# Patient Record
Sex: Female | Born: 1935 | Race: Black or African American | Hispanic: No | Marital: Single | Smoking: Never smoker
Health system: Southern US, Community
[De-identification: ages and names within clinical notes are randomized; demographics above are authoritative.]

## PROBLEM LIST (undated history)

## (undated) DIAGNOSIS — I5032 Chronic diastolic (congestive) heart failure: Secondary | ICD-10-CM

## (undated) DIAGNOSIS — I272 Pulmonary hypertension, unspecified: Secondary | ICD-10-CM

## (undated) DIAGNOSIS — E876 Hypokalemia: Secondary | ICD-10-CM

## (undated) DIAGNOSIS — D638 Anemia in other chronic diseases classified elsewhere: Secondary | ICD-10-CM

## (undated) DIAGNOSIS — C50919 Malignant neoplasm of unspecified site of unspecified female breast: Secondary | ICD-10-CM

## (undated) DIAGNOSIS — E785 Hyperlipidemia, unspecified: Secondary | ICD-10-CM

## (undated) DIAGNOSIS — R600 Localized edema: Secondary | ICD-10-CM

## (undated) DIAGNOSIS — I10 Essential (primary) hypertension: Secondary | ICD-10-CM

## (undated) DIAGNOSIS — I482 Chronic atrial fibrillation, unspecified: Secondary | ICD-10-CM

## (undated) DIAGNOSIS — C50219 Malignant neoplasm of upper-inner quadrant of unspecified female breast: Secondary | ICD-10-CM

## (undated) DIAGNOSIS — E040 Nontoxic diffuse goiter: Secondary | ICD-10-CM

## (undated) DIAGNOSIS — E039 Hypothyroidism, unspecified: Secondary | ICD-10-CM

## (undated) DIAGNOSIS — E559 Vitamin D deficiency, unspecified: Secondary | ICD-10-CM

## (undated) DIAGNOSIS — N184 Chronic kidney disease, stage 4 (severe): Secondary | ICD-10-CM

## (undated) DIAGNOSIS — IMO0002 Reserved for concepts with insufficient information to code with codable children: Secondary | ICD-10-CM

## (undated) DIAGNOSIS — K649 Unspecified hemorrhoids: Secondary | ICD-10-CM

## (undated) DIAGNOSIS — M1990 Unspecified osteoarthritis, unspecified site: Secondary | ICD-10-CM

## (undated) HISTORY — PX: BREAST LUMPECTOMY: SHX2

## (undated) HISTORY — PX: CARDIOVERSION: SHX1299

## (undated) HISTORY — DX: Malignant neoplasm of upper-inner quadrant of unspecified female breast: C50.219

## (undated) HISTORY — DX: Hypothyroidism, unspecified: E03.9

## (undated) HISTORY — DX: Chronic atrial fibrillation, unspecified: I48.20

## (undated) HISTORY — DX: Hyperlipidemia, unspecified: E78.5

## (undated) HISTORY — DX: Unspecified hemorrhoids: K64.9

## (undated) HISTORY — DX: Pulmonary hypertension, unspecified: I27.20

## (undated) HISTORY — DX: Chronic diastolic (congestive) heart failure: I50.32

## (undated) HISTORY — DX: Chronic kidney disease, stage 4 (severe): N18.4

## (undated) HISTORY — DX: Nontoxic diffuse goiter: E04.0

## (undated) HISTORY — DX: Essential (primary) hypertension: I10

## (undated) HISTORY — DX: Unspecified osteoarthritis, unspecified site: M19.90

## (undated) HISTORY — DX: Malignant neoplasm of unspecified site of unspecified female breast: C50.919

## (undated) HISTORY — DX: Anemia in other chronic diseases classified elsewhere: D63.8

## (undated) HISTORY — DX: Vitamin D deficiency, unspecified: E55.9

## (undated) HISTORY — DX: Reserved for concepts with insufficient information to code with codable children: IMO0002

## (undated) HISTORY — DX: Hypokalemia: E87.6

## (undated) HISTORY — DX: Localized edema: R60.0

---

## 1958-06-25 DIAGNOSIS — I10 Essential (primary) hypertension: Secondary | ICD-10-CM

## 1958-06-25 HISTORY — DX: Essential (primary) hypertension: I10

## 1978-06-25 DIAGNOSIS — IMO0002 Reserved for concepts with insufficient information to code with codable children: Secondary | ICD-10-CM

## 1978-06-25 HISTORY — DX: Reserved for concepts with insufficient information to code with codable children: IMO0002

## 1988-06-25 DIAGNOSIS — M1990 Unspecified osteoarthritis, unspecified site: Secondary | ICD-10-CM

## 1988-06-25 HISTORY — DX: Unspecified osteoarthritis, unspecified site: M19.90

## 2004-04-29 ENCOUNTER — Ambulatory Visit: Payer: Self-pay | Admitting: Internal Medicine

## 2005-01-24 ENCOUNTER — Ambulatory Visit: Payer: Self-pay | Admitting: Obstetrics and Gynecology

## 2006-02-04 ENCOUNTER — Ambulatory Visit: Payer: Self-pay | Admitting: Family

## 2006-07-15 ENCOUNTER — Ambulatory Visit: Payer: Self-pay | Admitting: Internal Medicine

## 2007-04-10 ENCOUNTER — Ambulatory Visit: Payer: Self-pay | Admitting: Internal Medicine

## 2008-01-23 ENCOUNTER — Ambulatory Visit (HOSPITAL_COMMUNITY): Admission: RE | Admit: 2008-01-23 | Discharge: 2008-01-23 | Payer: Self-pay | Admitting: *Deleted

## 2008-04-12 ENCOUNTER — Ambulatory Visit: Payer: Self-pay | Admitting: Internal Medicine

## 2008-04-15 ENCOUNTER — Ambulatory Visit: Payer: Self-pay | Admitting: Internal Medicine

## 2008-04-16 ENCOUNTER — Ambulatory Visit: Payer: Self-pay | Admitting: Internal Medicine

## 2008-04-28 ENCOUNTER — Ambulatory Visit: Payer: Self-pay | Admitting: Internal Medicine

## 2008-07-12 ENCOUNTER — Ambulatory Visit: Payer: Self-pay | Admitting: Gastroenterology

## 2008-10-28 ENCOUNTER — Ambulatory Visit: Payer: Self-pay | Admitting: Internal Medicine

## 2009-06-25 HISTORY — PX: COLONOSCOPY: SHX174

## 2010-06-25 DIAGNOSIS — C50919 Malignant neoplasm of unspecified site of unspecified female breast: Secondary | ICD-10-CM

## 2010-06-25 HISTORY — PX: CARDIAC CATHETERIZATION: SHX172

## 2010-06-25 HISTORY — DX: Malignant neoplasm of unspecified site of unspecified female breast: C50.919

## 2010-06-25 HISTORY — PX: BREAST LUMPECTOMY: SHX2

## 2010-07-12 ENCOUNTER — Ambulatory Visit: Payer: Self-pay | Admitting: Cardiovascular Disease

## 2010-09-27 ENCOUNTER — Ambulatory Visit: Payer: Self-pay | Admitting: Internal Medicine

## 2010-10-03 ENCOUNTER — Ambulatory Visit: Payer: Self-pay | Admitting: Internal Medicine

## 2010-11-06 ENCOUNTER — Ambulatory Visit: Payer: Self-pay | Admitting: General Surgery

## 2010-11-07 ENCOUNTER — Ambulatory Visit: Payer: Self-pay | Admitting: General Surgery

## 2010-11-07 NOTE — Op Note (Signed)
Teresa Lutz, Teresa Lutz              ACCOUNT NO.:  [id number]   MEDICAL RECORD NO.:  [id number]          PATIENT TYPE:  AMB   LOCATION:  SDS                          FACILITY:  [facility]   PHYSICIAN:  Donnel Saxon, MD    DATE OF BIRTH:  1935/04/13   DATE OF PROCEDURE:  01/23/2008  DATE OF DISCHARGE:  01/23/2008                               OPERATIVE REPORT   PREOPERATIVE DIAGNOSIS:  Retained lens material right eye.   POSTOPERATIVE DIAGNOSIS:  Retained lens material right eye.   PROCEDURES:  1. Pars plana vitrectomy with pars plana lensectomy right eye.  2. Peripheral laser demarcation, peripheral axis of retinal detachment      right eye.   SURGEON:  Barbara Cower B. Allyne Gee, MD   ANESTHESIA:  Local MAC and retrobulbar injection in the right orbit.   FINDINGS:  There was retained lens material on the vitreous cavity of  the right eye.   SPECIMENS:  None.   COMPLICATIONS:  None.   DESCRIPTION OF THE PROCEDURE:  The patient was identified in the  preoperative holding area, and she was then taken to the operating room  where she was sedated by anesthesia.  At that point of time, the right  orbit was anesthetized using retrobulbar injection of 1:1 mixture of  0.75% bupivacaine and 1% lidocaine with 150 units of Amphadase.  After  excellent akinesia and anesthesia were obtained of the right eye, the  right eye was prepped and draped in usual sterile fashion for ocular  surgery including trimming of lashes. A Lieberman speculum was placed  between the right upper and lower eyelids for exposure.  A conjunctival  peritomy was performed with 0.3 forceps and Westcott scissors to prepare  the eye for stated 3-port 20-gauge vitrectomy.  Hemostasis was obtained  with bipolar cautery.  A Cryoptor was set at 3.5 mm and used to mark the  locations of sclerotomies in the inferior temple, superior temple, and  superior nasal quadrants.  A 7-0 Vicryl suture was placed in the  inferior temporal  sclerotomy location to prepare the eyelid to  support  an infusion cannula.  An microvitreoretinal blade was used to create a  sclerotomy in the inferior temporal oblique quadrant and the MVR blade  was then removed.  The infusion cannula was then placed in this location  and verified to be in the vitreous cavity prior to its use. It was  secured with a prior placed 7-0 Vicryl suture.  Superior sclerotomies  were also created in the MVR blade.  A core vitrectomy was then  performed with a vitreous cutter and a light pipe.  The BIOM was used to  visualize the vitreous during this dissection.  The hyaloid was elevated  and removed.  Peripheral lens material was then removed using the  Fragmatome.  After all lens material was removed, the eye was inspected  with an indirect ophthalmoscopy.  No holes, tears, or subretinal fluid  were seen; however to reduce the incidence of rhegmatogenous disease,  the peripheral retina was treated with laser demarcation.  At that time,  with no other  problems being seen, all instruments were removed from the  eye and the sclerotomies were closed with 7-0 Vicryl suture in a figure-  of-eight knot.  The infusion cannula was removed and this sclerotomy was  closed in similar fashion.  Tenon capsule and conjunctiva were then  reapproximated to the limbus using 7-0 Vicryl suture in an interrupted  fashion.  The eye was then treated with subconjunctival injections of 4  mg of Decadron and 25 mg of Ancef.  The eye was then treated  with 1 drop of 1% atropine and TobraDex ointment.  The speculum were  removed.  The eyelids were cleaned and closed.  The eye was then patched  and shielded.  The patient was taken to the recovery in excellent  condition having tolerated the procedure very well.      Donnel Saxon, MD  Electronically Signed     JBS/MEDQ  D:  02/27/2008  T:  02/28/2008  Job:  [id number]

## 2010-11-20 ENCOUNTER — Ambulatory Visit: Payer: Self-pay | Admitting: Radiation Oncology

## 2010-11-24 ENCOUNTER — Ambulatory Visit: Payer: Self-pay | Admitting: Radiation Oncology

## 2010-11-24 HISTORY — PX: OTHER SURGICAL HISTORY: SHX169

## 2010-11-29 ENCOUNTER — Ambulatory Visit: Payer: Self-pay | Admitting: Radiation Oncology

## 2010-12-24 ENCOUNTER — Ambulatory Visit: Payer: Self-pay | Admitting: Radiation Oncology

## 2011-03-23 LAB — BASIC METABOLIC PANEL
BUN: 33 — ABNORMAL HIGH
Creatinine, Ser: 1.8 — ABNORMAL HIGH
GFR calc non Af Amer: 28 — ABNORMAL LOW
Glucose, Bld: 107 — ABNORMAL HIGH
Potassium: 3.3 — ABNORMAL LOW

## 2011-03-23 LAB — CBC
HCT: 32.9 — ABNORMAL LOW
MCV: 90
Platelets: 231
RDW: 13.9
WBC: 6.1

## 2011-05-09 ENCOUNTER — Ambulatory Visit: Payer: Self-pay | Admitting: General Surgery

## 2011-05-26 ENCOUNTER — Ambulatory Visit: Payer: Self-pay | Admitting: Radiation Oncology

## 2011-06-26 ENCOUNTER — Ambulatory Visit: Payer: Self-pay | Admitting: Radiation Oncology

## 2011-12-03 ENCOUNTER — Ambulatory Visit: Payer: Self-pay | Admitting: General Surgery

## 2011-12-12 ENCOUNTER — Ambulatory Visit: Payer: Self-pay | Admitting: Radiation Oncology

## 2011-12-24 ENCOUNTER — Ambulatory Visit: Payer: Self-pay | Admitting: Radiation Oncology

## 2012-01-21 ENCOUNTER — Other Ambulatory Visit: Payer: Self-pay | Admitting: Cardiology

## 2012-01-21 DIAGNOSIS — I2510 Atherosclerotic heart disease of native coronary artery without angina pectoris: Secondary | ICD-10-CM

## 2012-01-23 ENCOUNTER — Other Ambulatory Visit: Payer: Self-pay

## 2012-01-23 ENCOUNTER — Other Ambulatory Visit (INDEPENDENT_AMBULATORY_CARE_PROVIDER_SITE_OTHER): Payer: Medicare Other

## 2012-01-23 DIAGNOSIS — R079 Chest pain, unspecified: Secondary | ICD-10-CM

## 2012-01-23 DIAGNOSIS — I2510 Atherosclerotic heart disease of native coronary artery without angina pectoris: Secondary | ICD-10-CM

## 2012-01-25 ENCOUNTER — Encounter (HOSPITAL_COMMUNITY): Payer: Self-pay | Admitting: Internal Medicine

## 2012-05-28 ENCOUNTER — Ambulatory Visit: Payer: Self-pay | Admitting: General Surgery

## 2012-06-12 ENCOUNTER — Ambulatory Visit: Payer: Self-pay | Admitting: Radiation Oncology

## 2012-06-25 ENCOUNTER — Ambulatory Visit: Payer: Self-pay | Admitting: Radiation Oncology

## 2012-06-25 DIAGNOSIS — I482 Chronic atrial fibrillation, unspecified: Secondary | ICD-10-CM

## 2012-06-25 HISTORY — DX: Chronic atrial fibrillation, unspecified: I48.20

## 2012-10-24 ENCOUNTER — Encounter: Payer: Self-pay | Admitting: General Surgery

## 2012-12-10 ENCOUNTER — Ambulatory Visit: Payer: Self-pay | Admitting: General Surgery

## 2012-12-11 ENCOUNTER — Ambulatory Visit: Payer: Self-pay | Admitting: General Surgery

## 2012-12-16 ENCOUNTER — Encounter: Payer: Self-pay | Admitting: General Surgery

## 2012-12-29 ENCOUNTER — Ambulatory Visit: Payer: Self-pay | Admitting: General Surgery

## 2013-01-13 ENCOUNTER — Encounter: Payer: Self-pay | Admitting: *Deleted

## 2013-01-29 ENCOUNTER — Ambulatory Visit (INDEPENDENT_AMBULATORY_CARE_PROVIDER_SITE_OTHER): Payer: Medicare Other | Admitting: General Surgery

## 2013-01-29 ENCOUNTER — Encounter: Payer: Self-pay | Admitting: General Surgery

## 2013-01-29 VITALS — BP 170/84 | HR 78 | Resp 14 | Ht 67.0 in | Wt 217.0 lb

## 2013-01-29 DIAGNOSIS — Z853 Personal history of malignant neoplasm of breast: Secondary | ICD-10-CM

## 2013-01-29 NOTE — Patient Instructions (Addendum)
Patient to continued self breast exam and letrozole.

## 2013-01-29 NOTE — Progress Notes (Signed)
Patient ID: Teresa Lutz, female   DOB: Jun 19, 1935, 78 y.o.   MRN: [id number]  Chief Complaint  Patient presents with  . Breast Cancer Long Term Follow Up    HPI Teresa Lutz is a 78 y.o. female who presents for a breast evaluation. The most recent mammogram was done on 12/10/12 with a birad category 2 Patient does perform regular self breast checks and gets regular mammograms done.     HPI  Past Medical History  Diagnosis Date  . Arthritis 1990  . Breast cancer 2012    Right stage 1  . Hypertension 1960  . Ulcer 1980  . Edema   . Lump or mass in breast     right  . Malignant neoplasm of upper-inner quadrant of female breast     right  . Hemorrhoid     Past Surgical History  Procedure Laterality Date  . Mammosite balloon placement  Right 11/2010  . Colonoscopy  2011  . Breast lumpectomy Right 50 years ago  . Breast lumpectomy Right 2012    invasive     Family History  Problem Relation Age of Onset  . Cancer Mother     ovarian  . Cancer Brother     colon    Social History History  Substance Use Topics  . Smoking status: Never Smoker   . Smokeless tobacco: Never Used  . Alcohol Use: No    Allergies  Allergen Reactions  . Celebrex (Celecoxib) Hives  . Other Hives    Arthritis meds    Current Outpatient Prescriptions  Medication Sig Dispense Refill  . aspirin 81 MG tablet Take 81 mg by mouth daily.      [name] BENICAR HCT 40-12.5 MG per tablet Take 1 tablet by mouth daily.      . Calcium Carbonate-Vitamin D (CALCIUM + D PO) Take 1 tablet by mouth 2 (two) times daily.      . ferrous sulfate 325 (65 FE) MG tablet Take 325 mg by mouth daily with breakfast.      . furosemide (LASIX) 40 MG tablet Take 1 tablet by mouth daily.      [name] K-TAB 10 MEQ tablet Take 1 tablet by mouth 2 (two) times daily.      [name] letrozole (FEMARA) 2.5 MG tablet Take 1 tablet by mouth daily.      [name] levothyroxine (SYNTHROID, LEVOTHROID) 25 MCG tablet Take 1 tablet by mouth daily.      .  metoprolol succinate (TOPROL-XL) 50 MG 24 hr tablet Take 0.5 tablets by mouth daily.      . rosuvastatin (CRESTOR) 10 MG tablet Take 10 mg by mouth daily.       No current facility-administered medications for this visit.    Review of Systems Review of Systems  Constitutional: Negative.   Respiratory: Negative.   Cardiovascular: Negative.     Blood pressure 170/84, pulse 78, resp. rate 14, height 5\' 7"  (1.702 m), weight 217 lb (98.431 kg).  Physical Exam Physical Exam  Constitutional: She is oriented to person, place, and time. She appears well-developed and well-nourished.  Eyes: Conjunctivae are normal. No scleral icterus.  Neck: Neck supple. No mass and no thyromegaly present.  Cardiovascular: Normal rate, regular rhythm and normal heart sounds.   Pulses:      Dorsalis pedis pulses are 2+ on the right side, and 2+ on the left side.       Posterior tibial pulses are 2+ on the right side, and 2+ on the  left side.  moderatel edema is both legs. This is chronic.   Pulmonary/Chest: Breath sounds normal. Right breast exhibits no inverted nipple, no mass, no nipple discharge, no skin change and no tenderness. Left breast exhibits no inverted nipple, no mass, no nipple discharge, no skin change and no tenderness.    Abdominal: Soft. Bowel sounds are normal. There is no hepatosplenomegaly. There is no tenderness. No hernia.  Lymphadenopathy:    She has no cervical adenopathy.    She has no axillary adenopathy.  Neurological: She is alert and oriented to person, place, and time.  Skin: Skin is warm and dry.    Data Reviewed Mammogram reviewed   Assessment   Stable exam 2 years post right breast cancer treatment.     Plan   Patient to return in one year bilateral diagnotic mammogram.       SANKAR,SEEPLAPUTHUR G 01/30/2013, 5:47 AM

## 2013-01-30 ENCOUNTER — Encounter: Payer: Self-pay | Admitting: General Surgery

## 2013-05-12 ENCOUNTER — Ambulatory Visit (INDEPENDENT_AMBULATORY_CARE_PROVIDER_SITE_OTHER): Payer: Medicare Other | Admitting: Cardiovascular Disease

## 2013-05-12 ENCOUNTER — Encounter: Payer: Self-pay | Admitting: Cardiovascular Disease

## 2013-05-12 ENCOUNTER — Encounter: Payer: Self-pay | Admitting: *Deleted

## 2013-05-12 VITALS — BP 152/98 | HR 116 | Ht 67.0 in | Wt 211.5 lb

## 2013-05-12 DIAGNOSIS — I10 Essential (primary) hypertension: Secondary | ICD-10-CM

## 2013-05-12 DIAGNOSIS — R0602 Shortness of breath: Secondary | ICD-10-CM

## 2013-05-12 DIAGNOSIS — I4891 Unspecified atrial fibrillation: Secondary | ICD-10-CM

## 2013-05-12 MED ORDER — METOPROLOL TARTRATE 25 MG PO TABS: 25.0000 mg | ORAL_TABLET | Freq: Two times a day (BID) | ORAL | Status: DC

## 2013-05-12 MED ORDER — APIXABAN 2.5 MG PO TABS: 2.5000 mg | ORAL_TABLET | Freq: Two times a day (BID) | ORAL | Status: DC

## 2013-05-12 NOTE — Progress Notes (Signed)
Primary care physician: Dr. Dario Guardian  HPI  This is a 78 year old African American female who was referred by Dr. Dario Guardian for evaluation and management of recently diagnosed atrial fibrillation. She had cardiac catheterization done in 2012 which showed mild nonobstructive coronary artery disease. She has chronic medical conditions that include type 2 diabetes, hypertension, hyperlipidemia, chronic kidney disease, anemia of chronic disease, hypothyroidism and history of breast cancer. She was evaluated recently for exertional dyspnea with a nuclear stress test which showed no evidence of ischemia with normal ejection fraction. She was noted to be in atrial fibrillation with a baseline heart rate of 106 beats per minute which quickly increased to 168 beats per minute after only one and a half minutes. She is not aware of previous history of atrial fibrillation. She complains of dizziness but no palpitations. She has chronic exertional dyspnea but currently with no chest pain. She denies any previous bleeding complications. No previous stroke . She used to be on metoprolol for hypertension but she was taken off this medication.   Allergies  Allergen Reactions  . Celebrex [Celecoxib] Hives  . Other Hives    Arthritis meds     Current Outpatient Prescriptions on File Prior to Visit  Medication Sig Dispense Refill  . aspirin 81 MG tablet Take 81 mg by mouth daily.      [name] BENICAR HCT 40-12.5 MG per tablet Take 1 tablet by mouth daily.      . Calcium Carbonate-Vitamin D (CALCIUM + D PO) Take 1 tablet by mouth 2 (two) times daily.      . ferrous sulfate 325 (65 FE) MG tablet Take 325 mg by mouth daily with breakfast.      . furosemide (LASIX) 40 MG tablet Take 1 tablet by mouth daily.      [name] K-TAB 10 MEQ tablet Take 1 tablet by mouth 2 (two) times daily.      [name] levothyroxine (SYNTHROID, LEVOTHROID) 25 MCG tablet Take 1 tablet by mouth daily.       No current facility-administered medications on file  prior to visit.     Past Medical History  Diagnosis Date  . Arthritis 1990  . Breast cancer 2012    Right stage 1  . Ulcer 1980  . Edema   . Lump or mass in breast     right  . Malignant neoplasm of upper-inner quadrant of female breast     right  . Hemorrhoid   . Unspecified vitamin D deficiency   . Iron deficiency anemia, unspecified   . Hypopotassemia   . Unspecified hypothyroidism   . Goiter, specified as simple   . Unspecified disorder of kidney and ureter   . Edema   . Hyperlipidemia   . Chronic kidney disease     Most recent creatinine was 1.82  . Hypertension 1960     Past Surgical History  Procedure Laterality Date  . Mammosite balloon placement  Right 11/2010  . Colonoscopy  2011  . Breast lumpectomy Right 50 years ago  . Breast lumpectomy Right 2012    invasive   . Cardiac catheterization  06/2010    [facility]: Mild nonobstructive coronary artery disease with normal ejection fraction.     Family History  Problem Relation Age of Onset  . Cancer Mother     ovarian  . Hypertension Mother   . Cancer Brother     colon  . Stroke Father      History   Social History  . Marital  Status: Single    Spouse Name: N/A    Number of Children: N/A  . Years of Education: N/A   Occupational History  . Not on file.   Social History Main Topics  . Smoking status: Never Smoker   . Smokeless tobacco: Never Used  . Alcohol Use: No  . Drug Use: No  . Sexual Activity: Not on file   Other Topics Concern  . Not on file   Social History Narrative  . No narrative on file     ROS A 10 point review of system was performed. It is negative other than that mentioned in the history of present illness.   PHYSICAL EXAM   BP 152/98  Pulse 116  Ht 5\' 7"  (1.702 m)  Wt 211 lb 8 oz (95.936 kg)  BMI 33.12 kg/m2 Constitutional: She is oriented to person, place, and time. She appears well-developed and well-nourished. No distress.  HENT: No nasal discharge.  Head:  Normocephalic and atraumatic.  Eyes: Pupils are equal and round. No discharge.  Neck: Normal range of motion. Neck supple. No JVD present. No thyromegaly present.  Cardiovascular: Tachycardic, irregular rhythm, normal heart sounds. Exam reveals no gallop and no friction rub. No murmur heard.  Pulmonary/Chest: Effort normal and breath sounds normal. No stridor. No respiratory distress. She has no wheezes. She has no rales. She exhibits no tenderness.  Abdominal: Soft. Bowel sounds are normal. She exhibits no distension. There is no tenderness. There is no rebound and no guarding.  Musculoskeletal: Normal range of motion. She exhibits trace edema and no tenderness.  Neurological: She is alert and oriented to person, place, and time. Coordination normal.  Skin: Skin is warm and dry. No rash noted. She is not diaphoretic. No erythema. No pallor.  Psychiatric: She has a normal mood and affect. Her behavior is normal. Judgment and thought content normal.     ZOX:WRUEAV fibrillation with rapid ventricular response -Nonspecific ST depression  -Nondiagnostic.   ABNORMAL    ASSESSMENT AND PLAN

## 2013-05-12 NOTE — Assessment & Plan Note (Signed)
Blood pressure is elevated. Hopefully this will improve with the addition of metoprolol.

## 2013-05-12 NOTE — Assessment & Plan Note (Signed)
New diagnosed atrial fibrillation with rapid ventricular response. Symptoms include dizziness and dyspnea but no palpitations or chest pain. I recommend attempting rate control and thus I started metoprolol tartrate 25 mg twice daily. Her CHADS VASc score is 5 and thus she is at high risk for thromboembolic complications related to A. fib. I had a prolonged discussion with her about the risks and benefits of anticoagulation. There is no contraindication for anticoagulation. Thus, I started her on Eliquis 2.5 mg twice daily. Most recent creatinine was 1.82. Technically, the correct doses 5 mg twice daily. However, I elected to start on a smaller dose for now due to baseline anemia. Once I insure stability on this dose, the plan is to increase to 5 mg twice daily. She had a recent stress test that showed no evidence of ischemia with normal ejection fraction. Echocardiogram last year was overall unremarkable with only atrial septal aneurysm.

## 2013-05-12 NOTE — Patient Instructions (Signed)
Your physician has recommended you make the following change in your medication:   1. Start Metoprolol tartrate 25mg  twice daily  2. Eliquis 2.5mg  twice daily  Your physician recommends that you schedule a follow-up appointment in: 2 weeks with Dr Kirke Corin

## 2013-05-13 ENCOUNTER — Telehealth: Payer: Self-pay

## 2013-05-13 NOTE — Telephone Encounter (Signed)
Attempted to call pt back.  No answer, voice mailbox not set up yet.

## 2013-05-13 NOTE — Telephone Encounter (Signed)
Pt called and wants to know if she should keep taking the aspirin with the blood thinner. Please call.

## 2013-05-19 NOTE — Telephone Encounter (Signed)
routing

## 2013-05-19 NOTE — Telephone Encounter (Signed)
Stop Aspirin

## 2013-05-19 NOTE — Telephone Encounter (Signed)
Attempted to call the patient. Voice mail not set up.

## 2013-05-19 NOTE — Addendum Note (Signed)
Addended by: Sherri Rad C on: 05/19/2013 03:29 PM   Modules accepted: Orders, Medications

## 2013-05-19 NOTE — Telephone Encounter (Signed)
The patient is aware to stop aspirin.

## 2013-05-19 NOTE — Telephone Encounter (Signed)
Dr. Kirke Corin, I didn't see where you had instructed her to come off aspirin while initiating Eliquis, just wanted to make sure you still want her to continue aspirin. Thanks

## 2013-05-29 ENCOUNTER — Ambulatory Visit (INDEPENDENT_AMBULATORY_CARE_PROVIDER_SITE_OTHER): Payer: Medicare Other | Admitting: Cardiovascular Disease

## 2013-05-29 ENCOUNTER — Encounter: Payer: Self-pay | Admitting: Cardiovascular Disease

## 2013-05-29 VITALS — BP 183/101 | HR 76 | Ht 67.0 in | Wt 218.5 lb

## 2013-05-29 DIAGNOSIS — R000 Tachycardia, unspecified: Secondary | ICD-10-CM

## 2013-05-29 DIAGNOSIS — I4891 Unspecified atrial fibrillation: Secondary | ICD-10-CM

## 2013-05-29 DIAGNOSIS — I10 Essential (primary) hypertension: Secondary | ICD-10-CM

## 2013-05-29 MED ORDER — CARVEDILOL 12.5 MG PO TABS: 12.5000 mg | ORAL_TABLET | Freq: Two times a day (BID) | ORAL | Status: DC

## 2013-05-29 NOTE — Assessment & Plan Note (Signed)
Blood pressure is very elevated. I switched metoprolol to carvedilol 12.5 mg twice daily as outlined above. Continue other medications. The plan is to ultimately up titrate this to 25 mg daily if tolerated.

## 2013-05-29 NOTE — Assessment & Plan Note (Signed)
She is now in normal sinus rhythm. Continue long-term anticoagulation. I will check labs in one month and if stable increase the dose of Eliquis to 5 mg twice daily. On switching metoprolol to carvedilol in order to better control blood pressure.

## 2013-05-29 NOTE — Patient Instructions (Signed)
Stop Metoprolol.  Start Carvedilol 12.5 mg twice daily.   Follow up in 1 month.

## 2013-05-29 NOTE — Progress Notes (Signed)
Primary care physician: Dr. Dario Guardian  HPI  This is a 78 year old African American female who is here today for followup visit regarding paroxysmal atrial fibrillation. She had cardiac catheterization done in 2012 which showed mild nonobstructive coronary artery disease. She has chronic medical conditions that include type 2 diabetes, hypertension, hyperlipidemia, chronic kidney disease, anemia of chronic disease, hypothyroidism and history of breast cancer. She was evaluated recently for exertional dyspnea with a nuclear stress test which showed no evidence of ischemia with normal ejection fraction. She was noted to be in atrial fibrillation with a baseline heart rate of 106 beats per minute which quickly increased to 168 beats per minute after only one and a half minutes. I started her on metoprolol 25 mg twice daily as well as anticoagulation with Eliquis 2.5 mg twice daily. She does have chronic kidney disease and mild anemia. She has been feeling better with less palpitations. She had one episode on Monday which lasted for about one hour and was associated with mild chest discomfort.  Allergies  Allergen Reactions  . Celebrex [Celecoxib] Hives  . Other Hives    Arthritis meds     Current Outpatient Prescriptions on File Prior to Visit  Medication Sig Dispense Refill  . amLODipine (NORVASC) 5 MG tablet Take 5 mg by mouth daily.      [name] apixaban (ELIQUIS) 2.5 MG TABS tablet Take 1 tablet (2.5 mg total) by mouth 2 (two) times daily.  60 tablet  6  . BENICAR HCT 40-12.5 MG per tablet Take 1 tablet by mouth daily.      . Calcium Carbonate-Vitamin D (CALCIUM + D PO) Take 1 tablet by mouth 2 (two) times daily.      . ferrous sulfate 325 (65 FE) MG tablet Take 325 mg by mouth daily with breakfast.      . furosemide (LASIX) 40 MG tablet Take 1 tablet by mouth daily.      [name] K-TAB 10 MEQ tablet Take 1 tablet by mouth 2 (two) times daily.      . metoprolol tartrate (LOPRESSOR) 25 MG tablet Take 1  tablet (25 mg total) by mouth 2 (two) times daily.  60 tablet  6  . levothyroxine (SYNTHROID, LEVOTHROID) 25 MCG tablet Take 1 tablet by mouth daily.       No current facility-administered medications on file prior to visit.     Past Medical History  Diagnosis Date  . Arthritis 1990  . Breast cancer 2012    Right stage 1  . Ulcer 1980  . Edema   . Lump or mass in breast     right  . Malignant neoplasm of upper-inner quadrant of female breast     right  . Hemorrhoid   . Unspecified vitamin D deficiency   . Iron deficiency anemia, unspecified   . Hypopotassemia   . Unspecified hypothyroidism   . Goiter, specified as simple   . Unspecified disorder of kidney and ureter   . Edema   . Hyperlipidemia   . Chronic kidney disease     Most recent creatinine was 1.82  . Hypertension 1960     Past Surgical History  Procedure Laterality Date  . Mammosite balloon placement  Right 11/2010  . Colonoscopy  2011  . Breast lumpectomy Right 50 years ago  . Breast lumpectomy Right 2012    invasive   . Cardiac catheterization  06/2010    [facility]: Mild nonobstructive coronary artery disease with normal ejection fraction.  Family History  Problem Relation Age of Onset  . Cancer Mother     ovarian  . Hypertension Mother   . Cancer Brother     colon  . Stroke Father      History   Social History  . Marital Status: Single    Spouse Name: N/A    Number of Children: N/A  . Years of Education: N/A   Occupational History  . Not on file.   Social History Main Topics  . Smoking status: Never Smoker   . Smokeless tobacco: Never Used  . Alcohol Use: No  . Drug Use: No  . Sexual Activity: Not on file   Other Topics Concern  . Not on file   Social History Narrative  . No narrative on file     ROS A 10 point review of system was performed. It is negative other than that mentioned in the history of present illness.   PHYSICAL EXAM   BP 183/101  Pulse 76  Ht 5\' 7"   (1.702 m)  Wt 218 lb 8 oz (99.111 kg)  BMI 34.21 kg/m2 Constitutional: She is oriented to person, place, and time. She appears well-developed and well-nourished. No distress.  HENT: No nasal discharge.  Head: Normocephalic and atraumatic.  Eyes: Pupils are equal and round. No discharge.  Neck: Normal range of motion. Neck supple. No JVD present. No thyromegaly present.  Cardiovascular: Regular rhythm, normal heart sounds. Exam reveals no gallop and no friction rub. No murmur heard.  Pulmonary/Chest: Effort normal and breath sounds normal. No stridor. No respiratory distress. She has no wheezes. She has no rales. She exhibits no tenderness.  Abdominal: Soft. Bowel sounds are normal. She exhibits no distension. There is no tenderness. There is no rebound and no guarding.  Musculoskeletal: Normal range of motion. She exhibits trace edema and no tenderness.  Neurological: She is alert and oriented to person, place, and time. Coordination normal.  Skin: Skin is warm and dry. No rash noted. She is not diaphoretic. No erythema. No pallor.  Psychiatric: She has a normal mood and affect. Her behavior is normal. Judgment and thought content normal.     EKG: Normal sinus rhythm with T wave changes in the inferior leads  ASSESSMENT AND PLAN

## 2013-06-11 ENCOUNTER — Ambulatory Visit: Payer: Self-pay | Admitting: Radiation Oncology

## 2013-06-25 ENCOUNTER — Ambulatory Visit: Payer: Self-pay | Admitting: Radiation Oncology

## 2013-07-02 ENCOUNTER — Encounter: Payer: Self-pay | Admitting: Cardiovascular Disease

## 2013-07-02 ENCOUNTER — Ambulatory Visit (INDEPENDENT_AMBULATORY_CARE_PROVIDER_SITE_OTHER): Payer: Medicare PPO | Admitting: Cardiovascular Disease

## 2013-07-02 VITALS — BP 168/102 | HR 109 | Ht 67.0 in | Wt 222.0 lb

## 2013-07-02 DIAGNOSIS — I10 Essential (primary) hypertension: Secondary | ICD-10-CM

## 2013-07-02 DIAGNOSIS — I4891 Unspecified atrial fibrillation: Secondary | ICD-10-CM

## 2013-07-02 MED ORDER — CARVEDILOL 25 MG PO TABS: 25.0000 mg | ORAL_TABLET | Freq: Two times a day (BID) | ORAL | Status: DC

## 2013-07-02 NOTE — Assessment & Plan Note (Signed)
She is currently in atrial fibrillation and mildly tachycardic. I recommend increasing the dose of carvedilol to 25 mg twice daily. Continue anticoagulation with Eliquis. I will check basic metabolic profile and CBC. If her labs are stable, I will plan on increasing the dose to 5 mg twice daily.

## 2013-07-02 NOTE — Patient Instructions (Signed)
Labs today.   Increase Carvedilol to 25 mg twice daily.   Follow up in 1 month.

## 2013-07-02 NOTE — Progress Notes (Signed)
Primary care physician: Dr. Rosario Jacks  HPI  [name] who is here today for followup visit regarding paroxysmal atrial fibrillation. She had cardiac catheterization done in 2012 which showed mild nonobstructive coronary artery disease. She has chronic medical conditions that include type 2 diabetes, hypertension, hyperlipidemia, chronic kidney disease, anemia of chronic disease, hypothyroidism and history of breast cancer. She was evaluated recently for exertional dyspnea with a nuclear stress test which showed no evidence of ischemia with normal ejection fraction. She was noted to be in atrial fibrillation with a baseline heart rate of 106 beats per minute which quickly increased to 168 beats per minute after only one and a half minutes. I started her on metoprolol 25 mg twice daily as well as anticoagulation with Eliquis 2.5 mg twice daily. She was noted to be in sinus rhythm during her followup visit. However, blood pressure was very elevated. Thus, I switched her from metoprolol to carvedilol. Although she is in atrial fibrillation today, she denies any palpitations. No chest discomfort. She has chronic lower extremity edema.  Allergies  Allergen Reactions  . Celebrex [Celecoxib] Hives  . Other Hives    Arthritis meds     Current Outpatient Prescriptions on File Prior to Visit  Medication Sig Dispense Refill  . amLODipine (NORVASC) 5 MG tablet Take 5 mg by mouth daily.      [name] apixaban (ELIQUIS) 2.5 MG TABS tablet Take 1 tablet (2.5 mg total) by mouth 2 (two) times daily.  60 tablet  6  . BENICAR HCT 40-12.5 MG per tablet Take 1 tablet by mouth daily.      . Calcium Carbonate-Vitamin D (CALCIUM + D PO) Take 1 tablet by mouth 2 (two) times daily.      . ferrous sulfate 325 (65 FE) MG tablet Take 325 mg by mouth daily with breakfast.      . furosemide (LASIX) 40 MG tablet Take 1 tablet by mouth daily.      [name] K-TAB 10 MEQ tablet Take 1 tablet by mouth 2  (two) times daily.      [name] letrozole (FEMARA) 2.5 MG tablet Take 2.5 mg by mouth daily.       [name] levothyroxine (SYNTHROID, LEVOTHROID) 25 MCG tablet Take 1 tablet by mouth daily.       No current facility-administered medications on file prior to visit.     Past Medical History  Diagnosis Date  . Arthritis 1990  . Breast cancer 2012    Right stage 1  . Ulcer 1980  . Edema   . Lump or mass in breast     right  . Malignant neoplasm of upper-inner quadrant of female breast     right  . Hemorrhoid   . Unspecified vitamin D deficiency   . Iron deficiency anemia, unspecified   . Hypopotassemia   . Unspecified hypothyroidism   . Goiter, specified as simple   . Unspecified disorder of kidney and ureter   . Edema   . Hyperlipidemia   . Chronic kidney disease     Most recent creatinine was 1.82  . Hypertension 1960     Past Surgical History  Procedure Laterality Date  . Mammosite balloon placement  Right 11/2010  . Colonoscopy  2011  . Breast lumpectomy Right 50 years ago  . Breast lumpectomy Right 2012    invasive   . Cardiac catheterization  06/2010    [facility]: Mild nonobstructive coronary artery disease with normal ejection fraction.  Family History  Problem Relation Age of Onset  . Cancer Mother     ovarian  . Hypertension Mother   . Cancer Brother     colon  . Stroke Father      History   Social History  . Marital Status: Single    Spouse Name: N/A    Number of Children: N/A  . Years of Education: N/A   Occupational History  . Not on file.   Social History Main Topics  . Smoking status: Never Smoker   . Smokeless tobacco: Never Used  . Alcohol Use: No  . Drug Use: No  . Sexual Activity: Not on file   Other Topics Concern  . Not on file   Social History Narrative  . No narrative on file     ROS A 10 point review of system was performed. It is negative other than that mentioned in the history of present illness.   PHYSICAL EXAM   BP  168/102  Pulse 109  Ht 5\' 7"  (1.702 m)  Wt 222 lb (100.699 kg)  BMI 34.76 kg/m2 Constitutional: She is oriented to person, place, and time. She appears well-developed and well-nourished. No distress.  HENT: No nasal discharge.  Head: Normocephalic and atraumatic.  Eyes: Pupils are equal and round. No discharge.  Neck: Normal range of motion. Neck supple. No JVD present. No thyromegaly present.  Cardiovascular: Iregular rhythm, mildly tachycardic normal heart sounds. Exam reveals no gallop and no friction rub. No murmur heard.  Pulmonary/Chest: Effort normal and breath sounds normal. No stridor. No respiratory distress. She has no wheezes. She has no rales. She exhibits no tenderness.  Abdominal: Soft. Bowel sounds are normal. She exhibits no distension. There is no tenderness. There is no rebound and no guarding.  Musculoskeletal: Normal range of motion. She exhibits +2 edema and no tenderness.  Neurological: She is alert and oriented to person, place, and time. Coordination normal.  Skin: Skin is warm and dry. No rash noted. She is not diaphoretic. No erythema. No pallor.  Psychiatric: She has a normal mood and affect. Her behavior is normal. Judgment and thought content normal.     EKG: Atrial fibrillation with rapid ventricular response.  heart rate is 107 beats per minute.  ASSESSMENT AND PLAN

## 2013-07-02 NOTE — Assessment & Plan Note (Signed)
Blood pressure continues to be elevated. Increase carvedilol to 25 mg twice daily.

## 2013-07-03 LAB — CBC WITH DIFFERENTIAL/PLATELET
BASOS: 0 %
Basophils Absolute: 0 10*3/uL (ref 0.0–0.2)
EOS ABS: 0.2 10*3/uL (ref 0.0–0.4)
Eos: 4 %
HEMATOCRIT: 30.6 % — AB (ref 34.0–46.6)
HEMOGLOBIN: 9.9 g/dL — AB (ref 11.1–15.9)
Immature Grans (Abs): 0 10*3/uL (ref 0.0–0.1)
Immature Granulocytes: 0 %
LYMPHS ABS: 1.2 10*3/uL (ref 0.7–3.1)
Lymphs: 22 %
MCH: 28.4 pg (ref 26.6–33.0)
MCHC: 32.4 g/dL (ref 31.5–35.7)
MCV: 88 fL (ref 79–97)
MONOS ABS: 0.6 10*3/uL (ref 0.1–0.9)
Monocytes: 11 %
NEUTROS ABS: 3.3 10*3/uL (ref 1.4–7.0)
Neutrophils Relative %: 63 %
RBC: 3.49 x10E6/uL — AB (ref 3.77–5.28)
RDW: 14.9 % (ref 12.3–15.4)
WBC: 5.3 10*3/uL (ref 3.4–10.8)

## 2013-07-03 LAB — BASIC METABOLIC PANEL
BUN / CREAT RATIO: 22 (ref 11–26)
BUN: 40 mg/dL — AB (ref 8–27)
CHLORIDE: 104 mmol/L (ref 97–108)
CO2: 24 mmol/L (ref 18–29)
Calcium: 10 mg/dL (ref 8.6–10.2)
Creatinine, Ser: 1.78 mg/dL — ABNORMAL HIGH (ref 0.57–1.00)
GFR calc non Af Amer: 27 mL/min/{1.73_m2} — ABNORMAL LOW (ref >59)
GFR, EST AFRICAN AMERICAN: 31 mL/min/{1.73_m2} — AB (ref >59)
Glucose: 90 mg/dL (ref 65–99)
Potassium: 3.9 mmol/L (ref 3.5–5.2)
Sodium: 144 mmol/L (ref 134–144)

## 2013-08-03 ENCOUNTER — Encounter: Payer: Self-pay | Admitting: Cardiovascular Disease

## 2013-08-03 ENCOUNTER — Ambulatory Visit (INDEPENDENT_AMBULATORY_CARE_PROVIDER_SITE_OTHER): Payer: Medicare PPO | Admitting: Cardiovascular Disease

## 2013-08-03 VITALS — BP 154/133 | HR 114 | Ht 67.0 in | Wt 224.5 lb

## 2013-08-03 DIAGNOSIS — I4891 Unspecified atrial fibrillation: Secondary | ICD-10-CM

## 2013-08-03 DIAGNOSIS — I10 Essential (primary) hypertension: Secondary | ICD-10-CM

## 2013-08-03 MED ORDER — METOPROLOL TARTRATE 50 MG PO TABS
50.0000 mg | ORAL_TABLET | Freq: Two times a day (BID) | ORAL | Status: DC
Start: 2013-08-03 — End: 2013-09-07

## 2013-08-03 NOTE — Assessment & Plan Note (Signed)
Blood pressure continues to be elevated but hopefully this would improve with controlling ventricular rate. She reports compliance with her CPAP machine.

## 2013-08-03 NOTE — Assessment & Plan Note (Signed)
Ventricular rate is still not controlled in spite of maximum dose carvedilol. I recommend switching this to metoprolol 50 mg twice daily. I elected to keep her on a smaller dose Eliquis 2.5 mg twice daily. She is close to 79 years old and has chronic kidney disease. She has baseline anemia. Hemoglobin dropped from 10.9 to 9.9 and thus I did not increase the dose. If she remains tachycardic in spite of maximal dose metoprolol, I might consider adding digoxin as the last resort or switching amlodipine to diltiazem.

## 2013-08-03 NOTE — Patient Instructions (Signed)
Stop taking Carvedilol (Coreg).   Start Metoprolol 50 mg twice daily.  Follow up in 1 month.

## 2013-08-03 NOTE — Progress Notes (Signed)
Primary care physician: Dr. Rosario Jacks  HPI  [name] who is here today for followup visit regarding paroxysmal atrial fibrillation. She had cardiac catheterization done in 2012 which showed mild nonobstructive coronary artery disease. She has chronic medical conditions that include type 2 diabetes, hypertension, hyperlipidemia, chronic kidney disease, anemia of chronic disease, sleep apnea on CPAP, hypothyroidism and history of breast cancer. She was evaluated recently for exertional dyspnea with a nuclear stress test which showed no evidence of ischemia with normal ejection fraction. She was noted to be in atrial fibrillation with a baseline heart rate of 106 beats per minute which quickly increased to 168 beats per minute after only one and a half minutes. I started her on metoprolol 25 mg twice daily as well as anticoagulation with Eliquis 2.5 mg twice daily. She was noted to be in sinus rhythm during her followup visit. However, blood pressure was very elevated. Thus, I switched her from metoprolol to carvedilol. The dose of carvedilol was increased during last visit to 25 mg twice daily. In spite of that, she is still tachycardic. She complains of fatigue and shortness of breath with activities.  Allergies  Allergen Reactions  . Celebrex [Celecoxib] Hives  . Other Hives    Arthritis meds     Current Outpatient Prescriptions on File Prior to Visit  Medication Sig Dispense Refill  . amLODipine (NORVASC) 5 MG tablet Take 5 mg by mouth daily.      [name] apixaban (ELIQUIS) 2.5 MG TABS tablet Take 1 tablet (2.5 mg total) by mouth 2 (two) times daily.  60 tablet  6  . BENICAR HCT 40-12.5 MG per tablet Take 1 tablet by mouth daily.      . Calcium Carbonate-Vitamin D (CALCIUM + D PO) Take 1 tablet by mouth 2 (two) times daily.      . carvedilol (COREG) 25 MG tablet Take 1 tablet (25 mg total) by mouth 2 (two) times daily.  60 tablet  6  . ferrous sulfate 325 (65  FE) MG tablet Take 325 mg by mouth daily with breakfast.      . furosemide (LASIX) 40 MG tablet Take 1 tablet by mouth daily.      [name] K-TAB 10 MEQ tablet Take 1 tablet by mouth 2 (two) times daily.      [name] letrozole (FEMARA) 2.5 MG tablet Take 2.5 mg by mouth daily.       [name] levothyroxine (SYNTHROID, LEVOTHROID) 25 MCG tablet Take 1 tablet by mouth daily.       No current facility-administered medications on file prior to visit.     Past Medical History  Diagnosis Date  . Arthritis 1990  . Breast cancer 2012    Right stage 1  . Ulcer 1980  . Edema   . Lump or mass in breast     right  . Malignant neoplasm of upper-inner quadrant of female breast     right  . Hemorrhoid   . Unspecified vitamin D deficiency   . Iron deficiency anemia, unspecified   . Hypopotassemia   . Unspecified hypothyroidism   . Goiter, specified as simple   . Unspecified disorder of kidney and ureter   . Edema   . Hyperlipidemia   . Chronic kidney disease     Most recent creatinine was 1.82  . Hypertension 1960     Past Surgical History  Procedure Laterality Date  . Mammosite balloon placement  Right 11/2010  . Colonoscopy  2011  . Breast lumpectomy Right 50 years ago  . Breast lumpectomy Right 2012    invasive   . Cardiac catheterization  06/2010    [facility]: Mild nonobstructive coronary artery disease with normal ejection fraction.     Family History  Problem Relation Age of Onset  . Cancer Mother     ovarian  . Hypertension Mother   . Cancer Brother     colon  . Stroke Father      History   Social History  . Marital Status: Single    Spouse Name: N/A    Number of Children: N/A  . Years of Education: N/A   Occupational History  . Not on file.   Social History Main Topics  . Smoking status: Never Smoker   . Smokeless tobacco: Never Used  . Alcohol Use: No  . Drug Use: No  . Sexual Activity: Not on file   Other Topics Concern  . Not on file   Social History Narrative  . No  narrative on file     ROS A 10 point review of system was performed. It is negative other than that mentioned in the history of present illness.   PHYSICAL EXAM   BP 154/133  Pulse 114  Ht 5\' 7"  (1.702 m)  Wt 224 lb 8 oz (101.833 kg)  BMI 35.15 kg/m2 Constitutional: She is oriented to person, place, and time. She appears well-developed and well-nourished. No distress.  HENT: No nasal discharge.  Head: Normocephalic and atraumatic.  Eyes: Pupils are equal and round. No discharge.  Neck: Normal range of motion. Neck supple. No JVD present. No thyromegaly present.  Cardiovascular: Iregular rhythm, mildly tachycardic normal heart sounds. Exam reveals no gallop and no friction rub. No murmur heard.  Pulmonary/Chest: Effort normal and breath sounds normal. No stridor. No respiratory distress. She has no wheezes. She has no rales. She exhibits no tenderness.  Abdominal: Soft. Bowel sounds are normal. She exhibits no distension. There is no tenderness. There is no rebound and no guarding.  Musculoskeletal: Normal range of motion. She exhibits +2 edema and no tenderness.  Neurological: She is alert and oriented to person, place, and time. Coordination normal.  Skin: Skin is warm and dry. No rash noted. She is not diaphoretic. No erythema. No pallor.  Psychiatric: She has a normal mood and affect. Her behavior is normal. Judgment and thought content normal.     EKG: Atrial fibrillation with rapid ventricular response.  heart rate is 114 beats per minute.  ASSESSMENT AND PLAN

## 2013-09-03 ENCOUNTER — Ambulatory Visit: Payer: Medicare PPO | Admitting: Cardiovascular Disease

## 2013-09-07 ENCOUNTER — Ambulatory Visit (INDEPENDENT_AMBULATORY_CARE_PROVIDER_SITE_OTHER): Payer: Medicare PPO | Admitting: Cardiovascular Disease

## 2013-09-07 ENCOUNTER — Encounter: Payer: Self-pay | Admitting: Cardiovascular Disease

## 2013-09-07 VITALS — BP 140/90 | HR 113 | Ht 67.0 in | Wt 221.0 lb

## 2013-09-07 DIAGNOSIS — I4891 Unspecified atrial fibrillation: Secondary | ICD-10-CM

## 2013-09-07 DIAGNOSIS — R0602 Shortness of breath: Secondary | ICD-10-CM

## 2013-09-07 DIAGNOSIS — I10 Essential (primary) hypertension: Secondary | ICD-10-CM

## 2013-09-07 MED ORDER — METOPROLOL TARTRATE 100 MG PO TABS: 100.0000 mg | ORAL_TABLET | Freq: Two times a day (BID) | ORAL | Status: DC

## 2013-09-07 NOTE — Assessment & Plan Note (Signed)
Blood pressure is reasonably controlled on current medications. 

## 2013-09-07 NOTE — Assessment & Plan Note (Signed)
I elected to keep her on a smaller dose Eliquis 2.5 mg twice daily. She is close to 79 years old and has chronic kidney disease with most recent creatinine of 1.78. She has baseline anemia. Hemoglobin dropped from 10.9 to 9.9 and thus I did not increase the dose.  She is symptomatically better after switching her from carvedilol to metoprolol. I'm increasing the dose to 100 mg twice daily. if ventricular rate remains elevated, I will consider switching her from amlodipine to diltiazem.

## 2013-09-07 NOTE — Patient Instructions (Signed)
Your physician has recommended you make the following change in your medication:  Increase Metoprolol to 100 mg twice daily   Your physician recommends that you schedule a follow-up appointment in:  3 months

## 2013-09-07 NOTE — Progress Notes (Signed)
Primary care physician: Dr. Rosario Jacks  HPI  [name] who is here today for followup visit regarding paroxysmal atrial fibrillation. She had cardiac catheterization done in 2012 which showed mild nonobstructive coronary artery disease. She has chronic medical conditions that include type 2 diabetes, hypertension, hyperlipidemia, chronic kidney disease, anemia of chronic disease, sleep apnea on CPAP, hypothyroidism and history of breast cancer. She was evaluated recently for exertional dyspnea with a nuclear stress test which showed no evidence of ischemia with normal ejection fraction. She was noted to be in atrial fibrillation with a baseline heart rate of 106 beats per minute which quickly increased to 168 beats per minute after only one and a half minutes. Ventricular rate was not controlled on carvedilol. Thus, I switched her to metoprolol during last visit. She reports improved dyspnea and less palpitations.  Allergies  Allergen Reactions  . Celebrex [Celecoxib] Hives  . Other Hives    Arthritis meds     Current Outpatient Prescriptions on File Prior to Visit  Medication Sig Dispense Refill  . amLODipine (NORVASC) 5 MG tablet Take 5 mg by mouth daily.      [name] apixaban (ELIQUIS) 2.5 MG TABS tablet Take 1 tablet (2.5 mg total) by mouth 2 (two) times daily.  60 tablet  6  . BENICAR HCT 40-12.5 MG per tablet Take 1 tablet by mouth daily.      . Calcium Carbonate-Vitamin D (CALCIUM + D PO) Take 1 tablet by mouth 2 (two) times daily.      . ferrous sulfate 325 (65 FE) MG tablet Take 325 mg by mouth daily with breakfast.      . furosemide (LASIX) 40 MG tablet Take 1 tablet by mouth daily.      [name] K-TAB 10 MEQ tablet Take 1 tablet by mouth 2 (two) times daily.      [name] letrozole (FEMARA) 2.5 MG tablet Take 2.5 mg by mouth daily.       [name] levothyroxine (SYNTHROID, LEVOTHROID) 25 MCG tablet Take 1 tablet by mouth daily.      . metoprolol (LOPRESSOR) 50 MG tablet  Take 1 tablet (50 mg total) by mouth 2 (two) times daily.  60 tablet  3   No current facility-administered medications on file prior to visit.     Past Medical History  Diagnosis Date  . Arthritis 1990  . Breast cancer 2012    Right stage 1  . Ulcer 1980  . Edema   . Lump or mass in breast     right  . Malignant neoplasm of upper-inner quadrant of female breast     right  . Hemorrhoid   . Unspecified vitamin D deficiency   . Iron deficiency anemia, unspecified   . Hypopotassemia   . Unspecified hypothyroidism   . Goiter, specified as simple   . Unspecified disorder of kidney and ureter   . Edema   . Hyperlipidemia   . Chronic kidney disease     Most recent creatinine was 1.82  . Hypertension 1960     Past Surgical History  Procedure Laterality Date  . Mammosite balloon placement  Right 11/2010  . Colonoscopy  2011  . Breast lumpectomy Right 50 years ago  . Breast lumpectomy Right 2012    invasive   . Cardiac catheterization  06/2010    [facility]: Mild nonobstructive coronary artery disease with normal ejection fraction.     Family History  Problem Relation Age of Onset  .  Cancer Mother     ovarian  . Hypertension Mother   . Cancer Brother     colon  . Stroke Father      History   Social History  . Marital Status: Single    Spouse Name: N/A    Number of Children: N/A  . Years of Education: N/A   Occupational History  . Not on file.   Social History Main Topics  . Smoking status: Never Smoker   . Smokeless tobacco: Never Used  . Alcohol Use: No  . Drug Use: No  . Sexual Activity: Not on file   Other Topics Concern  . Not on file   Social History Narrative  . No narrative on file     ROS A 10 point review of system was performed. It is negative other than that mentioned in the history of present illness.   PHYSICAL EXAM   BP 140/90  Pulse 113  Ht 5\' 7"  (1.702 m)  Wt 221 lb (100.245 kg)  BMI 34.61 kg/m2 Constitutional: She is oriented  to person, place, and time. She appears well-developed and well-nourished. No distress.  HENT: No nasal discharge.  Head: Normocephalic and atraumatic.  Eyes: Pupils are equal and round. No discharge.  Neck: Normal range of motion. Neck supple. No JVD present. No thyromegaly present.  Cardiovascular: Iregular rhythm, mildly tachycardic normal heart sounds. Exam reveals no gallop and no friction rub. No murmur heard.  Pulmonary/Chest: Effort normal and breath sounds normal. No stridor. No respiratory distress. She has no wheezes. She has no rales. She exhibits no tenderness.  Abdominal: Soft. Bowel sounds are normal. She exhibits no distension. There is no tenderness. There is no rebound and no guarding.  Musculoskeletal: Normal range of motion. She exhibits +2 edema and no tenderness.  Neurological: She is alert and oriented to person, place, and time. Coordination normal.  Skin: Skin is warm and dry. No rash noted. She is not diaphoretic. No erythema. No pallor.  Psychiatric: She has a normal mood and affect. Her behavior is normal. Judgment and thought content normal.     EKG: Atrial fibrillation with ventricular rate of 113 beats per minute -Diffuse ST depression  -Nondiagnostic -possible digitalis effect, -consider subendocardial injury/ischemia.   ABNORMAL   ASSESSMENT AND PLAN

## 2013-10-09 ENCOUNTER — Ambulatory Visit (INDEPENDENT_AMBULATORY_CARE_PROVIDER_SITE_OTHER): Payer: Medicare PPO | Admitting: Cardiovascular Disease

## 2013-10-09 ENCOUNTER — Encounter: Payer: Self-pay | Admitting: Cardiovascular Disease

## 2013-10-09 VITALS — BP 155/100 | HR 132 | Ht 67.0 in | Wt 221.5 lb

## 2013-10-09 DIAGNOSIS — I10 Essential (primary) hypertension: Secondary | ICD-10-CM

## 2013-10-09 DIAGNOSIS — I4891 Unspecified atrial fibrillation: Secondary | ICD-10-CM

## 2013-10-09 MED ORDER — APIXABAN 5 MG PO TABS: 5.0000 mg | ORAL_TABLET | Freq: Two times a day (BID) | ORAL | Status: DC

## 2013-10-09 MED ORDER — DILTIAZEM HCL ER COATED BEADS 120 MG PO CP24: 120.0000 mg | ORAL_CAPSULE | Freq: Every day | ORAL | Status: DC

## 2013-10-09 NOTE — Patient Instructions (Addendum)
Your physician has recommended you make the following change in your medication:  Start Diltiazem 120 mg daily  Increase Eliquis 5 mg twice daily   Your physician recommends that you return for lab work on:  10/27/13  Your physician recommends that you return for EKG on: 10/27/13  Your physician has recommended that you have a Cardioversion (DCCV). Electrical Cardioversion uses a jolt of electricity to your heart either through paddles or wired patches attached to your chest. This is a controlled, usually prescheduled, procedure. Defibrillation is done under light anesthesia in the hospital, and you usually go home the day of the procedure. This is done to get your heart back into a normal rhythm. You are not awake for the procedure. Please see the instruction sheet given to you today.     PLEASE REPORT TO [facility]ICAL [facility] ENTRANCE  THE VOLUNTEERS AT THE FIRST DESK WILL DIRECT YOU WHERE TO GO  Date of Procedure:_______________05/08/15______________________  Arrival Time for Procedure:____________06:30 AM__________________   PLEASE NOTIFY THE OFFICE AT LEAST 68 HOURS IN ADVANCE IF YOU ARE UNABLE TO KEEP YOUR APPOINTMENT.  [phone] AND  PLEASE NOTIFY NUCLEAR MEDICINE AT [facility] AT LEAST 24 HOURS IN ADVANCE IF YOU ARE UNABLE TO KEEP YOUR APPOINTMENT. [phone]  Do not eat or drink after midnight  Take meds with small sip of water

## 2013-10-09 NOTE — Progress Notes (Signed)
Primary care physician: Dr. Rosario Jacks Nephrologist: Dr. Delton See  HPI  [name] who is here today for followup visit regarding persistent atrial fibrillation. She had cardiac catheterization done in 2012 which showed mild nonobstructive coronary artery disease. She has chronic medical conditions that include type 2 diabetes, hypertension, hyperlipidemia, chronic kidney disease, anemia of chronic disease, sleep apnea on CPAP, hypothyroidism and history of breast cancer. She was evaluated a few months ago for exertional dyspnea with a nuclear stress test which showed no evidence of ischemia with normal ejection fraction. She was noted to be in atrial fibrillation with a baseline heart rate of 106 beats per minute which quickly increased to 168 beats per minute after only one and a half minutes. Rate control has been difficult with a beta blocker. She is currently on maximum dose metoprolol 100 mg twice daily. She continues to be tachycardic. TSH was suppressed but since then she has not been taking  Levothyroxine. She uses a CPAP machine regularly. She is having increased dyspnea.  Allergies  Allergen Reactions  . Celebrex [Celecoxib] Hives  . Other Hives    Arthritis meds     Current Outpatient Prescriptions on File Prior to Visit  Medication Sig Dispense Refill  . BENICAR HCT 40-12.5 MG per tablet Take 1 tablet by mouth daily.      . Calcium Carbonate-Vitamin D (CALCIUM + D PO) Take 1 tablet by mouth 2 (two) times daily.      . ferrous sulfate 325 (65 FE) MG tablet Take 325 mg by mouth daily with breakfast.      . furosemide (LASIX) 40 MG tablet Take 1 tablet by mouth daily.      [name] K-TAB 10 MEQ tablet Take 1 tablet by mouth 2 (two) times daily.      [name] letrozole (FEMARA) 2.5 MG tablet Take 2.5 mg by mouth daily.       [name] levothyroxine (SYNTHROID, LEVOTHROID) 25 MCG tablet Take 1 tablet by mouth daily.      . metoprolol (LOPRESSOR) 100 MG tablet Take 1  tablet (100 mg total) by mouth 2 (two) times daily.  90 tablet  3   No current facility-administered medications on file prior to visit.     Past Medical History  Diagnosis Date  . Arthritis 1990  . Breast cancer 2012    Right stage 1  . Ulcer 1980  . Edema   . Lump or mass in breast     right  . Malignant neoplasm of upper-inner quadrant of female breast     right  . Hemorrhoid   . Unspecified vitamin D deficiency   . Iron deficiency anemia, unspecified   . Hypopotassemia   . Unspecified hypothyroidism   . Goiter, specified as simple   . Unspecified disorder of kidney and ureter   . Edema   . Hyperlipidemia   . Chronic kidney disease     Most recent creatinine was 1.82  . Hypertension 1960     Past Surgical History  Procedure Laterality Date  . Mammosite balloon placement  Right 11/2010  . Colonoscopy  2011  . Breast lumpectomy Right 50 years ago  . Breast lumpectomy Right 2012    invasive   . Cardiac catheterization  06/2010    [facility]: Mild nonobstructive coronary artery disease with normal ejection fraction.     Family History  Problem Relation Age of Onset  . Cancer Mother     ovarian  .  Hypertension Mother   . Cancer Brother     colon  . Stroke Father      History   Social History  . Marital Status: Single    Spouse Name: N/A    Number of Children: N/A  . Years of Education: N/A   Occupational History  . Not on file.   Social History Main Topics  . Smoking status: Never Smoker   . Smokeless tobacco: Never Used  . Alcohol Use: No  . Drug Use: No  . Sexual Activity: Not on file   Other Topics Concern  . Not on file   Social History Narrative  . No narrative on file     ROS A 10 point review of system was performed. It is negative other than that mentioned in the history of present illness.   PHYSICAL EXAM   BP 155/100  Pulse 132  Ht 5\' 7"  (1.702 m)  Wt 221 lb 8 oz (100.472 kg)  BMI 34.68 kg/m2 Constitutional: She is oriented  to person, place, and time. She appears well-developed and well-nourished. No distress.  HENT: No nasal discharge.  Head: Normocephalic and atraumatic.  Eyes: Pupils are equal and round. No discharge.  Neck: Normal range of motion. Neck supple. No JVD present. No thyromegaly present.  Cardiovascular: Iregular rhythm, tachycardic normal heart sounds. Exam reveals no gallop and no friction rub. No murmur heard.  Pulmonary/Chest: Effort normal and breath sounds normal. No stridor. No respiratory distress. She has no wheezes. She has no rales. She exhibits no tenderness.  Abdominal: Soft. Bowel sounds are normal. She exhibits no distension. There is no tenderness. There is no rebound and no guarding.  Musculoskeletal: Normal range of motion. She exhibits +2 edema and no tenderness.  Neurological: She is alert and oriented to person, place, and time. Coordination normal.  Skin: Skin is warm and dry. No rash noted. She is not diaphoretic. No erythema. No pallor.  Psychiatric: She has a normal mood and affect. Her behavior is normal. Judgment and thought content normal.     EKG: Atrial fibrillation with ventricular rate of 132 beats per minute -Nonspecific ST changes.  ABNORMAL   ASSESSMENT AND PLAN

## 2013-10-09 NOTE — Assessment & Plan Note (Signed)
Blood pressure is elevated. She is no longer taking amlodipine. Diltiazem was added as outlined above.

## 2013-10-09 NOTE — Assessment & Plan Note (Signed)
She continues to have atrial fibrillation with rapid ventricular response in spite of maximum dose metoprolol 100 mg twice daily. I added diltiazem extended release 120 mg once daily. Due to difficulty with rate control and the fact that she is becoming symptomatic, I recommend proceeding with cardioversion. I increased the dose of Apixaban to 5 mg twice daily and we'll plan cardioversion in 3 weeks.

## 2013-10-27 ENCOUNTER — Ambulatory Visit (INDEPENDENT_AMBULATORY_CARE_PROVIDER_SITE_OTHER): Payer: Medicare PPO | Admitting: *Deleted

## 2013-10-27 ENCOUNTER — Other Ambulatory Visit: Payer: Medicare PPO

## 2013-10-27 VITALS — BP 140/90 | HR 86 | Ht 67.0 in | Wt 226.5 lb

## 2013-10-27 DIAGNOSIS — I4891 Unspecified atrial fibrillation: Secondary | ICD-10-CM

## 2013-10-27 NOTE — Progress Notes (Signed)
Pt here for pre cardioversion EKG and blood work

## 2013-10-27 NOTE — Patient Instructions (Signed)
Please follow instructions for your cardioversion

## 2013-10-28 ENCOUNTER — Telehealth: Payer: Self-pay | Admitting: *Deleted

## 2013-10-28 LAB — CBC WITH DIFFERENTIAL
BASOS ABS: 0 10*3/uL (ref 0.0–0.2)
Basos: 0 %
EOS ABS: 0.1 10*3/uL (ref 0.0–0.4)
Eos: 1 %
HEMATOCRIT: 33.2 % — AB (ref 34.0–46.6)
Hemoglobin: 10.7 g/dL — ABNORMAL LOW (ref 11.1–15.9)
Immature Grans (Abs): 0 10*3/uL (ref 0.0–0.1)
Immature Granulocytes: 0 %
LYMPHS ABS: 1 10*3/uL (ref 0.7–3.1)
Lymphs: 16 %
MCH: 27.9 pg (ref 26.6–33.0)
MCHC: 32.2 g/dL (ref 31.5–35.7)
MCV: 87 fL (ref 79–97)
Monocytes Absolute: 0.5 10*3/uL (ref 0.1–0.9)
Monocytes: 8 %
NEUTROS ABS: 4.5 10*3/uL (ref 1.4–7.0)
Neutrophils Relative %: 75 %
Platelets: 272 10*3/uL (ref 150–379)
RBC: 3.83 x10E6/uL (ref 3.77–5.28)
RDW: 15.4 % (ref 12.3–15.4)
WBC: 6 10*3/uL (ref 3.4–10.8)

## 2013-10-28 LAB — BASIC METABOLIC PANEL
BUN / CREAT RATIO: 18 (ref 11–26)
BUN: 34 mg/dL — AB (ref 8–27)
CHLORIDE: 103 mmol/L (ref 97–108)
CO2: 24 mmol/L (ref 18–29)
Calcium: 9.6 mg/dL (ref 8.7–10.3)
Creatinine, Ser: 1.84 mg/dL — ABNORMAL HIGH (ref 0.57–1.00)
GFR, EST AFRICAN AMERICAN: 30 mL/min/{1.73_m2} — AB (ref >59)
GFR, EST NON AFRICAN AMERICAN: 26 mL/min/{1.73_m2} — AB (ref >59)
Glucose: 111 mg/dL — ABNORMAL HIGH (ref 65–99)
POTASSIUM: 4 mmol/L (ref 3.5–5.2)
Sodium: 143 mmol/L (ref 134–144)

## 2013-10-28 LAB — PROTIME-INR
INR: 1.1 (ref 0.8–1.2)
PROTHROMBIN TIME: 11.4 s (ref 9.1–12.0)

## 2013-10-28 NOTE — Telephone Encounter (Signed)
Cardioversion orders and labs sent [facility].

## 2013-10-30 ENCOUNTER — Ambulatory Visit: Payer: Self-pay | Admitting: Cardiovascular Disease

## 2013-10-30 DIAGNOSIS — I4891 Unspecified atrial fibrillation: Secondary | ICD-10-CM

## 2013-11-10 ENCOUNTER — Encounter: Payer: Self-pay | Admitting: Cardiovascular Disease

## 2013-11-10 ENCOUNTER — Ambulatory Visit (INDEPENDENT_AMBULATORY_CARE_PROVIDER_SITE_OTHER): Payer: Medicare PPO | Admitting: Cardiovascular Disease

## 2013-11-10 VITALS — BP 160/80 | HR 82 | Ht 67.0 in | Wt 225.5 lb

## 2013-11-10 DIAGNOSIS — I4891 Unspecified atrial fibrillation: Secondary | ICD-10-CM

## 2013-11-10 DIAGNOSIS — I10 Essential (primary) hypertension: Secondary | ICD-10-CM

## 2013-11-10 NOTE — Patient Instructions (Signed)
Continue same medications.   Your physician wants you to follow-up in: 6 months.  You will receive a reminder letter in the mail two months in advance. If you don't receive a letter, please call our office to schedule the follow-up appointment.  

## 2013-11-10 NOTE — Assessment & Plan Note (Signed)
Blood pressure is elevated today. She is on multiple blood pressure medications and no changes were made.

## 2013-11-10 NOTE — Assessment & Plan Note (Signed)
She had recent successful cardioversion. However, she is back in atrial fibrillation today. Nonetheless, ventricular rate is actually controlled on current medications and she does not seem to be symptomatic from atrial fibrillation. Thus, I recommend continuing rate control and long-term anticoagulation. It is possible that recent difficulty in controlling her A. fib was related to taking Synthroid. Continue to monitor thyroid function.

## 2013-11-10 NOTE — Progress Notes (Signed)
Primary care physician: Dr. Rosario Jacks Nephrologist: Dr. Delton See  HPI  [name] who is here today for followup visit regarding persistent atrial fibrillation. She had cardiac catheterization done in 2012 which showed mild nonobstructive coronary artery disease. She has chronic medical conditions that include type 2 diabetes, hypertension, hyperlipidemia, chronic kidney disease, anemia of chronic disease, sleep apnea on CPAP, hypothyroidism and history of breast cancer. She was evaluated a few months ago for exertional dyspnea with a nuclear stress test which showed no evidence of ischemia with normal ejection fraction. She was noted to be in atrial fibrillation with a baseline heart rate of 106 beats per minute which quickly increased to 168 beats per minute after only one and a half minutes. Rate control was very difficult to achieve with a beta blocker and calcium channel blocker. Thus, I proceeded with cardioversion on 5/8. She converted to normal sinus rhythm. However, she is noted today to be in atrial fibrillation again.  Allergies  Allergen Reactions  . Celebrex [Celecoxib] Hives  . Other Hives    Arthritis meds     Current Outpatient Prescriptions on File Prior to Visit  Medication Sig Dispense Refill  . apixaban (ELIQUIS) 5 MG TABS tablet Take 1 tablet (5 mg total) by mouth 2 (two) times daily.  60 tablet  6  . BENICAR HCT 40-12.5 MG per tablet Take 1 tablet by mouth daily.      . Calcium Carbonate-Vitamin D (CALCIUM + D PO) Take 1 tablet by mouth 2 (two) times daily.      [name] diltiazem (CARDIZEM CD) 120 MG 24 hr capsule Take 1 capsule (120 mg total) by mouth daily.  90 capsule  3  . ferrous sulfate 325 (65 FE) MG tablet Take 325 mg by mouth daily with breakfast.      . furosemide (LASIX) 40 MG tablet Take 1 tablet by mouth daily.      [name] K-TAB 10 MEQ tablet Take 1 tablet by mouth 2 (two) times daily.      [name] letrozole (FEMARA) 2.5 MG tablet Take 2.5  mg by mouth daily.       . metoprolol (LOPRESSOR) 100 MG tablet Take 1 tablet (100 mg total) by mouth 2 (two) times daily.  90 tablet  3   No current facility-administered medications on file prior to visit.     Past Medical History  Diagnosis Date  . Arthritis 1990  . Breast cancer 2012    Right stage 1  . Ulcer 1980  . Edema   . Lump or mass in breast     right  . Malignant neoplasm of upper-inner quadrant of female breast     right  . Hemorrhoid   . Unspecified vitamin D deficiency   . Iron deficiency anemia, unspecified   . Hypopotassemia   . Unspecified hypothyroidism   . Goiter, specified as simple   . Unspecified disorder of kidney and ureter   . Edema   . Hyperlipidemia   . Chronic kidney disease     Most recent creatinine was 1.82  . Hypertension 1960     Past Surgical History  Procedure Laterality Date  . Mammosite balloon placement  Right 11/2010  . Colonoscopy  2011  . Breast lumpectomy Right 50 years ago  . Breast lumpectomy Right 2012    invasive   . Cardiac catheterization  06/2010    [facility]: Mild nonobstructive coronary artery disease with normal ejection fraction.  . Cardioversion  Family History  Problem Relation Age of Onset  . Cancer Mother     ovarian  . Hypertension Mother   . Cancer Brother     colon  . Stroke Father      History   Social History  . Marital Status: Single    Spouse Name: N/A    Number of Children: N/A  . Years of Education: N/A   Occupational History  . Not on file.   Social History Main Topics  . Smoking status: Never Smoker   . Smokeless tobacco: Never Used  . Alcohol Use: No  . Drug Use: No  . Sexual Activity: Not on file   Other Topics Concern  . Not on file   Social History Narrative  . No narrative on file     ROS A 10 point review of system was performed. It is negative other than that mentioned in the history of present illness.   PHYSICAL EXAM   BP 160/80  Pulse 82  Ht 5\' 7"   (1.702 m)  Wt 225 lb 8 oz (102.286 kg)  BMI 35.31 kg/m2 Constitutional: She is oriented to person, place, and time. She appears well-developed and well-nourished. No distress.  HENT: No nasal discharge.  Head: Normocephalic and atraumatic.  Eyes: Pupils are equal and round. No discharge.  Neck: Normal range of motion. Neck supple. No JVD present. No thyromegaly present.  Cardiovascular: Iregular rhythm, tachycardic normal heart sounds. Exam reveals no gallop and no friction rub. No murmur heard.  Pulmonary/Chest: Effort normal and breath sounds normal. No stridor. No respiratory distress. She has no wheezes. She has no rales. She exhibits no tenderness.  Abdominal: Soft. Bowel sounds are normal. She exhibits no distension. There is no tenderness. There is no rebound and no guarding.  Musculoskeletal: Normal range of motion. She exhibits +2 edema and no tenderness.  Neurological: She is alert and oriented to person, place, and time. Coordination normal.  Skin: Skin is warm and dry. No rash noted. She is not diaphoretic. No erythema. No pallor.  Psychiatric: She has a normal mood and affect. Her behavior is normal. Judgment and thought content normal.     EKG: Atrial fibrillation  -Nonspecific ST depression  -Nondiagnostic.   ABNORMAL   ASSESSMENT AND PLAN

## 2013-12-08 ENCOUNTER — Ambulatory Visit: Payer: Medicare PPO | Admitting: Cardiovascular Disease

## 2013-12-09 ENCOUNTER — Encounter: Payer: Self-pay | Admitting: *Deleted

## 2014-01-14 ENCOUNTER — Encounter: Payer: Self-pay | Admitting: General Surgery

## 2014-01-27 ENCOUNTER — Encounter: Payer: Self-pay | Admitting: General Surgery

## 2014-02-04 ENCOUNTER — Encounter: Payer: Self-pay | Admitting: General Surgery

## 2014-02-04 ENCOUNTER — Ambulatory Visit (INDEPENDENT_AMBULATORY_CARE_PROVIDER_SITE_OTHER): Payer: Medicare PPO | Admitting: Cardiovascular Disease

## 2014-02-04 ENCOUNTER — Encounter: Payer: Self-pay | Admitting: Cardiovascular Disease

## 2014-02-04 ENCOUNTER — Ambulatory Visit (INDEPENDENT_AMBULATORY_CARE_PROVIDER_SITE_OTHER): Payer: Medicare HMO | Admitting: General Surgery

## 2014-02-04 VITALS — BP 140/88 | HR 84 | Resp 16 | Ht 67.0 in | Wt 233.0 lb

## 2014-02-04 VITALS — BP 172/94 | HR 100 | Ht 67.0 in | Wt 234.0 lb

## 2014-02-04 DIAGNOSIS — I890 Lymphedema, not elsewhere classified: Secondary | ICD-10-CM

## 2014-02-04 DIAGNOSIS — I151 Hypertension secondary to other renal disorders: Secondary | ICD-10-CM

## 2014-02-04 DIAGNOSIS — C50919 Malignant neoplasm of unspecified site of unspecified female breast: Secondary | ICD-10-CM

## 2014-02-04 DIAGNOSIS — I4891 Unspecified atrial fibrillation: Secondary | ICD-10-CM

## 2014-02-04 DIAGNOSIS — N2889 Other specified disorders of kidney and ureter: Secondary | ICD-10-CM

## 2014-02-04 DIAGNOSIS — I150 Renovascular hypertension: Secondary | ICD-10-CM

## 2014-02-04 DIAGNOSIS — I5032 Chronic diastolic (congestive) heart failure: Secondary | ICD-10-CM

## 2014-02-04 DIAGNOSIS — C50911 Malignant neoplasm of unspecified site of right female breast: Secondary | ICD-10-CM

## 2014-02-04 MED ORDER — FUROSEMIDE 40 MG PO TABS: 40.0000 mg | ORAL_TABLET | Freq: Two times a day (BID) | ORAL | Status: DC

## 2014-02-04 MED ORDER — METOPROLOL TARTRATE 100 MG PO TABS: 150.0000 mg | ORAL_TABLET | Freq: Two times a day (BID) | ORAL | Status: DC

## 2014-02-04 NOTE — Patient Instructions (Signed)
Your physician has recommended you make the following change in your medication:  Stop Diltiazem  Increase Metoprolol to 150 mg twice a day Increase Lasix to 40 mg twice a day   Your physician recommends that you schedule a follow-up appointment in:  1 month

## 2014-02-04 NOTE — Assessment & Plan Note (Signed)
This seems to be chronic at the present time. I discontinued diltiazem due to severe lower extremity edema. I increased the dose of metoprolol to 150 mg twice daily.

## 2014-02-04 NOTE — Progress Notes (Signed)
Patient ID: Teresa Lutz, female   DOB: 1934-06-28, 79 y.o.   MRN: [id number]  Chief Complaint  Patient presents with  . Follow-up    mammogram     HPI Teresa Lutz is a 79 y.o. female.  who presents for her follow up breast cancer evaluation. The most recent mammogram was done on 01-26-14.  Patient does perform regular self breast checks and gets regular mammograms done.   No new breast complaints.  She has been to her Cardiologist today and he changed your medications to try and get some of the edema off her feet.  She has noticed her right arm swelling some over the past 2 months.  HPI  Past Medical History  Diagnosis Date  . Arthritis 1990  . Breast cancer 2012    Right stage 1  . Ulcer 1980  . Edema   . Lump or mass in breast     right  . Malignant neoplasm of upper-inner quadrant of female breast     right  . Hemorrhoid   . Unspecified vitamin D deficiency   . Iron deficiency anemia, unspecified   . Hypopotassemia   . Unspecified hypothyroidism   . Goiter, specified as simple   . Unspecified disorder of kidney and ureter   . Edema   . Hyperlipidemia   . Chronic kidney disease     Most recent creatinine was 1.82  . Hypertension 1960  . Atrial fibrillation 2014    Dr Fletcher Anon    Past Surgical History  Procedure Laterality Date  . Mammosite balloon placement  Right 11/2010  . Colonoscopy  2011  . Breast lumpectomy Right 50 years ago  . Breast lumpectomy Right 2012    invasive   . Cardiac catheterization  06/2010    [facility]: Mild nonobstructive coronary artery disease with normal ejection fraction.  . Cardioversion      Family History  Problem Relation Age of Onset  . Cancer Mother     ovarian  . Hypertension Mother   . Cancer Brother     colon  . Stroke Father     Social History History  Substance Use Topics  . Smoking status: Never Smoker   . Smokeless tobacco: Never Used  . Alcohol Use: No    Allergies  Allergen Reactions  . Celebrex  [Celecoxib] Hives  . Other Hives    Arthritis meds    Current Outpatient Prescriptions  Medication Sig Dispense Refill  . apixaban (ELIQUIS) 5 MG TABS tablet Take 1 tablet (5 mg total) by mouth 2 (two) times daily.  60 tablet  6  . BENICAR HCT 40-12.5 MG per tablet Take 1 tablet by mouth daily.      . Calcium Carbonate-Vitamin D (CALCIUM + D PO) Take 1 tablet by mouth 2 (two) times daily.      . ferrous sulfate 325 (65 FE) MG tablet Take 325 mg by mouth daily with breakfast.      . furosemide (LASIX) 40 MG tablet Take 1 tablet (40 mg total) by mouth 2 (two) times daily.  60 tablet  6  . K-TAB 10 MEQ tablet Take 1 tablet by mouth 2 (two) times daily.      [name] letrozole (FEMARA) 2.5 MG tablet Take 2.5 mg by mouth daily.       . metoprolol (LOPRESSOR) 100 MG tablet Take 1.5 tablets (150 mg total) by mouth 2 (two) times daily.  45 tablet  6   No current facility-administered medications for this visit.  Review of Systems Review of Systems  Constitutional: Negative.   Respiratory: Negative.   Cardiovascular: Negative.     Blood pressure 140/88, pulse 84, resp. rate 16, height 5\' 7"  (1.702 m), weight 233 lb (105.688 kg).  Physical Exam Physical Exam  Constitutional: She is oriented to person, place, and time. She appears well-developed and well-nourished.  Eyes: Conjunctivae are normal. No scleral icterus.  Neck: Neck supple.  Cardiovascular: Normal rate, regular rhythm and normal heart sounds.   Moderate to severe lower leg edema.  Pulmonary/Chest: Effort normal and breath sounds normal. Right breast exhibits no inverted nipple, no mass, no nipple discharge, no skin change and no tenderness. Left breast exhibits no inverted nipple, no mass, no nipple discharge, no skin change and no tenderness.  Lymphadenopathy:    She has no cervical adenopathy.    She has no axillary adenopathy.  Mild swelling distal right forearm and hand suggest early lymphedema.  Neurological: She is alert and  oriented to person, place, and time.  Skin: Skin is warm and dry.    Data Reviewed Mammogram reviewed and stable.  Assessment    Stable physical exam with mild swelling distal right forearm and hand suggest early lymphedema.  Right breast CA, postlumpectomy and radiation. Currently on Letrazole. Pt 3 yrs post treatment for right breast invasive mammary CA T1,N0,M0. Er/PR pos, Her 2 neg.  Plan    Suggest lymphedema sleeve. Follow up in one year with bilateral diagnostic mammogram and office visit.       Nestor Wieneke G 02/05/2014, 8:11 AM

## 2014-02-04 NOTE — Patient Instructions (Addendum)
Continue self breast exams. Call office for any new breast issues or concerns. Follow up in one year with bilateral diagnostic mammogram and office visit  

## 2014-02-04 NOTE — Assessment & Plan Note (Signed)
The patient appears to be fluid overloaded with severe lower extremity edema which I suspect is likely due to a combination of diastolic heart failure, chronic kidney disease and the effect of treatment with a calcium channel blocker. I increased the dose of Lasix to 40 mg twice daily. She might require higher doses than this given chronic kidney disease. I also discontinued treatment with diltiazem.

## 2014-02-04 NOTE — Assessment & Plan Note (Signed)
Blood pressure is elevated. She might benefit from higher doses of loop diuretic.

## 2014-02-04 NOTE — Progress Notes (Signed)
Primary care physician: Dr. Rosario Jacks Nephrologist: Dr. Delton See  HPI  [name] who is here today for followup visit regarding persistent atrial fibrillation. She had cardiac catheterization done in 2012 which showed mild nonobstructive coronary artery disease. She has chronic medical conditions that include type 2 diabetes, hypertension, hyperlipidemia, chronic kidney disease, anemia of chronic disease, sleep apnea on CPAP, hypothyroidism and history of breast cancer. She was evaluated a few months ago for exertional dyspnea with a nuclear stress test which showed no evidence of ischemia with normal ejection fraction. She was noted to be in atrial fibrillation with a baseline heart rate of 106 beats per minute which quickly increased to 168 beats per minute after only one and a half minutes. Rate control was very difficult to achieve with a beta blocker and calcium channel blocker. Thus, I proceeded with cardioversion on 5/8. She converted to normal sinus rhythm. However, she is noted today to be in atrial fibrillation again. I continued rate control. She was noted to have worsening of lower extremity edema and weight gain. She is complaining of more shortness of breath.  Allergies  Allergen Reactions  . Celebrex [Celecoxib] Hives  . Other Hives    Arthritis meds     Current Outpatient Prescriptions on File Prior to Visit  Medication Sig Dispense Refill  . apixaban (ELIQUIS) 5 MG TABS tablet Take 1 tablet (5 mg total) by mouth 2 (two) times daily.  60 tablet  6  . BENICAR HCT 40-12.5 MG per tablet Take 1 tablet by mouth daily.      . Calcium Carbonate-Vitamin D (CALCIUM + D PO) Take 1 tablet by mouth 2 (two) times daily.      [name] diltiazem (CARDIZEM CD) 120 MG 24 hr capsule Take 1 capsule (120 mg total) by mouth daily.  90 capsule  3  . ferrous sulfate 325 (65 FE) MG tablet Take 325 mg by mouth daily with breakfast.      . furosemide (LASIX) 40 MG tablet  Take 1 tablet by mouth daily.      [name] K-TAB 10 MEQ tablet Take 1 tablet by mouth 2 (two) times daily.      [name] letrozole (FEMARA) 2.5 MG tablet Take 2.5 mg by mouth daily.       . metoprolol (LOPRESSOR) 100 MG tablet Take 1 tablet (100 mg total) by mouth 2 (two) times daily.  90 tablet  3   No current facility-administered medications on file prior to visit.     Past Medical History  Diagnosis Date  . Arthritis 1990  . Breast cancer 2012    Right stage 1  . Ulcer 1980  . Edema   . Lump or mass in breast     right  . Malignant neoplasm of upper-inner quadrant of female breast     right  . Hemorrhoid   . Unspecified vitamin D deficiency   . Iron deficiency anemia, unspecified   . Hypopotassemia   . Unspecified hypothyroidism   . Goiter, specified as simple   . Unspecified disorder of kidney and ureter   . Edema   . Hyperlipidemia   . Chronic kidney disease     Most recent creatinine was 1.82  . Hypertension 1960     Past Surgical History  Procedure Laterality Date  . Mammosite balloon placement  Right 11/2010  . Colonoscopy  2011  . Breast lumpectomy Right 50 years ago  . Breast lumpectomy Right 2012  invasive   . Cardiac catheterization  06/2010    [facility]: Mild nonobstructive coronary artery disease with normal ejection fraction.  . Cardioversion       Family History  Problem Relation Age of Onset  . Cancer Mother     ovarian  . Hypertension Mother   . Cancer Brother     colon  . Stroke Father      History   Social History  . Marital Status: Single    Spouse Name: N/A    Number of Children: N/A  . Years of Education: N/A   Occupational History  . Not on file.   Social History Main Topics  . Smoking status: Never Smoker   . Smokeless tobacco: Never Used  . Alcohol Use: No  . Drug Use: No  . Sexual Activity: Not on file   Other Topics Concern  . Not on file   Social History Narrative  . No narrative on file     ROS A 10 point review of  system was performed. It is negative other than that mentioned in the history of present illness.   PHYSICAL EXAM   BP 172/94  Pulse 100  Ht 5\' 7"  (1.702 m)  Wt 234 lb (106.142 kg)  BMI 36.64 kg/m2 Constitutional: She is oriented to person, place, and time. She appears well-developed and well-nourished. No distress.  HENT: No nasal discharge.  Head: Normocephalic and atraumatic.  Eyes: Pupils are equal and round. No discharge.  Neck: Normal range of motion. Neck supple. Mild JVD present. No thyromegaly present.  Cardiovascular: Iregular rhythm, tachycardic normal heart sounds. Exam reveals no gallop and no friction rub. No murmur heard.  Pulmonary/Chest: Effort normal and breath sounds normal. No stridor. No respiratory distress. She has no wheezes. She has no rales. She exhibits no tenderness.  Abdominal: Soft. Bowel sounds are normal. She exhibits no distension. There is no tenderness. There is no rebound and no guarding.  Musculoskeletal: Normal range of motion. She exhibits severe edema and no tenderness.  Neurological: She is alert and oriented to person, place, and time. Coordination normal.  Skin: Skin is warm and dry. No rash noted. She is not diaphoretic. No erythema. No pallor.  Psychiatric: She has a normal mood and affect. Her behavior is normal. Judgment and thought content normal.     EKG: Atrial fibrillation  -Nonspecific ST depression  -Nondiagnostic.   ABNORMAL     ASSESSMENT AND PLAN

## 2014-02-05 ENCOUNTER — Encounter: Payer: Self-pay | Admitting: General Surgery

## 2014-02-08 ENCOUNTER — Encounter: Payer: Self-pay | Admitting: General Surgery

## 2014-02-08 DIAGNOSIS — C50911 Malignant neoplasm of unspecified site of right female breast: Secondary | ICD-10-CM | POA: Insufficient documentation

## 2014-03-09 ENCOUNTER — Ambulatory Visit: Payer: Medicare PPO | Admitting: Cardiovascular Disease

## 2014-03-18 ENCOUNTER — Ambulatory Visit (INDEPENDENT_AMBULATORY_CARE_PROVIDER_SITE_OTHER): Payer: Medicare PPO | Admitting: Cardiovascular Disease

## 2014-03-18 ENCOUNTER — Encounter: Payer: Self-pay | Admitting: Cardiovascular Disease

## 2014-03-18 ENCOUNTER — Emergency Department: Payer: Self-pay | Admitting: Emergency Medicine

## 2014-03-18 VITALS — BP 154/76 | HR 157 | Ht 67.0 in | Wt 212.2 lb

## 2014-03-18 DIAGNOSIS — I10 Essential (primary) hypertension: Secondary | ICD-10-CM

## 2014-03-18 DIAGNOSIS — I5032 Chronic diastolic (congestive) heart failure: Secondary | ICD-10-CM

## 2014-03-18 DIAGNOSIS — I4891 Unspecified atrial fibrillation: Secondary | ICD-10-CM

## 2014-03-18 LAB — COMPREHENSIVE METABOLIC PANEL
ALK PHOS: 69 U/L
Albumin: 2.6 g/dL — ABNORMAL LOW (ref 3.4–5.0)
Anion Gap: 9 (ref 7–16)
BUN: 74 mg/dL — ABNORMAL HIGH (ref 7–18)
Bilirubin,Total: 0.3 mg/dL (ref 0.2–1.0)
CALCIUM: 9.8 mg/dL (ref 8.5–10.1)
CHLORIDE: 100 mmol/L (ref 98–107)
CO2: 30 mmol/L (ref 21–32)
Creatinine: 3.67 mg/dL — ABNORMAL HIGH (ref 0.60–1.30)
EGFR (Non-African Amer.): 13 — ABNORMAL LOW
GFR CALC AF AMER: 15 — AB
Glucose: 138 mg/dL — ABNORMAL HIGH (ref 65–99)
OSMOLALITY: 302 (ref 275–301)
POTASSIUM: 2.8 mmol/L — AB (ref 3.5–5.1)
SGOT(AST): 17 U/L (ref 15–37)
SGPT (ALT): 13 U/L — ABNORMAL LOW
Sodium: 139 mmol/L (ref 136–145)
Total Protein: 8.3 g/dL — ABNORMAL HIGH (ref 6.4–8.2)

## 2014-03-18 LAB — CBC
HCT: 31.6 % — ABNORMAL LOW (ref 35.0–47.0)
HGB: 10.4 g/dL — ABNORMAL LOW (ref 12.0–16.0)
MCH: 29.7 pg (ref 26.0–34.0)
MCHC: 32.8 g/dL (ref 32.0–36.0)
MCV: 90 fL (ref 80–100)
Platelet: 285 10*3/uL (ref 150–440)
RBC: 3.5 10*6/uL — ABNORMAL LOW (ref 3.80–5.20)
RDW: 15.8 % — ABNORMAL HIGH (ref 11.5–14.5)
WBC: 9 10*3/uL (ref 3.6–11.0)

## 2014-03-18 LAB — TROPONIN I

## 2014-03-18 NOTE — Assessment & Plan Note (Signed)
Fluid overload seems to have improved after changing the dose of Lasix to 40 mg twice daily. However , we have to make sure that she is not volume depleted.

## 2014-03-18 NOTE — Progress Notes (Signed)
Primary care physician: Dr. Rosario Jacks Nephrologist: Dr. Delton See  HPI  [name] who is here today for followup visit regarding persistent atrial fibrillation. She had cardiac catheterization done in 2012 which showed mild nonobstructive coronary artery disease. She has chronic medical conditions that include type 2 diabetes, hypertension, hyperlipidemia, chronic kidney disease, anemia of chronic disease, sleep apnea on CPAP, hypothyroidism and history of breast cancer. She was evaluated a few months ago for exertional dyspnea with a nuclear stress test which showed no evidence of ischemia with normal ejection fraction. She was noted to be in atrial fibrillation with a baseline heart rate of 106 beats per minute which quickly increased to 168 beats per minute after only one and a half minutes. Rate control was very difficult to achieve with a beta blocker and calcium channel blocker. Thus, I proceeded with cardioversion on 5/8. She converted to normal sinus rhythm. However, she is noted today to be in atrial fibrillation again. I continued rate control. During last visit, she was noted to be significantly fluid overloaded with severe lower extremity edema. I increased the dose of Lasix, discontinue diltiazem and increase metoprolol to 150 mg twice daily. Unfortunately, she has of metoprolol completely about one week ago and has not been taking the medication. She could not afford the prescription although it is generic. Over the last few days, she has been feeling extremely weak, with dyspnea and no energy. She is noted to be tachycardic today with a heart rate of 157 beats per minute.  Allergies  Allergen Reactions  . Celebrex [Celecoxib] Hives  . Other Hives    Arthritis meds     Current Outpatient Prescriptions on File Prior to Visit  Medication Sig Dispense Refill  . apixaban (ELIQUIS) 5 MG TABS tablet Take 1 tablet (5 mg total) by mouth 2 (two) times daily.   60 tablet  6  . BENICAR HCT 40-12.5 MG per tablet Take 1 tablet by mouth daily.      . Calcium Carbonate-Vitamin D (CALCIUM + D PO) Take 1 tablet by mouth 2 (two) times daily.      . ferrous sulfate 325 (65 FE) MG tablet Take 325 mg by mouth daily with breakfast.      . furosemide (LASIX) 40 MG tablet Take 1 tablet (40 mg total) by mouth 2 (two) times daily.  60 tablet  6  . K-TAB 10 MEQ tablet Take 1 tablet by mouth 2 (two) times daily.      [name] letrozole (FEMARA) 2.5 MG tablet Take 2.5 mg by mouth daily.       . metoprolol (LOPRESSOR) 100 MG tablet Take 1.5 tablets (150 mg total) by mouth 2 (two) times daily.  45 tablet  6   No current facility-administered medications on file prior to visit.     Past Medical History  Diagnosis Date  . Arthritis 1990  . Breast cancer 2012    Right stage 1  . Ulcer 1980  . Edema   . Lump or mass in breast     right  . Malignant neoplasm of upper-inner quadrant of female breast     right  . Hemorrhoid   . Unspecified vitamin D deficiency   . Iron deficiency anemia, unspecified   . Hypopotassemia   . Unspecified hypothyroidism   . Goiter, specified as simple   . Unspecified disorder of kidney and ureter   . Edema   . Hyperlipidemia   . Chronic kidney disease  Most recent creatinine was 1.82  . Hypertension 1960  . Atrial fibrillation 2014    Dr Fletcher Anon     Past Surgical History  Procedure Laterality Date  . Mammosite balloon placement  Right 11/2010  . Colonoscopy  2011  . Breast lumpectomy Right 50 years ago  . Breast lumpectomy Right 2012    invasive   . Cardiac catheterization  06/2010    [facility]: Mild nonobstructive coronary artery disease with normal ejection fraction.  . Cardioversion       Family History  Problem Relation Age of Onset  . Cancer Mother     ovarian  . Hypertension Mother   . Cancer Brother     colon  . Stroke Father      History   Social History  . Marital Status: Single    Spouse Name: N/A     Number of Children: N/A  . Years of Education: N/A   Occupational History  . Not on file.   Social History Main Topics  . Smoking status: Never Smoker   . Smokeless tobacco: Never Used  . Alcohol Use: No  . Drug Use: No  . Sexual Activity: Not on file   Other Topics Concern  . Not on file   Social History Narrative  . No narrative on file     ROS A 10 point review of system was performed. It is negative other than that mentioned in the history of present illness.   PHYSICAL EXAM   BP 154/76  Pulse 157  Ht 5\' 7"  (1.702 m)  Wt 212 lb 4 oz (96.276 kg)  BMI 33.24 kg/m2 Constitutional: She is oriented to person, place, and time. She appears well-developed and well-nourished. No distress.  HENT: No nasal discharge.  Head: Normocephalic and atraumatic.  Eyes: Pupils are equal and round. No discharge.  Neck: Normal range of motion. Neck supple. Mild JVD present. No thyromegaly present.  Cardiovascular: Iregular rhythm, tachycardic normal heart sounds. Exam reveals no gallop and no friction rub. No murmur heard.  Pulmonary/Chest: Effort normal and breath sounds normal. No stridor. No respiratory distress. She has no wheezes. She has no rales. She exhibits no tenderness.  Abdominal: Soft. Bowel sounds are normal. She exhibits no distension. There is no tenderness. There is no rebound and no guarding.  Musculoskeletal: Normal range of motion. She exhibits mild edema and no tenderness.  Neurological: She is alert and oriented to person, place, and time. Coordination normal.  Skin: Skin is warm and dry. No rash noted. She is not diaphoretic. No erythema. No pallor.  Psychiatric: She has a normal mood and affect. Her behavior is normal. Judgment and thought content normal.     EKG: Atrial fibrillation  -Nonspecific ST depression  -Nondiagnostic.   ABNORMAL      ASSESSMENT AND PLAN

## 2014-03-18 NOTE — Assessment & Plan Note (Signed)
Blood pressure is elevated today likely due to running out of metoprolol.

## 2014-03-18 NOTE — Assessment & Plan Note (Signed)
The patient is in atrial fibrillation with rapid ventricular response likely due to not taking metoprolol as prescribed. She reports inability to afford the prescription. She is symptomatic with dizziness, shortness of breath and fatigue. Given significant tachycardia, I'm advising the patient to the emergency room at [facility] for evaluation. She will require rate control. If rate becomes controlled in the emergency room and had labs are unremarkable, she can be discharged home as long as she is able to get metoprolol as prescribed. She is supposed to be on metoprolol tartrate 150 mg twice daily.

## 2014-03-18 NOTE — Patient Instructions (Addendum)
Go the ER at [facility].   [employer] inspector) informed via telephone

## 2014-04-26 ENCOUNTER — Encounter: Payer: Self-pay | Admitting: Cardiovascular Disease

## 2014-05-28 ENCOUNTER — Other Ambulatory Visit: Payer: Self-pay

## 2014-05-28 MED ORDER — APIXABAN 5 MG PO TABS: 5.0000 mg | ORAL_TABLET | Freq: Two times a day (BID) | ORAL | Status: DC

## 2014-05-28 NOTE — Telephone Encounter (Signed)
Refill sent for eliquis 5 mg bid

## 2014-05-31 ENCOUNTER — Other Ambulatory Visit: Payer: Self-pay

## 2014-05-31 MED ORDER — APIXABAN 5 MG PO TABS: 5.0000 mg | ORAL_TABLET | Freq: Two times a day (BID) | ORAL | Status: DC

## 2014-05-31 NOTE — Telephone Encounter (Signed)
Refill sent for eliquis 5 mg take one tablet twice a day.

## 2014-06-09 ENCOUNTER — Ambulatory Visit: Payer: Self-pay | Admitting: Radiation Oncology

## 2014-06-25 ENCOUNTER — Ambulatory Visit: Payer: Self-pay | Admitting: Radiation Oncology

## 2014-08-02 ENCOUNTER — Other Ambulatory Visit: Payer: Self-pay | Admitting: Radiology

## 2014-08-02 DIAGNOSIS — R0602 Shortness of breath: Secondary | ICD-10-CM

## 2014-08-03 ENCOUNTER — Other Ambulatory Visit: Payer: Self-pay

## 2014-08-03 MED ORDER — METOPROLOL TARTRATE 100 MG PO TABS: 150.0000 mg | ORAL_TABLET | Freq: Two times a day (BID) | ORAL | Status: DC

## 2014-08-03 NOTE — Telephone Encounter (Signed)
Refill sent for metoprolol tart  

## 2014-08-17 ENCOUNTER — Other Ambulatory Visit: Payer: Medicare PPO

## 2014-08-19 ENCOUNTER — Other Ambulatory Visit (INDEPENDENT_AMBULATORY_CARE_PROVIDER_SITE_OTHER): Payer: Medicare PPO

## 2014-08-19 ENCOUNTER — Other Ambulatory Visit: Payer: Self-pay

## 2014-08-19 DIAGNOSIS — R0602 Shortness of breath: Secondary | ICD-10-CM

## 2014-08-24 ENCOUNTER — Ambulatory Visit: Payer: Medicare PPO | Admitting: Cardiovascular Disease

## 2014-09-06 ENCOUNTER — Ambulatory Visit (INDEPENDENT_AMBULATORY_CARE_PROVIDER_SITE_OTHER): Payer: Medicare PPO | Admitting: Cardiovascular Disease

## 2014-09-06 ENCOUNTER — Encounter: Payer: Self-pay | Admitting: Cardiovascular Disease

## 2014-09-06 VITALS — BP 138/98 | HR 113 | Ht 67.0 in | Wt 218.5 lb

## 2014-09-06 DIAGNOSIS — I5032 Chronic diastolic (congestive) heart failure: Secondary | ICD-10-CM

## 2014-09-06 DIAGNOSIS — I4891 Unspecified atrial fibrillation: Secondary | ICD-10-CM

## 2014-09-06 DIAGNOSIS — I10 Essential (primary) hypertension: Secondary | ICD-10-CM

## 2014-09-06 MED ORDER — DILTIAZEM HCL ER COATED BEADS 120 MG PO CP24: 120.0000 mg | ORAL_CAPSULE | Freq: Every day | ORAL | Status: DC

## 2014-09-06 NOTE — Assessment & Plan Note (Signed)
I recommend continuing rate control and anticoagulation. Ventricular rate is still not optimally controlled in spite of maximal dose metoprolol. Digoxin is not a good option due to chronic kidney disease. Thus, I decided to start diltiazem extended release 120 mg once daily. She does have chronic leg edema and we have to watch this.

## 2014-09-06 NOTE — Assessment & Plan Note (Signed)
Blood pressure is mildly elevated. Diltiazem was added today.

## 2014-09-06 NOTE — Assessment & Plan Note (Signed)
She appears to be euvolemic on current medications. 

## 2014-09-06 NOTE — Patient Instructions (Signed)
Your physician has recommended you make the following change in your medication:  Start Diltiazem 120 mg once daily    Your physician recommends that you schedule a follow-up appointment in:  3 months

## 2014-09-06 NOTE — Progress Notes (Signed)
Primary care physician: Dr. Rosario Jacks Nephrologist: Dr. Delton See  HPI  This is a 80 year old African American female who is here today for followup visit regarding persistent atrial fibrillation. She had cardiac catheterization done in 2012 which showed mild nonobstructive coronary artery disease. She has chronic medical conditions that include type 2 diabetes, hypertension, hyperlipidemia, chronic kidney disease, anemia of chronic disease, sleep apnea on CPAP, hypothyroidism, chronic leg edema and history of breast cancer. Atrial fibrillation has been treated with rate control and anticoagulation. Most recent echocardiogram in February 2016 showed an ejection fraction of 50-55%, mild mitral regurgitation, mildly dilated right and left atrium, moderate tricuspid regurgitation and mild pulmonary hypertension. During last visit, she was very tachycardic due to running out of metoprolol. She resumed metoprolol 150 twice a day with improvement in heart rate. She denies chest pain. She continues to have chronic exertional dyspnea.   Allergies  Allergen Reactions  . Celebrex [Celecoxib] Hives  . Other Hives    Arthritis meds     Current Outpatient Prescriptions on File Prior to Visit  Medication Sig Dispense Refill  . apixaban (ELIQUIS) 5 MG TABS tablet Take 1 tablet (5 mg total) by mouth 2 (two) times daily. 60 tablet 6  . BENICAR HCT 40-12.5 MG per tablet Take 1 tablet by mouth daily.    . Calcium Carbonate-Vitamin D (CALCIUM + D PO) Take 1 tablet by mouth 2 (two) times daily.    . ferrous sulfate 325 (65 FE) MG tablet Take 325 mg by mouth daily with breakfast.    . furosemide (LASIX) 40 MG tablet Take 1 tablet (40 mg total) by mouth 2 (two) times daily. (Patient taking differently: Take 40 mg by mouth daily. ) 60 tablet 6  . K-TAB 10 MEQ tablet Take 1 tablet by mouth 2 (two) times daily.    [name] letrozole (FEMARA) 2.5 MG tablet Take 2.5 mg by mouth daily.     . metoprolol (LOPRESSOR) 100 MG tablet  Take 1.5 tablets (150 mg total) by mouth 2 (two) times daily. 45 tablet 6   No current facility-administered medications on file prior to visit.     Past Medical History  Diagnosis Date  . Arthritis 1990  . Breast cancer 2012    Right stage 1  . Ulcer 1980  . Edema   . Lump or mass in breast     right  . Malignant neoplasm of upper-inner quadrant of female breast     right  . Hemorrhoid   . Unspecified vitamin D deficiency   . Iron deficiency anemia, unspecified   . Hypopotassemia   . Unspecified hypothyroidism   . Goiter, specified as simple   . Unspecified disorder of kidney and ureter   . Edema   . Hyperlipidemia   . Chronic kidney disease     Most recent creatinine was 1.82  . Hypertension 1960  . Atrial fibrillation 2014    Dr Fletcher Anon     Past Surgical History  Procedure Laterality Date  . Mammosite balloon placement  Right 11/2010  . Colonoscopy  2011  . Breast lumpectomy Right 50 years ago  . Breast lumpectomy Right 2012    invasive   . Cardiac catheterization  06/2010    [facility]: Mild nonobstructive coronary artery disease with normal ejection fraction.  . Cardioversion       Family History  Problem Relation Age of Onset  . Cancer Mother     ovarian  . Hypertension Mother   . Cancer Brother  colon  . Stroke Father      History   Social History  . Marital Status: Single    Spouse Name: N/A  . Number of Children: N/A  . Years of Education: N/A   Occupational History  . Not on file.   Social History Main Topics  . Smoking status: Never Smoker   . Smokeless tobacco: Never Used  . Alcohol Use: No  . Drug Use: No  . Sexual Activity: Not on file   Other Topics Concern  . Not on file   Social History Narrative     ROS A 10 point review of system was performed. It is negative other than that mentioned in the history of present illness.   PHYSICAL EXAM   BP 138/98 mmHg  Pulse 113  Ht 5\' 7"  (1.702 m)  Wt 218 lb 8 oz (99.111 kg)   BMI 34.21 kg/m2 Constitutional: She is oriented to person, place, and time. She appears well-developed and well-nourished. No distress.  HENT: No nasal discharge.  Head: Normocephalic and atraumatic.  Eyes: Pupils are equal and round. No discharge.  Neck: Normal range of motion. Neck supple. Mild JVD present. No thyromegaly present.  Cardiovascular: Iregular rhythm, tachycardic normal heart sounds. Exam reveals no gallop and no friction rub. No murmur heard.  Pulmonary/Chest: Effort normal and breath sounds normal. No stridor. No respiratory distress. She has no wheezes. She has no rales. She exhibits no tenderness.  Abdominal: Soft. Bowel sounds are normal. She exhibits no distension. There is no tenderness. There is no rebound and no guarding.  Musculoskeletal: Normal range of motion. She exhibits mild edema and no tenderness.  Neurological: She is alert and oriented to person, place, and time. Coordination normal.  Skin: Skin is warm and dry. No rash noted. She is not diaphoretic. No erythema. No pallor.  Psychiatric: She has a normal mood and affect. Her behavior is normal. Judgment and thought content normal.     EKG: Atrial fibrillation  -Nonspecific ST changes  ABNORMAL       ASSESSMENT AND PLAN

## 2014-10-04 ENCOUNTER — Other Ambulatory Visit: Payer: Self-pay | Admitting: *Deleted

## 2014-10-04 MED ORDER — METOPROLOL TARTRATE 100 MG PO TABS: 150.0000 mg | ORAL_TABLET | Freq: Two times a day (BID) | ORAL | Status: DC

## 2014-12-07 ENCOUNTER — Ambulatory Visit: Payer: Medicare PPO | Admitting: Cardiovascular Disease

## 2014-12-07 ENCOUNTER — Encounter: Payer: Self-pay | Admitting: *Deleted

## 2014-12-22 ENCOUNTER — Other Ambulatory Visit: Payer: Self-pay

## 2014-12-22 DIAGNOSIS — C50911 Malignant neoplasm of unspecified site of right female breast: Secondary | ICD-10-CM

## 2015-02-07 ENCOUNTER — Ambulatory Visit (INDEPENDENT_AMBULATORY_CARE_PROVIDER_SITE_OTHER): Payer: Medicare HMO | Admitting: General Surgery

## 2015-02-07 ENCOUNTER — Encounter: Payer: Self-pay | Admitting: General Surgery

## 2015-02-07 ENCOUNTER — Ambulatory Visit: Payer: Medicare HMO | Admitting: General Surgery

## 2015-02-07 VITALS — BP 140/90 | HR 92 | Resp 15 | Ht 67.0 in | Wt 211.0 lb

## 2015-02-07 DIAGNOSIS — C50911 Malignant neoplasm of unspecified site of right female breast: Secondary | ICD-10-CM

## 2015-02-07 NOTE — Progress Notes (Signed)
Patient ID: Teresa Lutz, female   DOB: 1934/07/24, 80 y.o.   MRN: [id number]  Chief Complaint  Patient presents with  . Follow-up    mammogram    HPI Teresa Lutz is a 80 y.o. female with a diagnosis of right breast cancer in 2012 who presents for a mammogram follow up. The most recent mammogram was done on 02/01/15. Patient does perform regular self breast checks and gets regular mammograms done. She states that she feels a small bump under the right breast. No other problems to report.    HPI  Past Medical History  Diagnosis Date  . Arthritis 1990  . Breast cancer 2012    Right stage 1  . Ulcer 1980  . Edema   . Lump or mass in breast     right  . Malignant neoplasm of upper-inner quadrant of female breast     right  . Hemorrhoid   . Unspecified vitamin D deficiency   . Iron deficiency anemia, unspecified   . Hypopotassemia   . Unspecified hypothyroidism   . Goiter, specified as simple   . Unspecified disorder of kidney and ureter   . Edema   . Hyperlipidemia   . Chronic kidney disease     Most recent creatinine was 1.82  . Hypertension 1960  . Atrial fibrillation 2014    Dr Fletcher Anon    Past Surgical History  Procedure Laterality Date  . Mammosite balloon placement  Right 11/2010  . Colonoscopy  2011  . Breast lumpectomy Right 50 years ago  . Breast lumpectomy Right 2012    invasive   . Cardiac catheterization  06/2010    [facility]: Mild nonobstructive coronary artery disease with normal ejection fraction.  . Cardioversion      Family History  Problem Relation Age of Onset  . Cancer Mother     ovarian  . Hypertension Mother   . Cancer Brother     colon  . Stroke Father     Social History Social History  Substance Use Topics  . Smoking status: Never Smoker   . Smokeless tobacco: Never Used  . Alcohol Use: No    Allergies  Allergen Reactions  . Celebrex [Celecoxib] Hives  . Other Hives    Arthritis meds    Current Outpatient Prescriptions   Medication Sig Dispense Refill  . apixaban (ELIQUIS) 5 MG TABS tablet Take 1 tablet (5 mg total) by mouth 2 (two) times daily. 60 tablet 6  . BENICAR HCT 40-12.5 MG per tablet Take 1 tablet by mouth daily.    . Calcium Carbonate-Vitamin D (CALCIUM + D PO) Take 1 tablet by mouth 2 (two) times daily.    [name] diltiazem (CARDIZEM CD) 120 MG 24 hr capsule Take 1 capsule (120 mg total) by mouth daily. 30 capsule 6  . ferrous sulfate 325 (65 FE) MG tablet Take 325 mg by mouth daily with breakfast.    . furosemide (LASIX) 40 MG tablet Take 1 tablet (40 mg total) by mouth 2 (two) times daily. (Patient taking differently: Take 40 mg by mouth daily. ) 60 tablet 6  . K-TAB 10 MEQ tablet Take 1 tablet by mouth 2 (two) times daily.    [name] letrozole (FEMARA) 2.5 MG tablet Take 2.5 mg by mouth daily.     [name] levothyroxine (SYNTHROID, LEVOTHROID) 25 MCG tablet Take 25 mcg by mouth daily.     . metoprolol (LOPRESSOR) 100 MG tablet Take 1.5 tablets (150 mg total) by mouth 2 (two) times  daily. 90 tablet 3   No current facility-administered medications for this visit.    Review of Systems Review of Systems  Constitutional: Negative.   Respiratory: Negative.   Cardiovascular: Negative.     Blood pressure 140/90, pulse 92, resp. rate 15, height 5\' 7"  (1.702 m), weight 211 lb (95.709 kg).  Physical Exam Physical Exam  Constitutional: She is oriented to person, place, and time. She appears well-developed and well-nourished.  Eyes: Conjunctivae are normal. Left eye exhibits no discharge.  Neck: Neck supple.  Cardiovascular: Normal rate and normal heart sounds.   Pulmonary/Chest: Effort normal and breath sounds normal. Right breast exhibits no inverted nipple, no mass, no nipple discharge, no skin change and no tenderness. Left breast exhibits no inverted nipple, no mass, no nipple discharge, no skin change and no tenderness.  Abdominal: Soft. Normal appearance.  Lymphadenopathy:    She has no cervical adenopathy.     She has no axillary adenopathy.  Neurological: She is alert and oriented to person, place, and time.  Skin: Skin is warm and dry.  Psychiatric: She has a normal mood and affect.    Data Reviewed Mammogram- stable  Assessment    CA right breast, 79yrs post lumpectomy, SN biopsy. T1,N0. Hormone pos. Pt is on Femara and doing well.     Plan    1 yr followup with bil diagnostic mammogram     PCP: Dr Vidal Schwalbe 02/07/2015, 4:20 PM

## 2015-02-14 ENCOUNTER — Other Ambulatory Visit: Payer: Self-pay | Admitting: *Deleted

## 2015-02-14 MED ORDER — FUROSEMIDE 40 MG PO TABS: 40.0000 mg | ORAL_TABLET | Freq: Two times a day (BID) | ORAL | Status: DC

## 2015-02-17 ENCOUNTER — Ambulatory Visit (INDEPENDENT_AMBULATORY_CARE_PROVIDER_SITE_OTHER): Payer: Medicare HMO | Admitting: Cardiovascular Disease

## 2015-02-17 ENCOUNTER — Encounter: Payer: Self-pay | Admitting: Cardiovascular Disease

## 2015-02-17 VITALS — BP 146/95 | HR 101 | Ht 67.0 in | Wt 218.0 lb

## 2015-02-17 DIAGNOSIS — G4730 Sleep apnea, unspecified: Secondary | ICD-10-CM | POA: Diagnosis not present

## 2015-02-17 DIAGNOSIS — G4733 Obstructive sleep apnea (adult) (pediatric): Secondary | ICD-10-CM

## 2015-02-17 DIAGNOSIS — I5032 Chronic diastolic (congestive) heart failure: Secondary | ICD-10-CM

## 2015-02-17 DIAGNOSIS — I10 Essential (primary) hypertension: Secondary | ICD-10-CM | POA: Diagnosis not present

## 2015-02-17 DIAGNOSIS — I4891 Unspecified atrial fibrillation: Secondary | ICD-10-CM

## 2015-02-17 NOTE — Assessment & Plan Note (Signed)
Blood pressure is reasonably controlled. 

## 2015-02-17 NOTE — Assessment & Plan Note (Signed)
Ventricular rate is more controlled than before. Continue treatment with metoprolol and diltiazem. Continue anticoagulation.

## 2015-02-17 NOTE — Assessment & Plan Note (Signed)
She seems to be mildly fluid overloaded but she did not take furosemide today.

## 2015-02-17 NOTE — Assessment & Plan Note (Signed)
She reports that her CPAP machine has been broken and she has not been using it. This is likely contributing to  daytime somnolence, fatigue and possibly dropping the rate for atrial fibrillation. I'm referring her to a sleep medicine specialist for management.

## 2015-02-17 NOTE — Patient Instructions (Signed)
Medication Instructions:  Your physician recommends that you continue on your current medications as directed. Please refer to the Current Medication list given to you today.   Labwork: none  Testing/Procedures: none  Follow-Up: Your physician recommends that you schedule a follow-up appointment in: six months with Dr. Fletcher Anon.  You have been referred to Pulmonary for sleep apnea   Any Other Special Instructions Will Be Listed Below (If Applicable).

## 2015-02-17 NOTE — Progress Notes (Signed)
Primary care physician: Dr. Rosario Jacks Nephrologist: Dr. Delton See  HPI  This is a 80 year old African American female who is here today for followup visit regarding chronic atrial fibrillation. She had cardiac catheterization done in 2012 which showed mild nonobstructive coronary artery disease. She has chronic medical conditions that include type 2 diabetes, hypertension, hyperlipidemia, chronic kidney disease, anemia of chronic disease, sleep apnea on CPAP, hypothyroidism, chronic leg edema and history of breast cancer. Atrial fibrillation has been treated with rate control and anticoagulation. Most recent echocardiogram in February 2016 showed an ejection fraction of 50-55%, mild mitral regurgitation, mildly dilated right and left atrium, moderate tricuspid regurgitation and mild pulmonary hypertension.  Rate control improved after the addition of diltiazem. Her biggest issue today is day time somnolence. She sleeps throughout the day and has no energy. She is known to have sleep apnea. However, she reports that her machine has been broken and she has not been using it. She said even when she uses it, it did not fit right on her face.  Allergies  Allergen Reactions  . Celebrex [Celecoxib] Hives  . Other Hives    Arthritis meds     Current Outpatient Prescriptions on File Prior to Visit  Medication Sig Dispense Refill  . apixaban (ELIQUIS) 5 MG TABS tablet Take 1 tablet (5 mg total) by mouth 2 (two) times daily. 60 tablet 6  . BENICAR HCT 40-12.5 MG per tablet Take 1 tablet by mouth daily.    [name] diltiazem (CARDIZEM CD) 120 MG 24 hr capsule Take 1 capsule (120 mg total) by mouth daily. 30 capsule 6  . ferrous sulfate 325 (65 FE) MG tablet Take 325 mg by mouth daily with breakfast.    . furosemide (LASIX) 40 MG tablet Take 1 tablet (40 mg total) by mouth 2 (two) times daily. (Patient taking differently: Take 40 mg by mouth daily. ) 60 tablet 3  . K-TAB 10 MEQ tablet Take 1 tablet by mouth daily.      [name] letrozole (FEMARA) 2.5 MG tablet Take 2.5 mg by mouth daily.     [name] levothyroxine (SYNTHROID, LEVOTHROID) 25 MCG tablet Take 25 mcg by mouth daily.     . metoprolol (LOPRESSOR) 100 MG tablet Take 1.5 tablets (150 mg total) by mouth 2 (two) times daily. 90 tablet 3   No current facility-administered medications on file prior to visit.     Past Medical History  Diagnosis Date  . Arthritis 1990  . Breast cancer 2012    Right stage 1  . Ulcer 1980  . Edema   . Lump or mass in breast     right  . Malignant neoplasm of upper-inner quadrant of female breast     right  . Hemorrhoid   . Unspecified vitamin D deficiency   . Iron deficiency anemia, unspecified   . Hypopotassemia   . Unspecified hypothyroidism   . Goiter, specified as simple   . Unspecified disorder of kidney and ureter   . Edema   . Hyperlipidemia   . Chronic kidney disease     Most recent creatinine was 1.82  . Hypertension 1960  . Atrial fibrillation 2014    Dr Fletcher Anon     Past Surgical History  Procedure Laterality Date  . Mammosite balloon placement  Right 11/2010  . Colonoscopy  2011  . Breast lumpectomy Right 50 years ago  . Breast lumpectomy Right 2012    invasive   . Cardiac catheterization  06/2010    [facility]:  Mild nonobstructive coronary artery disease with normal ejection fraction.  . Cardioversion       Family History  Problem Relation Age of Onset  . Cancer Mother     ovarian  . Hypertension Mother   . Cancer Brother     colon  . Stroke Father      Social History   Social History  . Marital Status: Single    Spouse Name: N/A  . Number of Children: N/A  . Years of Education: N/A   Occupational History  . Not on file.   Social History Main Topics  . Smoking status: Never Smoker   . Smokeless tobacco: Never Used  . Alcohol Use: No  . Drug Use: No  . Sexual Activity: Not on file   Other Topics Concern  . Not on file   Social History Narrative     ROS A 10 point  review of system was performed. It is negative other than that mentioned in the history of present illness.   PHYSICAL EXAM   BP 146/95 mmHg  Pulse 101  Ht 5\' 7"  (1.702 m)  Wt 218 lb (98.884 kg)  BMI 34.14 kg/m2 Constitutional: She is oriented to person, place, and time. She appears well-developed and well-nourished. No distress.  HENT: No nasal discharge.  Head: Normocephalic and atraumatic.  Eyes: Pupils are equal and round. No discharge.  Neck: Normal range of motion. Neck supple. Mild JVD present. No thyromegaly present.  Cardiovascular: Iregular rhythm, tachycardic normal heart sounds. Exam reveals no gallop and no friction rub. No murmur heard.  Pulmonary/Chest: Effort normal and breath sounds normal. No stridor. No respiratory distress. She has no wheezes. She has no rales. She exhibits no tenderness.  Abdominal: Soft. Bowel sounds are normal. She exhibits no distension. There is no tenderness. There is no rebound and no guarding.  Musculoskeletal: Normal range of motion. She exhibits mild edema and no tenderness.  Neurological: She is alert and oriented to person, place, and time. Coordination normal.  Skin: Skin is warm and dry. No rash noted. She is not diaphoretic. No erythema. No pallor.  Psychiatric: She has a normal mood and affect. Her behavior is normal. Judgment and thought content normal.     EKG: Atrial fibrillation  -Nonspecific ST changes  ABNORMAL       ASSESSMENT AND PLAN

## 2015-02-22 ENCOUNTER — Ambulatory Visit: Payer: Medicare HMO | Admitting: Internal Medicine

## 2015-03-03 ENCOUNTER — Encounter: Payer: Self-pay | Admitting: Internal Medicine

## 2015-03-03 ENCOUNTER — Ambulatory Visit (INDEPENDENT_AMBULATORY_CARE_PROVIDER_SITE_OTHER): Payer: Medicare HMO | Admitting: Internal Medicine

## 2015-03-03 VITALS — BP 144/90 | HR 85 | Wt 214.0 lb

## 2015-03-03 DIAGNOSIS — R0600 Dyspnea, unspecified: Secondary | ICD-10-CM

## 2015-03-03 DIAGNOSIS — I5032 Chronic diastolic (congestive) heart failure: Secondary | ICD-10-CM | POA: Diagnosis not present

## 2015-03-03 DIAGNOSIS — I10 Essential (primary) hypertension: Secondary | ICD-10-CM | POA: Diagnosis not present

## 2015-03-03 DIAGNOSIS — I4891 Unspecified atrial fibrillation: Secondary | ICD-10-CM | POA: Diagnosis not present

## 2015-03-03 DIAGNOSIS — G4733 Obstructive sleep apnea (adult) (pediatric): Secondary | ICD-10-CM

## 2015-03-03 DIAGNOSIS — R2681 Unsteadiness on feet: Secondary | ICD-10-CM

## 2015-03-03 NOTE — Patient Instructions (Signed)
--   Will need to order new PAP supplies through her current oxygen supplier.  --Will need to find out her sleep study results and what her prescribed settings were on her PAP.  --Will order home PT due to unstable gait.  --Follow up in 3 months.  --2 view CXR.

## 2015-03-03 NOTE — Progress Notes (Signed)
[facility] Consultation      Assessment and Plan:  Dyspnea. -This is likely multifactorial from severe deconditioning, debility, volume overload, muscle weakness. -Appearance she does not appear to have any evidence of primary pulmonary disease, she was ambulated for approximately 50 feet here in the office without any evidence of desaturation. -I will check a two-view chest x-ray, I doubt the patient will be able to coordinate for a full lung function test. Therefore, we will defer that test. For now.  Obstructive sleep apnea. -Per history, the patient has a history of obstructive sleep apnea, she is currently on a VPAP device uncertain of the settings. The patient is not sure when she had test done where was done. We will see if we can find out where the test was done and what the patient's prescribed settings are -Patient currently uses 1 L of oxygen with her PAP machine, I have asked her to let us know who oxygen supplier is and we would be able to prescribe her new full face mask to be used with her machine. -I've instructed the patient to use her Pap machine whenever she is sleeping in bed, which she is currently not doing.  Debility and deconditioning. -The patient has severe debility and deconditioning, she is able unable to stand up from a seated position. -I will place an order for home physical therapy.  Chronic venous stasis and volume overload. -This is likely contributing to her dyspnea. This is likely due to chronic diastolic CHF as well as chronic kidney disease.  Atrial fibrillation. -Currently on Eliquis  Chronic kidney disease.   Date: 03/03/2015  MRN# [id number] Teresa Lutz May 19, 1935  Referring Physician:   Brayah Urquilla is a 80 y.o. old female seen in consultation for chief complaint of:   No chief complaint on file.   HPI:   This is a 80 year old African American female who has been seeing Dr. Fletcher Anon, she has a history of atrial  fibrillation. cardiac catheterization done in 2012 which showed mild nonobstructive coronary artery disease, type 2 diabetes, hypertension, hyperlipidemia, chronic kidney disease, anemia of chronic disease, sleep apnea on CPAP, hypothyroidism, chronic leg edema and history of breast cancer. Atrial fibrillation has been treated with rate control and anticoagulation. She was diagnosed with OSA a few years ago, she has been using VPAP, which she has brought in with her today with a full face mask. She has not changed the mask in over 2 years. She uses it every night, she finds that the mask leaks a lot. She puts the mask on at 2 am when she goes to bed, and wakes at 6 am and takes it off.  Her daughter notes that she sleeps much of the day. She goes from the bed to the chair and sleeps there. She then goes back to the bed at some point, but does not put the mask back on .  She oxygen at 1L with PAP at night but not during the day when she sleeps. She does not use the oxygen otherwise.   She gets dyspnea when walking from one room to the other. She drives a car, she walks into a supermarket and will be winded. Then she uses a motorized cart. She is on no inhalers, she has occasional cough. She has never smoked, but has a lot of second hand smoke exposure.  She worked in an [employer] in the past.  The patient's daughter is present and told me that the patient  is unable to stand up on her own from a seated position. Therefore, they have provided high chairs and a high beds. The patient able is able to stand up on her own. She has a history of falls and was not able to get up until the family has arrived to help her get up.  Most recent echocardiogram in 08/19/2014 showed an ejection fraction of 50-55%, mild mitral regurgitation, mildly dilated right and left atrium, moderate tricuspid regurgitation and pulmonary artery systolic pressure was 49 mmHg.   PMHX:   Past Medical History  Diagnosis Date  .  Arthritis 1990  . Breast cancer 2012    Right stage 1  . Ulcer 1980  . Edema   . Lump or mass in breast     right  . Malignant neoplasm of upper-inner quadrant of female breast     right  . Hemorrhoid   . Unspecified vitamin D deficiency   . Iron deficiency anemia, unspecified   . Hypopotassemia   . Unspecified hypothyroidism   . Goiter, specified as simple   . Unspecified disorder of kidney and ureter   . Edema   . Hyperlipidemia   . Chronic kidney disease     Most recent creatinine was 1.82  . Hypertension 1960  . Atrial fibrillation 2014    Dr Fletcher Anon   Surgical Hx:  Past Surgical History  Procedure Laterality Date  . Mammosite balloon placement  Right 11/2010  . Colonoscopy  2011  . Breast lumpectomy Right 50 years ago  . Breast lumpectomy Right 2012    invasive   . Cardiac catheterization  06/2010    [facility]: Mild nonobstructive coronary artery disease with normal ejection fraction.  . Cardioversion     Family Hx:  Family History  Problem Relation Age of Onset  . Cancer Mother     ovarian  . Hypertension Mother   . Cancer Brother     colon  . Stroke Father    Social Hx:   Social History  Substance Use Topics  . Smoking status: Never Smoker   . Smokeless tobacco: Never Used  . Alcohol Use: No   Medication:   Current Outpatient Rx  Name  Route  Sig  Dispense  Refill  . apixaban (ELIQUIS) 5 MG TABS tablet   Oral   Take 1 tablet (5 mg total) by mouth 2 (two) times daily.   60 tablet   6   . BENICAR HCT 40-12.5 MG per tablet   Oral   Take 1 tablet by mouth daily.         [name] diltiazem (CARDIZEM CD) 120 MG 24 hr capsule   Oral   Take 1 capsule (120 mg total) by mouth daily.   30 capsule   6   . ferrous sulfate 325 (65 FE) MG tablet   Oral   Take 325 mg by mouth daily with breakfast.         . furosemide (LASIX) 40 MG tablet   Oral   Take 1 tablet (40 mg total) by mouth 2 (two) times daily. Patient taking differently: Take 40 mg by mouth daily.     60 tablet   3   . K-TAB 10 MEQ tablet   Oral   Take 1 tablet by mouth daily.          [name] letrozole (FEMARA) 2.5 MG tablet   Oral   Take 2.5 mg by mouth daily.          [name]  levothyroxine (SYNTHROID, LEVOTHROID) 25 MCG tablet   Oral   Take 25 mcg by mouth daily.          . metoprolol (LOPRESSOR) 100 MG tablet   Oral   Take 1.5 tablets (150 mg total) by mouth 2 (two) times daily.   90 tablet   3       Allergies:  Celebrex and Other  Review of Systems: Gen:  Denies  fever, sweats, chills HEENT: Denies blurred vision, double vision. bleeds, sore throat Cvc:  No dizziness, chest pain. Resp:   Denies cough or sputum porduction, shortness of breath Gi: Denies swallowing difficulty, stomach pain. Gu:  Denies bladder incontinence, burning urine Ext:   No Joint pain, stiffness. Skin: No skin rash,  hives Endoc:  No polyuria, polydipsia. Psych: No depression, insomnia. Other:  All other systems were reviewed with the patient and were negative other that what is mentioned in the HPI.   Physical Examination:   VS: BP 144/90 mmHg  Pulse 85  Wt 97.07 kg (214 lb)  SpO2 100%  General Appearance: No distress  Neuro:without focal findings,  speech normal,  HEENT: PERRLA, EOM intact.   Pulmonary: normal breath sounds, No wheezing.  CardiovascularNormal S1,S2.  No m/r/g.   Abdomen: Benign, Soft, non-tender. Renal:  No costovertebral tenderness  GU:  No performed at this time. Endoc: No evident thyromegaly, no signs of acromegaly. Skin:   warm, no rashes, no ecchymosis  Extremities: The patient has 4+ bilateral lower extremity pitting edema. no cyanosis, clubbing.  Other findings:    LABORATORY PANEL:   CBC No results for input(s): WBC, HGB, HCT, PLT in the last 168 hours. ------------------------------------------------------------------------------------------------------------------  Chemistries  No results for input(s): NA, K, CL, CO2, GLUCOSE, BUN, CREATININE,  CALCIUM, MG, AST, ALT, ALKPHOS, BILITOT in the last 168 hours.  Invalid input(s): GFRCGP ------------------------------------------------------------------------------------------------------------------  Cardiac Enzymes No results for input(s): TROPONINI in the last 168 hours. ------------------------------------------------------------   Thank  you for the consultation and for allowing [facility] to assist in the care of your patient. Our recommendations are noted above.  Please contact us if we can be of further service.   Marda Stalker, MD.  Board Certified in Internal Medicine, Pulmonary Medicine, [place], and Sleep Medicine.  [facility] Office Number: [phone]  Patricia Pesa, M.D.  Vilinda Boehringer, M.D.  Cheral Marker, M.D

## 2015-03-09 ENCOUNTER — Other Ambulatory Visit: Payer: Self-pay

## 2015-03-09 MED ORDER — METOPROLOL TARTRATE 100 MG PO TABS: 150.0000 mg | ORAL_TABLET | Freq: Two times a day (BID) | ORAL | Status: DC

## 2015-03-09 NOTE — Telephone Encounter (Signed)
Refill sent for Metoprolol tart

## 2015-03-17 ENCOUNTER — Encounter: Payer: Self-pay | Admitting: Cardiovascular Disease

## 2015-03-17 ENCOUNTER — Ambulatory Visit (INDEPENDENT_AMBULATORY_CARE_PROVIDER_SITE_OTHER): Payer: Medicare HMO | Admitting: Cardiovascular Disease

## 2015-03-17 VITALS — BP 173/108 | HR 101 | Ht 67.0 in | Wt 218.0 lb

## 2015-03-17 DIAGNOSIS — I5032 Chronic diastolic (congestive) heart failure: Secondary | ICD-10-CM | POA: Diagnosis not present

## 2015-03-17 DIAGNOSIS — I10 Essential (primary) hypertension: Secondary | ICD-10-CM | POA: Diagnosis not present

## 2015-03-17 DIAGNOSIS — I4891 Unspecified atrial fibrillation: Secondary | ICD-10-CM | POA: Diagnosis not present

## 2015-03-17 DIAGNOSIS — G4733 Obstructive sleep apnea (adult) (pediatric): Secondary | ICD-10-CM | POA: Diagnosis not present

## 2015-03-17 MED ORDER — TORSEMIDE 20 MG PO TABS: 40.0000 mg | ORAL_TABLET | Freq: Two times a day (BID) | ORAL | Status: DC

## 2015-03-17 NOTE — Progress Notes (Signed)
Primary care physician: Dr. Rosario Jacks Nephrologist: Dr. Juanito Doom  HPI  This is a 80 year old African American female who is here today for followup visit regarding chronic atrial fibrillation and chronic diastolic heart failure. She had cardiac catheterization done in 2012 which showed mild nonobstructive coronary artery disease. She has chronic medical conditions that include type 2 diabetes, hypertension, hyperlipidemia, chronic kidney disease, anemia of chronic disease, sleep apnea on CPAP, hypothyroidism, chronic leg edema and history of breast cancer. Atrial fibrillation has been treated with rate control and anticoagulation. Most recent echocardiogram in February 2016 showed an ejection fraction of 50-55%, mild mitral regurgitation, mildly dilated right and left atrium, moderate tricuspid regurgitation and mild pulmonary hypertension.  She fell recently in her yard and was not able to get up due to weakness. She did not have syncope. She reports worsening exertional dyspnea and leg edema. She has been taking furosemide once daily instead of twice daily. There has also been a question about compliance with medications. She does not follow a low-sodium diet. She does have sleep apnea and reports receiving new supplies for CPAP machine which she started using.  Allergies  Allergen Reactions  . Celebrex [Celecoxib] Hives  . Other Hives    Arthritis meds     Current Outpatient Prescriptions on File Prior to Visit  Medication Sig Dispense Refill  . apixaban (ELIQUIS) 5 MG TABS tablet Take 1 tablet (5 mg total) by mouth 2 (two) times daily. 60 tablet 6  . BENICAR HCT 40-12.5 MG per tablet Take 1 tablet by mouth daily.    [name] diltiazem (CARDIZEM CD) 120 MG 24 hr capsule Take 1 capsule (120 mg total) by mouth daily. 30 capsule 6  . ferrous sulfate 325 (65 FE) MG tablet Take 325 mg by mouth daily with breakfast.    . letrozole (FEMARA) 2.5 MG tablet Take 2.5 mg by mouth daily.     [name] levothyroxine  (SYNTHROID, LEVOTHROID) 25 MCG tablet Take 25 mcg by mouth daily.     . metoprolol (LOPRESSOR) 100 MG tablet Take 1.5 tablets (150 mg total) by mouth 2 (two) times daily. 90 tablet 3   No current facility-administered medications on file prior to visit.     Past Medical History  Diagnosis Date  . Arthritis 1990  . Breast cancer 2012    Right stage 1  . Ulcer 1980  . Edema   . Lump or mass in breast     right  . Malignant neoplasm of upper-inner quadrant of female breast     right  . Hemorrhoid   . Unspecified vitamin D deficiency   . Iron deficiency anemia, unspecified   . Hypopotassemia   . Unspecified hypothyroidism   . Goiter, specified as simple   . Unspecified disorder of kidney and ureter   . Edema   . Hyperlipidemia   . Chronic kidney disease     Most recent creatinine was 1.82  . Hypertension 1960  . Atrial fibrillation 2014    Dr Fletcher Anon     Past Surgical History  Procedure Laterality Date  . Mammosite balloon placement  Right 11/2010  . Colonoscopy  2011  . Breast lumpectomy Right 50 years ago  . Breast lumpectomy Right 2012    invasive   . Cardiac catheterization  06/2010    [facility]: Mild nonobstructive coronary artery disease with normal ejection fraction.  . Cardioversion       Family History  Problem Relation Age of Onset  . Cancer Mother  ovarian  . Hypertension Mother   . Cancer Brother     colon  . Stroke Father      Social History   Social History  . Marital Status: Single    Spouse Name: N/A  . Number of Children: N/A  . Years of Education: N/A   Occupational History  . Not on file.   Social History Main Topics  . Smoking status: Never Smoker   . Smokeless tobacco: Never Used  . Alcohol Use: No  . Drug Use: No  . Sexual Activity: Not on file   Other Topics Concern  . Not on file   Social History Narrative     ROS A 10 point review of system was performed. It is negative other than that mentioned in the history of  present illness.   PHYSICAL EXAM   BP 173/108 mmHg  Pulse 101  Ht 5\' 7"  (1.702 m)  Wt 218 lb (98.884 kg)  BMI 34.14 kg/m2 Constitutional: She is oriented to person, place, and time. She appears well-developed and well-nourished. No distress.  HENT: No nasal discharge.  Head: Normocephalic and atraumatic.  Eyes: Pupils are equal and round. No discharge.  Neck: Normal range of motion. Neck supple. Mild JVD present. No thyromegaly present.  Cardiovascular: Iregular rhythm, tachycardic normal heart sounds. Exam reveals no gallop and no friction rub. No murmur heard.  Pulmonary/Chest: Effort normal and breath sounds normal. No stridor. No respiratory distress. She has no wheezes. She has no rales. She exhibits no tenderness.  Abdominal: Soft. Bowel sounds are normal. She exhibits no distension. There is no tenderness. There is no rebound and no guarding.  Musculoskeletal: Normal range of motion. She exhibits moderate to severe edema and no tenderness.  Neurological: She is alert and oriented to person, place, and time. Coordination normal.  Skin: Skin is warm and dry. No rash noted. She is not diaphoretic. No erythema. No pallor.  Psychiatric: She has a normal mood and affect. Her behavior is normal. Judgment and thought content normal.     EKG: Atrial fibrillation  -Nonspecific ST depression   +   Diffuse nonspecific T-abnormality  -Nondiagnostic.   ABNORMAL       ASSESSMENT AND PLAN

## 2015-03-17 NOTE — Assessment & Plan Note (Signed)
Blood pressure is elevated. Hopefully this will improve with diuresis and the use of CPAP for sleep apnea.

## 2015-03-17 NOTE — Assessment & Plan Note (Addendum)
The patient has significant leg edema and increased shortness of breath with evidence of fluid overload. She does have underlying chronic kidney disease which is likely contributing to poor diuresis. At the same time, I suspect that compliance has a lot to do with her poor response. She does not follow a low-sodium diet and this was discussed with the patient and her daughter today. She currently has a home nurse and I instructed them to perform daily weights. She might not be responding well to furosemide and thus I decided to switch to torsemide 40 mg twice daily. I asked her to start taking this at early in the day. Recheck in one week with basic metabolic profile. The patient has a follow-up appointment at [facility] nephrology. She might have a component of lymphedema in addition to fluid overload.

## 2015-03-17 NOTE — Patient Instructions (Addendum)
Medication Instructions:  Your physician has recommended you make the following change in your medication:  STOP taking lasix START taking torsemide 40mg  twice per day   Labwork: Your physician recommends that you return for lab work in one week: BMET, CBC   Testing/Procedures: none  Follow-Up: Your physician recommends that you schedule a follow-up appointment in: one week with Christell Faith, PA-C Your physician recommends that you schedule a follow-up appointment in: three months with Dr. Fletcher Anon.    Any Other Special Instructions Will Be Listed Below (If Applicable).  Please weigh yourself every day, first thing in the morning, after you urinate and before you eat or drink anything. Report weight gain of more than 3lbs in a day or 5lbs in a week.

## 2015-03-17 NOTE — Assessment & Plan Note (Signed)
I strongly advised her to start using her CPAP.

## 2015-03-17 NOTE — Assessment & Plan Note (Signed)
Rate is better controlled than before but still not optimal. I'm hesitant to increase diltiazem due to significant leg edema. Digoxin is not recommended due to chronic kidney disease. She is tolerating anticoagulation but does have underlying anemia of chronic disease. Recheck CBC with labs next week.

## 2015-03-24 ENCOUNTER — Ambulatory Visit (INDEPENDENT_AMBULATORY_CARE_PROVIDER_SITE_OTHER): Payer: Medicare HMO | Admitting: Physician Assistant

## 2015-03-24 ENCOUNTER — Other Ambulatory Visit: Payer: Medicare HMO

## 2015-03-24 ENCOUNTER — Encounter: Payer: Self-pay | Admitting: Physician Assistant

## 2015-03-24 VITALS — BP 140/80 | HR 84 | Ht 67.0 in | Wt 207.0 lb

## 2015-03-24 DIAGNOSIS — I482 Chronic atrial fibrillation, unspecified: Secondary | ICD-10-CM | POA: Insufficient documentation

## 2015-03-24 DIAGNOSIS — G4733 Obstructive sleep apnea (adult) (pediatric): Secondary | ICD-10-CM

## 2015-03-24 DIAGNOSIS — I4891 Unspecified atrial fibrillation: Secondary | ICD-10-CM

## 2015-03-24 DIAGNOSIS — I5032 Chronic diastolic (congestive) heart failure: Secondary | ICD-10-CM

## 2015-03-24 DIAGNOSIS — N184 Chronic kidney disease, stage 4 (severe): Secondary | ICD-10-CM

## 2015-03-24 DIAGNOSIS — R0602 Shortness of breath: Secondary | ICD-10-CM

## 2015-03-24 DIAGNOSIS — D638 Anemia in other chronic diseases classified elsewhere: Secondary | ICD-10-CM

## 2015-03-24 DIAGNOSIS — I270 Primary pulmonary hypertension: Secondary | ICD-10-CM

## 2015-03-24 DIAGNOSIS — E785 Hyperlipidemia, unspecified: Secondary | ICD-10-CM

## 2015-03-24 DIAGNOSIS — I10 Essential (primary) hypertension: Secondary | ICD-10-CM

## 2015-03-24 DIAGNOSIS — R600 Localized edema: Secondary | ICD-10-CM

## 2015-03-24 DIAGNOSIS — I272 Pulmonary hypertension, unspecified: Secondary | ICD-10-CM

## 2015-03-24 NOTE — Progress Notes (Signed)
Cardiology Office Note:  Date of Encounter: 03/24/2015  ID: Teresa Lutz, DOB Apr 30, 1935, MRN [id number]  PCP:  Teresa Carls, MD Primary Cardiologist:  Dr. Fletcher Anon, MD  Chief Complaint  Patient presents with  . other    C/o edema. Meds reviewed verbally with pt.    HPI:  80 year old female who is here today for followup visit regarding chronic atrial fibrillation and chronic diastolic heart failure. She had cardiac catheterization done in 2012 which showed mild nonobstructive coronary artery disease. She has chronic medical conditions that include type 2 diabetes, hypertension, hyperlipidemia, chronic kidney disease stage IV, anemia of chronic disease, sleep apnea on CPAP, hypothyroidism, chronic leg bilateral edema, chronic SOB, and history of breast cancer in 2012.  Her atrial fibrillation has been treated with rate control and anticoagulation. Most recent echocardiogram in February 2016 showed an ejection fraction of 50-55%, mild mitral regurgitation, mildly dilated right and left atrium, moderate tricuspid regurgitation and mild pulmonary hypertension. At her office visit on 9/22 she reported falling recently in her yard and was not able to get up due to weakness. She did not have syncope. She reported worsening exertional dyspnea and leg edema, though her leg edema is chronic. She had been taking furosemide once daily instead of twice daily. There had also been a question about compliance with medications. She had not been following a low-sodium diet. She does have sleep apnea and reported receiving new supplies for CPAP machine which she started using.   At that visit her weight was 218 pounds, which was stable for her throughout most of 2016, though 6 pounds over her weight from September 2015. She was changed from Lasix to torsemide 20 mg daily. Since this change her weight has come down 11 pounds to 207 today. She reports feeling better. Her blood pressure has improved as well from  the 782N systolic the week prior to 562 systolic today. Her heart rate is no longer tachycardic either. She does note continued SOB, though this has been her for many years and is unchanged. She denies any chest pain, palpitations, nausea, vomiting, diaphoresis, presyncope, or syncope.   I did not get her nephrologist note until after her office visit. Apparently, on 9/27, Dr. Juleen China, MD had started her on metolazone 5 mg daily 30 mg prior to her torsemide dose. She was otherwise continued on her remaining medications. The patient had been long discharged from her appointment when I received this note, and there was no mention of metolazone on her medication list. I will add it at after I complete this note for accuracy sake.        Past Medical History  Diagnosis Date  . Arthritis 1990  . Breast cancer 2012    Right stage 1  . Ulcer 1980  . Edema   . Lump or mass in breast     right  . Malignant neoplasm of upper-inner quadrant of female breast     right  . Hemorrhoid   . Unspecified vitamin D deficiency   . Iron deficiency anemia, unspecified   . Hypopotassemia   . Unspecified hypothyroidism   . Goiter, specified as simple   . Unspecified disorder of kidney and ureter   . Edema   . Hyperlipidemia   . Chronic kidney disease     Most recent creatinine was 1.82  . Hypertension 1960  . Atrial fibrillation 2014    Dr Teresa Lutz  :  Past Surgical History  Procedure Laterality Date  .  Mammosite balloon placement  Right 11/2010  . Colonoscopy  2011  . Breast lumpectomy Right 50 years ago  . Breast lumpectomy Right 2012    invasive   . Cardiac catheterization  06/2010    [facility]: Mild nonobstructive coronary artery disease with normal ejection fraction.  . Cardioversion    :  Social History:  The patient  reports that she has never smoked. She has never used smokeless tobacco. She reports that she does not drink alcohol or use illicit drugs.   Family History  Problem Relation Age of  Onset  . Cancer Mother     ovarian  . Hypertension Mother   . Cancer Brother     colon  . Stroke Father      Allergies:  Allergies  Allergen Reactions  . Celebrex [Celecoxib] Hives  . Other Hives    Arthritis meds     Home Medications:  Current Outpatient Prescriptions  Medication Sig Dispense Refill  . apixaban (ELIQUIS) 5 MG TABS tablet Take 1 tablet (5 mg total) by mouth 2 (two) times daily. 60 tablet 6  . BENICAR 40 MG tablet Take 40 mg by mouth daily.     [name] diltiazem (CARDIZEM CD) 120 MG 24 hr capsule Take 1 capsule (120 mg total) by mouth daily. 30 capsule 6  . ferrous sulfate 325 (65 FE) MG tablet Take 325 mg by mouth daily with breakfast.    . letrozole (FEMARA) 2.5 MG tablet Take 2.5 mg by mouth daily.     [name] levothyroxine (SYNTHROID, LEVOTHROID) 25 MCG tablet Take 25 mcg by mouth daily.     . metoprolol (LOPRESSOR) 100 MG tablet Take 1.5 tablets (150 mg total) by mouth 2 (two) times daily. 90 tablet 3  . potassium chloride SA (K-DUR,KLOR-CON) 20 MEQ tablet Take 20 mEq by mouth daily.     [name] torsemide (DEMADEX) 20 MG tablet Take 2 tablets (40 mg total) by mouth 2 (two) times daily. 120 tablet 3   No current facility-administered medications for this visit.     Review of Systems:  Review of Systems  Constitutional: Positive for weight loss. Negative for fever, chills, malaise/fatigue and diaphoresis.       Diuresed 11 pounds since her last office visit   HENT: Negative for congestion.   Eyes: Negative for discharge and redness.  Respiratory: Positive for shortness of breath. Negative for cough, hemoptysis, sputum production and wheezing.   Cardiovascular: Negative for chest pain, palpitations, orthopnea, claudication, leg swelling and PND.  Gastrointestinal: Negative for nausea, vomiting, blood in stool and melena.  Genitourinary: Negative for hematuria.  Musculoskeletal: Negative for falls.  Skin: Negative for rash.  Neurological: Negative for sensory change,  speech change, focal weakness and weakness.  Endo/Heme/Allergies: Does not bruise/bleed easily.  Psychiatric/Behavioral: The patient is not nervous/anxious.      Physical Exam:  Height 5\' 7"  (1.702 m), weight 207 lb (93.895 kg). BMI: Body mass index is 32.41 kg/(m^2). General: Pleasant, NAD. Psych: Normal affect. Responds to questions with normal affect.  Neuro: Alert and oriented X 3. Moves all extremities spontaneously. HEENT: Normocephalic, atraumatic. EOM intact. Sclera anicteric.  Neck: Trachea midline. Supple without bruits or JVD. Lungs:  Respirations regular and unlabored. CTA bilaterally without wheezing, crackles, or rhonchi.  Heart: Irregularly-irregular, normal s3, s4. No murmurs, rubs, or gallops.  Abdomen: Obese, soft, non-tender, non-distended, BS + x 4.  Extremities: No clubbing or cyanosis. Moderate chronic edema along bilateral feet extending to mid calves, no TTP, erythema, or warmth.  Accessory Clinical Findings:  EKG: Afib, 84 bpm, 0.5-1 mm horizontal inferolateral st depression  Recent Labs: 03/08/2015: K+ 4.0, BUN 55, glu 113, SCr 2.63, GFR 17, Ca 10, hgb 8.7, hct 27.9%, CO2 27 08/12/2014: SCr 2.5, GFR 18  CrCl cannot be calculated (Patient has no serum creatinine result on file.).  Weights: Wt Readings from Last 3 Encounters:  03/24/15 207 lb (93.895 kg)  03/17/15 218 lb (98.884 kg)  03/03/15 214 lb (97.07 kg)    Other studies Reviewed: Additional studies/ records that were reviewed today include: previous office notes.  Assessment & Plan:  1. Chronic Afib: -Rate controlled -Continue Lopressor 100 mg 1.5 tabs bid -Continue Cardizem CD 120 mg daily, hold on further increase given her significant lower extremity edema  -Continue Eliquis 5 mg bid -CHADSVASc at least 5 (CHF, HTN, age x 2, female) -Recheck bmet and CBC -Labs from nephrology as above  2. Chronic diastolic CHF: -Down 11 pounds today with diuresis with torsemide 20 mg bid -On 9/27  nephrology added metolazone 5 mg daily to her medications 30 minutes prior to her torsemide. I was not aware of this at the time of our office visit as it was not on her updated medication list and the office note did not come to me until the patient had been discharged from the office visit. We will need to contact the patient on 9/30 and find out if she is taking this daily, and if she is taking any extra (more than prior) potassium supplement for repletion  -Continue torsemide 20 mg bid -Lopressor as above -Improved -Still complains of SOB, though this is chronic for her and unchanged from prior years -Check Lexiscan Myoview to evaluate for high risk ischemia   3. CKD stage IV: -Followed by nephrology  4. Anemia of chronic disease: -Check CBC  5. OSA: -On CPAP  Dispo: -Follow up 1 month  Current medicines are reviewed at length with the patient today.  The patient did not have any concerns regarding medicines.   Christell Faith, PA-C [facility] [address] [place], [place] [address] [phone] [place] 03/24/2015, 2:14 PM

## 2015-03-24 NOTE — Patient Instructions (Addendum)
Medication Instructions:  Your physician recommends that you continue on your current medications as directed. Please refer to the Current Medication list given to you today.   Labwork: BMET, CBC  Testing/Procedures: Your physician has requested that you have a lexiscan myoview. For further information please visit [email or web address]. Please follow instruction sheet, as given.  [place]  Your caregiver has ordered a Stress Test with nuclear imaging. The purpose of this test is to evaluate the blood supply to your heart muscle. This procedure is referred to as a "Non-Invasive Stress Test." This is because other than having an IV started in your vein, nothing is inserted or "invades" your body. Cardiac stress tests are done to find areas of poor blood flow to the heart by determining the extent of coronary artery disease (CAD). Some patients exercise on a treadmill, which naturally increases the blood flow to your heart, while others who are  unable to walk on a treadmill due to physical limitations have a pharmacologic/chemical stress agent called Lexiscan . This medicine will mimic walking on a treadmill by temporarily increasing your coronary blood flow.   Please note: these test may take anywhere between 2-4 hours to complete  PLEASE REPORT TO [place] AT THE FIRST DESK WILL DIRECT YOU WHERE TO GO  Date of Procedure: Friday, Oct 7, 10am  Arrival Time for Procedure: 9:30am Instructions regarding medication:     _xx___:  Hold metoprolol the night before procedure and morning of procedure  __xx__:  Hold other medications as follows: lasix the morning of your test  PLEASE NOTIFY THE OFFICE AT LEAST 24 HOURS IN ADVANCE IF YOU ARE UNABLE TO KEEP YOUR APPOINTMENT.  [phone] AND  PLEASE NOTIFY NUCLEAR MEDICINE AT [facility] AT LEAST 24 HOURS IN ADVANCE IF YOU ARE UNABLE TO KEEP YOUR APPOINTMENT. [phone]  How to prepare for your Myoview  test:   Do not eat or drink after midnight  No caffeine for 24 hours prior to test  No smoking 24 hours prior to test.  Your medication may be taken with water.  If your doctor stopped a medication because of this test, do not take that medication.  Ladies, please do not wear dresses.  Skirts or pants are appropriate. Please wear a short sleeve shirt.  No perfume, cologne or lotion.  Wear comfortable walking shoes. No heels!            Follow-Up: Your physician recommends that you schedule a follow-up appointment in: one month with Christell Faith, PA-C or Dr. Fletcher Anon.   Any Other Special Instructions Will Be Listed Below (If Applicable). Use ace wraps for your legs  Nuclear Medicine Exam A nuclear medicine exam is a safe and painless imaging test. It helps to detect and diagnose disease in the body as well as provide information about organ function and structure.  Nuclear scans are most often done of the:  Lungs.  Heart.  Thyroid gland.  Bones.  Abdomen. HOW A NUCLEAR MEDICINE EXAM WORKS A nuclear medicine exam works by using a radioactive tracer. The material is given either by an IV (intravenous) injection or it may be swallowed. After the tracer is in the body, it is absorbed by your body's organs. A large scanning machine that uses a special camera detects the radioactivity in your body. A computerized image is then formed regarding the area of concern. The small amounts of radioactive material used in a nuclear medicine exam are found to be medically safe.  However, because radioactive material is used, this test is not done if you are pregnant or nursing.  BEFORE THE PROCEDURE  If available, bring previous imaging studies such as x-rays, etc. with you to the exam.  Arrive early for your exam. PROCEDURE  An IV may be started before the exam begins.  Depending on the type of examination, will lie on a table or sit in a chair during the exam.  The nuclear medicine  exam will take about 30 to 60 minutes to complete. AFTER THE PROCEDURE  After your scan is completed, the image(s) will be evaluated by a specialist. It is important that you follow up with your caregiver to find out your test results.  You may return to your regular activity as instructed by your caregiver. SEEK IMMEDIATE MEDICAL CARE IF: You have shortness of breath or difficulty breathing. MAKE SURE YOU:   Understand these instructions.  Will watch your condition.  Will get help right away if you are not doing well or get worse. Document Released: 07/19/2004 Document Revised: 09/03/2011 Document Reviewed: 09/02/2008 [facility] Patient Information 2015 [place], [place]. This information is not intended to replace advice given to you by your health care provider. Make sure you discuss any questions you have with your health care provider.

## 2015-03-25 LAB — BASIC METABOLIC PANEL
BUN/Creatinine Ratio: 19 (ref 11–26)
BUN: 65 mg/dL — AB (ref 8–27)
CO2: 29 mmol/L (ref 18–29)
CREATININE: 3.39 mg/dL — AB (ref 0.57–1.00)
Calcium: 9.9 mg/dL (ref 8.7–10.3)
Chloride: 92 mmol/L — ABNORMAL LOW (ref 97–108)
GFR calc Af Amer: 14 mL/min/{1.73_m2} — ABNORMAL LOW (ref >59)
GFR calc non Af Amer: 12 mL/min/{1.73_m2} — ABNORMAL LOW (ref >59)
GLUCOSE: 138 mg/dL — AB (ref 65–99)
Potassium: 2.7 mmol/L — ABNORMAL LOW (ref 3.5–5.2)
SODIUM: 144 mmol/L (ref 134–144)

## 2015-03-25 LAB — CBC
Hematocrit: 29.2 % — ABNORMAL LOW (ref 34.0–46.6)
Hemoglobin: 9.2 g/dL — ABNORMAL LOW (ref 11.1–15.9)
MCH: 27.1 pg (ref 26.6–33.0)
MCHC: 31.5 g/dL (ref 31.5–35.7)
MCV: 86 fL (ref 79–97)
PLATELETS: 339 10*3/uL (ref 150–379)
RBC: 3.39 x10E6/uL — AB (ref 3.77–5.28)
RDW: 16.6 % — ABNORMAL HIGH (ref 12.3–15.4)
WBC: 6.6 10*3/uL (ref 3.4–10.8)

## 2015-03-28 ENCOUNTER — Other Ambulatory Visit: Payer: Self-pay

## 2015-03-28 MED ORDER — POTASSIUM CHLORIDE CRYS ER 20 MEQ PO TBCR: 20.0000 meq | EXTENDED_RELEASE_TABLET | Freq: Two times a day (BID) | ORAL | Status: DC

## 2015-03-30 ENCOUNTER — Other Ambulatory Visit: Payer: Medicare HMO

## 2015-03-31 ENCOUNTER — Other Ambulatory Visit: Payer: Medicare HMO

## 2015-04-01 ENCOUNTER — Encounter
Admission: RE | Admit: 2015-04-01 | Discharge: 2015-04-01 | Disposition: A | Discharge status: Home / Self Care | Payer: Medicare HMO | Source: Ambulatory Visit | Attending: Physician Assistant | Admitting: Physician Assistant

## 2015-04-01 DIAGNOSIS — R0602 Shortness of breath: Secondary | ICD-10-CM | POA: Insufficient documentation

## 2015-04-01 LAB — NM MYOCAR MULTI W/SPECT W/WALL MOTION / EF
CHL CUP NUCLEAR SDS: 0
CHL CUP STRESS STAGE 2 HR: 109 {beats}/min
CHL CUP STRESS STAGE 3 GRADE: 0 %
CHL CUP STRESS STAGE 3 HR: 103 {beats}/min
CHL CUP STRESS STAGE 4 GRADE: 0 %
CHL CUP STRESS STAGE 4 HR: 116 {beats}/min
CHL CUP STRESS STAGE 6 DBP: 45 mmHg
CHL CUP STRESS STAGE 6 GRADE: 0 %
CHL CUP STRESS STAGE 6 SPEED: 0 mph
CSEPPMHR: 81 %
Estimated workload: 1 METS
LV sys vol: 49 mL
LVDIAVOL: 82 mL
Peak HR: 116 {beats}/min
Percent HR: 84 %
Rest HR: 107 {beats}/min
SRS: 0
SSS: 0
Stage 1 HR: 99 {beats}/min
Stage 2 Grade: 0 %
Stage 2 Speed: 0 mph
Stage 3 Speed: 0 mph
Stage 4 Speed: 0 mph
Stage 5 Grade: 0 %
Stage 5 HR: 103 {beats}/min
Stage 5 Speed: 0 mph
Stage 6 SBP: 84 mmHg
TID: 0.95

## 2015-04-01 MED ORDER — REGADENOSON 0.4 MG/5ML IV SOLN
0.4000 mg | Freq: Once | INTRAVENOUS | Status: AC
  Administered 2015-04-01: 0.4 mg via INTRAVENOUS

## 2015-04-01 MED ORDER — TECHNETIUM TC 99M SESTAMIBI - CARDIOLITE
32.3130 | Freq: Once | INTRAVENOUS | Status: AC | PRN
  Administered 2015-04-01: 11:00:00 32.313 via INTRAVENOUS

## 2015-04-01 MED ORDER — TECHNETIUM TC 99M SESTAMIBI - CARDIOLITE
13.8140 | Freq: Once | INTRAVENOUS | Status: AC | PRN
  Administered 2015-04-01: 13.814 via INTRAVENOUS

## 2015-04-04 ENCOUNTER — Other Ambulatory Visit: Payer: Self-pay

## 2015-04-04 ENCOUNTER — Telehealth: Payer: Self-pay

## 2015-04-04 DIAGNOSIS — R0602 Shortness of breath: Secondary | ICD-10-CM

## 2015-04-04 NOTE — Telephone Encounter (Signed)
Called pt to schedule echo, line continual busy

## 2015-04-11 ENCOUNTER — Other Ambulatory Visit: Payer: Self-pay | Admitting: Cardiovascular Disease

## 2015-04-11 MED ORDER — DILTIAZEM HCL ER COATED BEADS 120 MG PO CP24: 120.0000 mg | ORAL_CAPSULE | Freq: Every day | ORAL | Status: DC

## 2015-04-15 ENCOUNTER — Inpatient Hospital Stay: Payer: Medicare HMO | Attending: Oncology | Admitting: Oncology

## 2015-04-15 VITALS — BP 136/91 | HR 117 | Temp 98.9°F | Resp 18 | Wt 192.4 lb

## 2015-04-15 DIAGNOSIS — I272 Other secondary pulmonary hypertension: Secondary | ICD-10-CM | POA: Diagnosis not present

## 2015-04-15 DIAGNOSIS — D631 Anemia in chronic kidney disease: Secondary | ICD-10-CM

## 2015-04-15 DIAGNOSIS — E876 Hypokalemia: Secondary | ICD-10-CM | POA: Diagnosis not present

## 2015-04-15 DIAGNOSIS — E559 Vitamin D deficiency, unspecified: Secondary | ICD-10-CM

## 2015-04-15 DIAGNOSIS — I129 Hypertensive chronic kidney disease with stage 1 through stage 4 chronic kidney disease, or unspecified chronic kidney disease: Secondary | ICD-10-CM

## 2015-04-15 DIAGNOSIS — E039 Hypothyroidism, unspecified: Secondary | ICD-10-CM

## 2015-04-15 DIAGNOSIS — E785 Hyperlipidemia, unspecified: Secondary | ICD-10-CM

## 2015-04-15 DIAGNOSIS — Z853 Personal history of malignant neoplasm of breast: Secondary | ICD-10-CM | POA: Diagnosis not present

## 2015-04-15 DIAGNOSIS — N189 Chronic kidney disease, unspecified: Secondary | ICD-10-CM

## 2015-04-15 DIAGNOSIS — I482 Chronic atrial fibrillation: Secondary | ICD-10-CM

## 2015-04-15 DIAGNOSIS — I5032 Chronic diastolic (congestive) heart failure: Secondary | ICD-10-CM

## 2015-04-15 DIAGNOSIS — N184 Chronic kidney disease, stage 4 (severe): Secondary | ICD-10-CM

## 2015-04-15 DIAGNOSIS — Z7982 Long term (current) use of aspirin: Secondary | ICD-10-CM

## 2015-04-15 DIAGNOSIS — Z79899 Other long term (current) drug therapy: Secondary | ICD-10-CM | POA: Diagnosis not present

## 2015-04-15 DIAGNOSIS — Z8711 Personal history of peptic ulcer disease: Secondary | ICD-10-CM | POA: Diagnosis not present

## 2015-04-15 LAB — CBC
HCT: 28.6 % — ABNORMAL LOW (ref 35.0–47.0)
HEMOGLOBIN: 9.3 g/dL — AB (ref 12.0–16.0)
MCH: 28.2 pg (ref 26.0–34.0)
MCHC: 32.5 g/dL (ref 32.0–36.0)
MCV: 86.8 fL (ref 80.0–100.0)
Platelets: 234 10*3/uL (ref 150–440)
RBC: 3.29 MIL/uL — AB (ref 3.80–5.20)
RDW: 17.9 % — ABNORMAL HIGH (ref 11.5–14.5)
WBC: 4.8 10*3/uL (ref 3.6–11.0)

## 2015-04-15 LAB — DAT, POLYSPECIFIC AHG (ARMC ONLY): POLYSPECIFIC AHG TEST: NEGATIVE

## 2015-04-15 LAB — LACTATE DEHYDROGENASE: LDH: 161 U/L (ref 98–192)

## 2015-04-16 LAB — ERYTHROPOIETIN: Erythropoietin: 19.9 m[IU]/mL — ABNORMAL HIGH (ref 2.6–18.5)

## 2015-04-16 LAB — VITAMIN B12: Vitamin B-12: 241 pg/mL (ref 180–914)

## 2015-04-16 LAB — HAPTOGLOBIN: Haptoglobin: 288 mg/dL — ABNORMAL HIGH (ref 34–200)

## 2015-04-18 LAB — IRON AND TIBC
IRON: 86 ug/dL (ref 28–170)
Saturation Ratios: 25 % (ref 10.4–31.8)
TIBC: 340 ug/dL (ref 250–450)
UIBC: 254 ug/dL

## 2015-04-18 LAB — FOLATE: FOLATE: 7.9 ng/mL (ref >5.9)

## 2015-04-18 LAB — FERRITIN: Ferritin: 91 ng/mL (ref 11–307)

## 2015-04-20 NOTE — Progress Notes (Signed)
Teresa Lutz  [phone] [phone]  ID: Teresa Lutz OB: Jun 14, 1935  MR#: [id number]  CWC#:[id number]  Patient Care Team: Casilda Carls, MD as PCP - General (Internal Medicine) Christene Lye, MD (General Surgery)  CHIEF COMPLAINT:  Chief Complaint  Patient presents with  . New Patient (Initial Visit)    referrel for anemia    INTERVAL HISTORY: Patient is a 80 year old female who was found to have a declining hemoglobin on routine blood work. She currently feels well and is asymptomatic. She does not complain of weakness or fatigue today. She has no neurologic complaints. She denies any recent fevers or illnesses. She has no chest pain or shortness of breath. She denies any nausea, vomiting, constipation, or diarrhea. She has no melena or hematochezia. She has no urinary complaints. Patient feels at her baseline and offers no specific complaints today.  REVIEW OF SYSTEMS:   Review of Systems  Constitutional: Negative.  Negative for malaise/fatigue.  Respiratory: Negative.  Negative for shortness of breath.   Cardiovascular: Negative.   Gastrointestinal: Negative.  Negative for blood in stool and melena.  Neurological: Negative.  Negative for weakness.    As per HPI. Otherwise, a complete review of systems is negatve.  PAST MEDICAL HISTORY: Past Medical History  Diagnosis Date  . Arthritis 1990  . Breast cancer 2012    Right stage 1  . Ulcer 1980  . Lump or mass in breast     right  . Malignant neoplasm of upper-inner quadrant of female breast     right  . Hemorrhoid   . Unspecified vitamin D deficiency   . Anemia of chronic disease   . Hypopotassemia   . Unspecified hypothyroidism   . Goiter, specified as simple   . Hyperlipidemia   . CKD (chronic kidney disease), stage IV     Most recent creatinine was 1.82  . Hypertension 1960  . Chronic atrial fibrillation 2014    a. Eliquis 5 mg bid; CHADSVASc 5 (CHF, HTN, age x  2, female)  . Lower extremity edema   . Chronic diastolic CHF (congestive heart failure)   . Pulmonary HTN     PAST SURGICAL HISTORY: Past Surgical History  Procedure Laterality Date  . Mammosite balloon placement  Right 11/2010  . Colonoscopy  2011  . Breast lumpectomy Right 50 years ago  . Breast lumpectomy Right 2012    invasive   . Cardiac catheterization  06/2010    [facility]: Mild nonobstructive coronary artery disease with normal ejection fraction.  . Cardioversion      FAMILY HISTORY Family History  Problem Relation Age of Onset  . Cancer Mother     ovarian  . Hypertension Mother   . Cancer Brother     colon  . Stroke Father        ADVANCED DIRECTIVES:    HEALTH MAINTENANCE: Social History  Substance Use Topics  . Smoking status: Never Smoker   . Smokeless tobacco: Never Used  . Alcohol Use: No     Colonoscopy:  PAP:  Bone density:  Lipid panel:  Allergies  Allergen Reactions  . Celebrex [Celecoxib] Hives  . Other Hives    Arthritis meds    Current Outpatient Prescriptions  Medication Sig Dispense Refill  . apixaban (ELIQUIS) 5 MG TABS tablet Take 1 tablet (5 mg total) by mouth 2 (two) times daily. 60 tablet 6  . aspirin 81 MG chewable tablet Chew 81 mg by mouth daily.    [name]  BENICAR 40 MG tablet Take 40 mg by mouth daily.     [name] diltiazem (CARDIZEM CD) 120 MG 24 hr capsule Take 1 capsule (120 mg total) by mouth daily. 30 capsule 6  . ferrous sulfate 325 (65 FE) MG tablet Take 325 mg by mouth daily with breakfast.    . letrozole (FEMARA) 2.5 MG tablet Take 2.5 mg by mouth daily.     [name] levothyroxine (SYNTHROID, LEVOTHROID) 25 MCG tablet Take 25 mcg by mouth daily.     . metolazone (ZAROXOLYN) 5 MG tablet Take 5 mg by mouth daily.    . metoprolol (LOPRESSOR) 100 MG tablet Take 1.5 tablets (150 mg total) by mouth 2 (two) times daily. 90 tablet 3  . potassium chloride SA (K-DUR,KLOR-CON) 20 MEQ tablet Take 1 tablet (20 mEq total) by mouth 2 (two) times  daily. 60 tablet 3  . torsemide (DEMADEX) 20 MG tablet Take 2 tablets (40 mg total) by mouth 2 (two) times daily. 120 tablet 3   No current facility-administered medications for this visit.    OBJECTIVE: Filed Vitals:   04/15/15 1213  BP: 136/91  Pulse: 117  Temp: 98.9 F (37.2 C)  Resp: 18     Body mass index is 30.12 kg/(m^2).    ECOG FS:0 - Asymptomatic  General: Well-developed, well-nourished, no acute distress. Eyes: Pink conjunctiva, anicteric sclera. Lungs: Clear to auscultation bilaterally. Heart: Regular rate and rhythm. No rubs, murmurs, or gallops. Abdomen: Soft, nontender, nondistended. No organomegaly noted, normoactive bowel sounds. Musculoskeletal: No edema, cyanosis, or clubbing. Neuro: Alert, answering all questions appropriately. Cranial nerves grossly intact. Skin: No rashes or petechiae noted. Psych: Normal affect.   LAB RESULTS:  Lab Results  Component Value Date   NA 144 03/24/2015   K 2.7* 03/24/2015   CL 92* 03/24/2015   CO2 29 03/24/2015   GLUCOSE 138* 03/24/2015   BUN 65* 03/24/2015   CREATININE 3.39* 03/24/2015   CALCIUM 9.9 03/24/2015   PROT 8.3* 03/18/2014   ALBUMIN 2.6* 03/18/2014   AST 17 03/18/2014   ALT 13* 03/18/2014   ALKPHOS 69 03/18/2014   BILITOT 0.3 03/18/2014   GFRNONAA 12* 03/24/2015   GFRAA 14* 03/24/2015    Lab Results  Component Value Date   WBC 4.8 04/15/2015   NEUTROABS 4.5 10/27/2013   HGB 9.3* 04/15/2015   HCT 28.6* 04/15/2015   MCV 86.8 04/15/2015   PLT 234 04/15/2015     STUDIES: Nm Myocar Multi W/spect W/wall Motion / Ef  04/01/2015  Pharmacological myocardial perfusion imaging study with no significant ischemia EF estimated at 28% (depressed likely secondary to arrhythmia/atrial fibrillation) Unable to determine regions of wall motion abnormality secondary to arrhythmia. No EKG changes concerning for ischemia. Non-specific ST ABN with lexiscan infusion. Low risk scan Consider echocardiogram if clinically  indicated to verify cardiac function/EF   ASSESSMENT: Anemia secondary to chronic renal insufficiency.  PLAN:    1. Anemia: Secondary to chronic renal insufficiency. Patient's iron stores as well as the remainder of her workup was either negative or within normal limits. Patient will benefit from 40,000 units of Procrit. Return to clinic in approximately one week for further evaluation and initiation of treatment. 2. Breast cancer: Unclear stage. Patient is currently taking Femara. 3. Chronic renal insufficiency: Patient's creatinine appears to be approximately her baseline of 3.4. Treatment per nephrology.  If patient progresses to dialysis, she likely will can receive her Procrit there and will no longer require follow-up. 4. Congestive heart failure: Patient's EF is  estimated 28%.  Patient expressed understanding and was in agreement with this plan. She also understands that She can call clinic at any time with any questions, concerns, or complaints.    Lloyd Huger, MD   04/20/2015 3:54 PM

## 2015-04-21 ENCOUNTER — Inpatient Hospital Stay: Payer: Medicare HMO | Admitting: Oncology

## 2015-04-21 ENCOUNTER — Inpatient Hospital Stay: Payer: Medicare HMO

## 2015-04-21 ENCOUNTER — Ambulatory Visit: Payer: Medicare HMO

## 2015-04-22 ENCOUNTER — Inpatient Hospital Stay: Payer: Medicare HMO

## 2015-04-28 ENCOUNTER — Ambulatory Visit: Payer: Medicare HMO | Admitting: Oncology

## 2015-04-28 ENCOUNTER — Other Ambulatory Visit: Payer: Medicare HMO

## 2015-04-28 ENCOUNTER — Ambulatory Visit: Payer: Medicare HMO

## 2015-05-02 ENCOUNTER — Other Ambulatory Visit: Payer: Self-pay

## 2015-05-02 ENCOUNTER — Ambulatory Visit (INDEPENDENT_AMBULATORY_CARE_PROVIDER_SITE_OTHER): Payer: Medicare HMO

## 2015-05-02 DIAGNOSIS — R0602 Shortness of breath: Secondary | ICD-10-CM

## 2015-05-06 ENCOUNTER — Other Ambulatory Visit: Payer: Self-pay | Admitting: *Deleted

## 2015-05-06 DIAGNOSIS — D638 Anemia in other chronic diseases classified elsewhere: Secondary | ICD-10-CM

## 2015-05-09 ENCOUNTER — Inpatient Hospital Stay: Payer: Medicare HMO

## 2015-05-09 ENCOUNTER — Inpatient Hospital Stay: Payer: Medicare HMO | Attending: Oncology | Admitting: Oncology

## 2015-05-09 VITALS — BP 122/80 | HR 82 | Temp 96.4°F | Resp 16

## 2015-05-09 DIAGNOSIS — E785 Hyperlipidemia, unspecified: Secondary | ICD-10-CM | POA: Diagnosis not present

## 2015-05-09 DIAGNOSIS — E876 Hypokalemia: Secondary | ICD-10-CM | POA: Diagnosis not present

## 2015-05-09 DIAGNOSIS — D631 Anemia in chronic kidney disease: Secondary | ICD-10-CM | POA: Insufficient documentation

## 2015-05-09 DIAGNOSIS — E039 Hypothyroidism, unspecified: Secondary | ICD-10-CM | POA: Diagnosis not present

## 2015-05-09 DIAGNOSIS — E559 Vitamin D deficiency, unspecified: Secondary | ICD-10-CM | POA: Diagnosis not present

## 2015-05-09 DIAGNOSIS — Z170 Estrogen receptor positive status [ER+]: Secondary | ICD-10-CM | POA: Diagnosis not present

## 2015-05-09 DIAGNOSIS — I272 Other secondary pulmonary hypertension: Secondary | ICD-10-CM | POA: Diagnosis not present

## 2015-05-09 DIAGNOSIS — I5032 Chronic diastolic (congestive) heart failure: Secondary | ICD-10-CM

## 2015-05-09 DIAGNOSIS — Z8041 Family history of malignant neoplasm of ovary: Secondary | ICD-10-CM | POA: Diagnosis not present

## 2015-05-09 DIAGNOSIS — C50411 Malignant neoplasm of upper-outer quadrant of right female breast: Secondary | ICD-10-CM | POA: Diagnosis not present

## 2015-05-09 DIAGNOSIS — Z800 Family history of malignant neoplasm of digestive organs: Secondary | ICD-10-CM

## 2015-05-09 DIAGNOSIS — Z7901 Long term (current) use of anticoagulants: Secondary | ICD-10-CM | POA: Diagnosis not present

## 2015-05-09 DIAGNOSIS — D638 Anemia in other chronic diseases classified elsewhere: Secondary | ICD-10-CM

## 2015-05-09 DIAGNOSIS — I129 Hypertensive chronic kidney disease with stage 1 through stage 4 chronic kidney disease, or unspecified chronic kidney disease: Secondary | ICD-10-CM

## 2015-05-09 DIAGNOSIS — N184 Chronic kidney disease, stage 4 (severe): Secondary | ICD-10-CM

## 2015-05-09 DIAGNOSIS — Z79899 Other long term (current) drug therapy: Secondary | ICD-10-CM

## 2015-05-09 DIAGNOSIS — Z8719 Personal history of other diseases of the digestive system: Secondary | ICD-10-CM | POA: Insufficient documentation

## 2015-05-09 DIAGNOSIS — I482 Chronic atrial fibrillation: Secondary | ICD-10-CM | POA: Diagnosis not present

## 2015-05-09 DIAGNOSIS — Z79811 Long term (current) use of aromatase inhibitors: Secondary | ICD-10-CM | POA: Insufficient documentation

## 2015-05-09 DIAGNOSIS — N189 Chronic kidney disease, unspecified: Secondary | ICD-10-CM

## 2015-05-09 LAB — CBC WITH DIFFERENTIAL/PLATELET
Basophils Absolute: 0 10*3/uL (ref 0–0.1)
Basophils Relative: 0 %
EOS ABS: 0 10*3/uL (ref 0–0.7)
Eosinophils Relative: 0 %
HEMATOCRIT: 32.5 % — AB (ref 35.0–47.0)
HEMOGLOBIN: 10.2 g/dL — AB (ref 12.0–16.0)
LYMPHS ABS: 0.6 10*3/uL — AB (ref 1.0–3.6)
Lymphocytes Relative: 8 %
MCH: 27.9 pg (ref 26.0–34.0)
MCHC: 31.4 g/dL — ABNORMAL LOW (ref 32.0–36.0)
MCV: 88.8 fL (ref 80.0–100.0)
MONO ABS: 0.5 10*3/uL (ref 0.2–0.9)
MONOS PCT: 6 %
NEUTROS ABS: 6.2 10*3/uL (ref 1.4–6.5)
NEUTROS PCT: 86 %
Platelets: 317 10*3/uL (ref 150–440)
RBC: 3.66 MIL/uL — ABNORMAL LOW (ref 3.80–5.20)
RDW: 18.2 % — ABNORMAL HIGH (ref 11.5–14.5)
WBC: 7.2 10*3/uL (ref 3.6–11.0)

## 2015-05-09 LAB — IRON AND TIBC
Iron: 56 ug/dL (ref 28–170)
Saturation Ratios: 19 % (ref 10.4–31.8)
TIBC: 289 ug/dL (ref 250–450)
UIBC: 233 ug/dL

## 2015-05-09 LAB — FERRITIN: FERRITIN: 96 ng/mL (ref 11–307)

## 2015-05-12 ENCOUNTER — Encounter (INDEPENDENT_AMBULATORY_CARE_PROVIDER_SITE_OTHER): Payer: Medicare HMO | Admitting: Ophthalmology

## 2015-05-12 DIAGNOSIS — I10 Essential (primary) hypertension: Secondary | ICD-10-CM

## 2015-05-12 DIAGNOSIS — H43813 Vitreous degeneration, bilateral: Secondary | ICD-10-CM

## 2015-05-12 DIAGNOSIS — H34812 Central retinal vein occlusion, left eye, with macular edema: Secondary | ICD-10-CM

## 2015-05-12 DIAGNOSIS — H35033 Hypertensive retinopathy, bilateral: Secondary | ICD-10-CM

## 2015-05-16 ENCOUNTER — Encounter: Payer: Self-pay | Admitting: Cardiovascular Disease

## 2015-05-16 ENCOUNTER — Ambulatory Visit (INDEPENDENT_AMBULATORY_CARE_PROVIDER_SITE_OTHER): Payer: Medicare HMO | Admitting: Cardiovascular Disease

## 2015-05-16 VITALS — BP 112/60 | HR 88 | Ht 67.0 in | Wt 192.0 lb

## 2015-05-16 DIAGNOSIS — I10 Essential (primary) hypertension: Secondary | ICD-10-CM

## 2015-05-16 DIAGNOSIS — I482 Chronic atrial fibrillation, unspecified: Secondary | ICD-10-CM

## 2015-05-16 DIAGNOSIS — I5032 Chronic diastolic (congestive) heart failure: Secondary | ICD-10-CM | POA: Diagnosis not present

## 2015-05-16 MED ORDER — POTASSIUM CHLORIDE CRYS ER 20 MEQ PO TBCR: 20.0000 meq | EXTENDED_RELEASE_TABLET | Freq: Every day | ORAL | Status: DC

## 2015-05-16 MED ORDER — TORSEMIDE 20 MG PO TABS: 40.0000 mg | ORAL_TABLET | Freq: Every day | ORAL | Status: DC

## 2015-05-16 NOTE — Progress Notes (Signed)
Primary care physician: Dr. Rosario Jacks Nephrologist: Dr. Juanito Doom but she is switching to Dr. Rolly Salter  HPI  This is a 80 year old African American female who is here today for followup visit regarding chronic atrial fibrillation and chronic diastolic heart failure. She had cardiac catheterization done in 2012 which showed mild nonobstructive coronary artery disease. She has chronic medical conditions that include type 2 diabetes, hypertension, hyperlipidemia, chronic kidney disease, anemia of chronic disease, sleep apnea on CPAP, hypothyroidism, chronic leg edema and history of breast cancer.  She was seen in September for significant edema, shortness of breath and weight gain. Her weight was 212 pounds and she was in atrial fibrillation with rapid ventricular response. I switched her from furosemide to torsemide. She underwent a nuclear stress test which showed no evidence of ischemia but EF calculation was not accurate. An echocardiogram was done this month which showed normal LV systolic function, mildly dilated left atrium and mildly elevated pulmonary pressure at 41 mmHg. She is feeling significantly better and her weight is down to 192 pounds. Dyspnea improved significantly.   Allergies  Allergen Reactions  . Celebrex [Celecoxib] Hives  . Other Hives    Arthritis meds     Current Outpatient Prescriptions on File Prior to Visit  Medication Sig Dispense Refill  . apixaban (ELIQUIS) 5 MG TABS tablet Take 1 tablet (5 mg total) by mouth 2 (two) times daily. 60 tablet 6  . aspirin 81 MG chewable tablet Chew 81 mg by mouth daily.    [name] BENICAR 40 MG tablet Take 40 mg by mouth daily.     [name] diltiazem (CARDIZEM CD) 120 MG 24 hr capsule Take 1 capsule (120 mg total) by mouth daily. 30 capsule 6  . ferrous sulfate 325 (65 FE) MG tablet Take 325 mg by mouth daily with breakfast.    . letrozole (FEMARA) 2.5 MG tablet Take 2.5 mg by mouth daily.     [name] levothyroxine (SYNTHROID, LEVOTHROID) 25 MCG  tablet Take 25 mcg by mouth daily.     . metolazone (ZAROXOLYN) 5 MG tablet Take 5 mg by mouth daily.    . metoprolol (LOPRESSOR) 100 MG tablet Take 1.5 tablets (150 mg total) by mouth 2 (two) times daily. 90 tablet 3  . potassium chloride SA (K-DUR,KLOR-CON) 20 MEQ tablet Take 1 tablet (20 mEq total) by mouth 2 (two) times daily. 60 tablet 3  . torsemide (DEMADEX) 20 MG tablet Take 2 tablets (40 mg total) by mouth 2 (two) times daily. 120 tablet 3   No current facility-administered medications on file prior to visit.     Past Medical History  Diagnosis Date  . Arthritis 1990  . Breast cancer ([place]) 2012    Right stage 1  . Ulcer 1980  . Lump or mass in breast     right  . Malignant neoplasm of upper-inner quadrant of female breast ([place])     right  . Hemorrhoid   . Unspecified vitamin D deficiency   . Anemia of chronic disease   . Hypopotassemia   . Unspecified hypothyroidism   . Goiter, specified as simple   . Hyperlipidemia   . CKD (chronic kidney disease), stage IV ([place])     Most recent creatinine was 1.82  . Hypertension 1960  . Chronic atrial fibrillation ([place]) 2014    a. Eliquis 5 mg bid; CHADSVASc 5 (CHF, HTN, age x 2, female)  . Lower extremity edema   . Chronic diastolic CHF (congestive heart failure) ([place])   .  Pulmonary HTN ([place])      Past Surgical History  Procedure Laterality Date  . Mammosite balloon placement  Right 11/2010  . Colonoscopy  2011  . Breast lumpectomy Right 50 years ago  . Breast lumpectomy Right 2012    invasive   . Cardiac catheterization  06/2010    [facility]: Mild nonobstructive coronary artery disease with normal ejection fraction.  . Cardioversion       Family History  Problem Relation Age of Onset  . Cancer Mother     ovarian  . Hypertension Mother   . Cancer Brother     colon  . Stroke Father      Social History   Social History  . Marital Status: Single    Spouse Name: N/A  . Number of Children: N/A  . Years of  Education: N/A   Occupational History  . Not on file.   Social History Main Topics  . Smoking status: Never Smoker   . Smokeless tobacco: Never Used  . Alcohol Use: No  . Drug Use: No  . Sexual Activity: Not on file   Other Topics Concern  . Not on file   Social History Narrative     ROS A 10 point review of system was performed. It is negative other than that mentioned in the history of present illness.   PHYSICAL EXAM   BP 112/60 mmHg  Pulse 88  Ht 5\' 7"  (1.702 m)  Wt 192 lb (87.091 kg)  BMI 30.06 kg/m2 Constitutional: She is oriented to person, place, and time. She appears well-developed and well-nourished. No distress.  HENT: No nasal discharge.  Head: Normocephalic and atraumatic.  Eyes: Pupils are equal and round. No discharge.  Neck: Normal range of motion. Neck supple.No JVD present. No thyromegaly present.  Cardiovascular: Iregular rhythm,normal rate,  normal heart sounds. Exam reveals no gallop and no friction rub. No murmur heard.  Pulmonary/Chest: Effort normal and breath sounds normal. No stridor. No respiratory distress. She has no wheezes. She has no rales. She exhibits no tenderness.  Abdominal: Soft. Bowel sounds are normal. She exhibits no distension. There is no tenderness. There is no rebound and no guarding.  Musculoskeletal: Normal range of motion. She exhibits trace  edema and no tenderness.  Neurological: She is alert and oriented to person, place, and time. Coordination normal.  Skin: Skin is warm and dry. No rash noted. She is not diaphoretic. No erythema. No pallor.  Psychiatric: She has a normal mood and affect. Her behavior is normal. Judgment and thought content normal.         ASSESSMENT AND PLAN

## 2015-05-16 NOTE — Assessment & Plan Note (Signed)
Ventricular rate is controlled with metoprolol and diltiazem. She is tolerating anticoagulation. She does have underlying anemia of chronic disease. I asked her to discontinue aspirin as she is on anticoagulation.

## 2015-05-16 NOTE — Assessment & Plan Note (Signed)
The patient improved significantly after switching her to torsemide. She has underlying chronic kidney disease. She is currently taking torsemide 40 mg once daily. She uses metolazone as needed. I made no changes in her medications and advised her to keep her follow-up with nephrology. Most recent creatinine was around 3.5.

## 2015-05-16 NOTE — Patient Instructions (Signed)
Medication Instructions:  Your physician has recommended you make the following change in your medication:  STOP taking aspirin Continue potassium 20mg  once per day Continue torsemide 40mg  once per day   Labwork: none  Testing/Procedures: none  Follow-Up: Your physician recommends that you schedule a follow-up appointment in: three months with Dr. Fletcher Anon.    Any Other Special Instructions Will Be Listed Below (If Applicable).     If you need a refill on your cardiac medications before your next appointment, please call your pharmacy.

## 2015-05-16 NOTE — Assessment & Plan Note (Signed)
Blood pressure is controlled on current medications. 

## 2015-05-24 NOTE — Progress Notes (Signed)
[facility]  [phone] [phone]  ID: [name] OB: 1934/10/03  MR#: [id number]  [id number]  Patient Care Team: Casilda Carls, MD as PCP - General (Internal Medicine) Christene Lye, MD (General Surgery)  CHIEF COMPLAINT:  Chief Complaint  Patient presents with  . Anemia  . Breast Cancer    INTERVAL HISTORY: Patient returns to clinic today for repeat laboratory work, further evaluation, and consideration of Procrit. She continues to feel well and is asymptomatic. She does not complain of weakness or fatigue today. She has no neurologic complaints. She denies any recent fevers or illnesses. She has no chest pain or shortness of breath. She denies any nausea, vomiting, constipation, or diarrhea. She has no melena or hematochezia. She has no urinary complaints. Patient offers no specific complaints today.  REVIEW OF SYSTEMS:   Review of Systems  Constitutional: Negative.  Negative for malaise/fatigue.  Respiratory: Negative.  Negative for shortness of breath.   Cardiovascular: Negative.   Gastrointestinal: Negative.  Negative for blood in stool and melena.  Neurological: Negative.  Negative for weakness.    As per HPI. Otherwise, a complete review of systems is negatve.  PAST MEDICAL HISTORY: Past Medical History  Diagnosis Date  . Arthritis 1990  . Breast cancer ([place]) 2012    Right stage 1  . Ulcer 1980  . Lump or mass in breast     right  . Malignant neoplasm of upper-inner quadrant of female breast ([place])     right  . Hemorrhoid   . Unspecified vitamin D deficiency   . Anemia of chronic disease   . Hypopotassemia   . Unspecified hypothyroidism   . Goiter, specified as simple   . Hyperlipidemia   . CKD (chronic kidney disease), stage IV ([place])     Most recent creatinine was 1.82  . Hypertension 1960  . Chronic atrial fibrillation ([place]) 2014    a. Eliquis 5 mg bid; CHADSVASc 5 (CHF, HTN, age x 2, female)  .  Lower extremity edema   . Chronic diastolic CHF (congestive heart failure) ([place])   . Pulmonary HTN ([place])     PAST SURGICAL HISTORY: Past Surgical History  Procedure Laterality Date  . Mammosite balloon placement  Right 11/2010  . Colonoscopy  2011  . Breast lumpectomy Right 50 years ago  . Breast lumpectomy Right 2012    invasive   . Cardiac catheterization  06/2010    [facility]: Mild nonobstructive coronary artery disease with normal ejection fraction.  . Cardioversion      FAMILY HISTORY Family History  Problem Relation Age of Onset  . Cancer Mother     ovarian  . Hypertension Mother   . Cancer Brother     colon  . Stroke Father        ADVANCED DIRECTIVES:    HEALTH MAINTENANCE: Social History  Substance Use Topics  . Smoking status: Never Smoker   . Smokeless tobacco: Never Used  . Alcohol Use: No     Colonoscopy:  PAP:  Bone density:  Lipid panel:  Allergies  Allergen Reactions  . Celebrex [Celecoxib] Hives  . Other Hives    Arthritis meds    Current Outpatient Prescriptions  Medication Sig Dispense Refill  . apixaban (ELIQUIS) 5 MG TABS tablet Take 1 tablet (5 mg total) by mouth 2 (two) times daily. 60 tablet 6  . BENICAR 40 MG tablet Take 40 mg by mouth daily.     [name] diltiazem (CARDIZEM CD) 120 MG  24 hr capsule Take 1 capsule (120 mg total) by mouth daily. 30 capsule 6  . ferrous sulfate 325 (65 FE) MG tablet Take 325 mg by mouth daily with breakfast.    . letrozole (FEMARA) 2.5 MG tablet Take 2.5 mg by mouth daily.     [name] levothyroxine (SYNTHROID, LEVOTHROID) 25 MCG tablet Take 25 mcg by mouth daily.     . metolazone (ZAROXOLYN) 5 MG tablet Take 5 mg by mouth daily as needed.     . metoprolol (LOPRESSOR) 100 MG tablet Take 1.5 tablets (150 mg total) by mouth 2 (two) times daily. 90 tablet 3  . potassium chloride SA (K-DUR,KLOR-CON) 20 MEQ tablet Take 1 tablet (20 mEq total) by mouth daily. 60 tablet 3  . torsemide (DEMADEX) 20 MG tablet Take 2 tablets  (40 mg total) by mouth daily. 120 tablet 3   No current facility-administered medications for this visit.    OBJECTIVE: Filed Vitals:   05/09/15 1507  BP: 122/80  Pulse: 82  Temp: 96.4 F (35.8 C)  Resp: 16     There is no weight on file to calculate BMI.    ECOG FS:0 - Asymptomatic  General: Well-developed, well-nourished, no acute distress. Eyes: Pink conjunctiva, anicteric sclera. Lungs: Clear to auscultation bilaterally. Heart: Regular rate and rhythm. No rubs, murmurs, or gallops. Abdomen: Soft, nontender, nondistended. No organomegaly noted, normoactive bowel sounds. Musculoskeletal: No edema, cyanosis, or clubbing. Neuro: Alert, answering all questions appropriately. Cranial nerves grossly intact. Skin: No rashes or petechiae noted. Psych: Normal affect.   LAB RESULTS:  Lab Results  Component Value Date   NA 144 03/24/2015   K 2.7* 03/24/2015   CL 92* 03/24/2015   CO2 29 03/24/2015   GLUCOSE 138* 03/24/2015   BUN 65* 03/24/2015   CREATININE 3.39* 03/24/2015   CALCIUM 9.9 03/24/2015   PROT 8.3* 03/18/2014   ALBUMIN 2.6* 03/18/2014   AST 17 03/18/2014   ALT 13* 03/18/2014   ALKPHOS 69 03/18/2014   BILITOT 0.3 03/18/2014   GFRNONAA 12* 03/24/2015   GFRAA 14* 03/24/2015    Lab Results  Component Value Date   WBC 7.2 05/09/2015   NEUTROABS 6.2 05/09/2015   HGB 10.2* 05/09/2015   HCT 32.5* 05/09/2015   MCV 88.8 05/09/2015   PLT 317 05/09/2015     STUDIES: No results found.  ASSESSMENT: Anemia secondary to chronic renal insufficiency.  PLAN:    1. Anemia: Secondary to chronic renal insufficiency. Patient's iron stores as well as the remainder of her workup was either negative or within normal limits. Patient's hemoglobin is greater than 10.0, therefore she does not require Procrit today. Return to clinic every 4 weeks for laboratory work and 40,000 units of Procrit if her hemoglobin falls below 10.0. She will then return to clinic in 4 months for  repeat laboratory work and further evaluation.  2. Breast cancer: Unclear stage. Patient is currently taking Femara. 3. Chronic renal insufficiency: Patient's creatinine appears to be approximately her baseline of 3.4. Treatment per nephrology.  If patient progresses to dialysis, she likely will can receive her Procrit there and will no longer require follow-up. 4. Congestive heart failure: Patient's EF is estimated 28%.  Patient expressed understanding and was in agreement with this plan. She also understands that She can call clinic at any time with any questions, concerns, or complaints.    Lloyd Huger, MD   05/24/2015 4:57 PM

## 2015-06-03 ENCOUNTER — Other Ambulatory Visit: Payer: Self-pay | Admitting: *Deleted

## 2015-06-03 DIAGNOSIS — D638 Anemia in other chronic diseases classified elsewhere: Secondary | ICD-10-CM

## 2015-06-06 ENCOUNTER — Inpatient Hospital Stay: Payer: Medicare HMO | Attending: Oncology

## 2015-06-06 ENCOUNTER — Inpatient Hospital Stay: Payer: Medicare HMO

## 2015-06-06 DIAGNOSIS — Z853 Personal history of malignant neoplasm of breast: Secondary | ICD-10-CM | POA: Insufficient documentation

## 2015-06-07 ENCOUNTER — Ambulatory Visit: Payer: Medicare HMO | Admitting: Internal Medicine

## 2015-06-09 ENCOUNTER — Inpatient Hospital Stay: Payer: Medicare HMO

## 2015-06-09 ENCOUNTER — Ambulatory Visit
Admission: RE | Admit: 2015-06-09 | Discharge: 2015-06-09 | Disposition: A | Discharge status: Home / Self Care | Payer: Medicare HMO | Source: Ambulatory Visit | Attending: Radiation Oncology | Admitting: Radiation Oncology

## 2015-06-09 ENCOUNTER — Encounter: Payer: Self-pay | Admitting: Radiation Oncology

## 2015-06-09 VITALS — BP 127/83 | HR 90 | Temp 99.2°F | Resp 18 | Wt 198.6 lb

## 2015-06-09 DIAGNOSIS — Z853 Personal history of malignant neoplasm of breast: Secondary | ICD-10-CM | POA: Diagnosis not present

## 2015-06-09 DIAGNOSIS — D638 Anemia in other chronic diseases classified elsewhere: Secondary | ICD-10-CM

## 2015-06-09 LAB — CBC WITH DIFFERENTIAL/PLATELET
BASOS PCT: 0 %
Basophils Absolute: 0 10*3/uL (ref 0–0.1)
EOS ABS: 0 10*3/uL (ref 0–0.7)
Eosinophils Relative: 0 %
HCT: 29.2 % — ABNORMAL LOW (ref 35.0–47.0)
HEMOGLOBIN: 9.3 g/dL — AB (ref 12.0–16.0)
Lymphocytes Relative: 11 %
Lymphs Abs: 0.6 10*3/uL — ABNORMAL LOW (ref 1.0–3.6)
MCH: 28.5 pg (ref 26.0–34.0)
MCHC: 32 g/dL (ref 32.0–36.0)
MCV: 89.1 fL (ref 80.0–100.0)
Monocytes Absolute: 0.5 10*3/uL (ref 0.2–0.9)
Monocytes Relative: 9 %
NEUTROS PCT: 80 %
Neutro Abs: 4.7 10*3/uL (ref 1.4–6.5)
PLATELETS: 287 10*3/uL (ref 150–440)
RBC: 3.27 MIL/uL — AB (ref 3.80–5.20)
RDW: 17.6 % — ABNORMAL HIGH (ref 11.5–14.5)
WBC: 5.9 10*3/uL (ref 3.6–11.0)

## 2015-06-09 LAB — IRON AND TIBC
IRON: 45 ug/dL (ref 28–170)
SATURATION RATIOS: 14 % (ref 10.4–31.8)
TIBC: 319 ug/dL (ref 250–450)
UIBC: 274 ug/dL

## 2015-06-09 LAB — FERRITIN: Ferritin: 63 ng/mL (ref 11–307)

## 2015-06-09 NOTE — Progress Notes (Signed)
Radiation Oncology Follow up Note  Name: Teresa Lutz   Date:   06/09/2015 MRN:  [id number] DOB: 13-Apr-1935    This 80 y.o. female presents to the clinic today for follow-up breast cancer.  REFERRING PROVIDER: Casilda Carls, MD  HPI: Patient is a 80 year old female with multiple comorbidities including anemia secondary to renal insufficiency who is now out 2-1/2 years completed radiation therapy to her right breast status post wide local excision and radiation therapy. She seen today and is doing fair. Has a small area of ulceration in the medial aspect of her right breast although she states that she doesn't scratch it quite often as a nervous habit. She currently is on letrozole tolerating that well her mammograms have been fine. Her most recent mammogram was a BI-RADS 2 showing benign findings  COMPLICATIONS OF TREATMENT: none  FOLLOW UP COMPLIANCE: keeps appointments   PHYSICAL EXAM:  BP 127/83 mmHg  Pulse 90  Temp(Src) 99.2 F (37.3 C)  Resp 18  Wt 198 lb 10.2 oz (90.1 kg) Well-developed obese wheelchair-bound female in NAD. Small area of excoriation in the medial aspect of the right breast no evidence of mass or nodularity subcutaneous that is noted. No other dominant mass or nodularity in either breast is noted no axillary or supraclavicular adenopathy is identified. Well-developed well-nourished patient in NAD. HEENT reveals PERLA, EOMI, discs not visualized.  Oral cavity is clear. No oral mucosal lesions are identified. Neck is clear without evidence of cervical or supraclavicular adenopathy. Lungs are clear to A&P. Cardiac examination is essentially unremarkable with regular rate and rhythm without murmur rub or thrill. Abdomen is benign with no organomegaly or masses noted. Motor sensory and DTR levels are equal and symmetric in the upper and lower extremities. Cranial nerves II through XII are grossly intact. Proprioception is intact. No peripheral adenopathy or edema is  identified. No motor or sensory levels are noted. Crude visual fields are within normal range.  RADIOLOGY RESULTS: Mammogram is reviewed showing benign findings BI-RADS 2  PLAN: Present time she continues to do well with no evidence of disease I've asked her to stop scratching that area. She continues close follow-up care with surgeon. Based on her multiple medical comorbidities and difficult were transportation I am turning follow-up care over to surgeon. We'll be happy to reevaluate the patient at any time should further treatment be indicated.  I would like to take this opportunity for allowing me to participate in the care of your patient.Armstead Peaks., MD

## 2015-06-10 ENCOUNTER — Encounter (INDEPENDENT_AMBULATORY_CARE_PROVIDER_SITE_OTHER): Payer: Medicare HMO | Admitting: Ophthalmology

## 2015-06-10 DIAGNOSIS — H43813 Vitreous degeneration, bilateral: Secondary | ICD-10-CM

## 2015-06-10 DIAGNOSIS — H34812 Central retinal vein occlusion, left eye, with macular edema: Secondary | ICD-10-CM

## 2015-06-10 DIAGNOSIS — I10 Essential (primary) hypertension: Secondary | ICD-10-CM | POA: Diagnosis not present

## 2015-06-10 DIAGNOSIS — H35033 Hypertensive retinopathy, bilateral: Secondary | ICD-10-CM | POA: Diagnosis not present

## 2015-06-13 ENCOUNTER — Other Ambulatory Visit: Payer: Self-pay | Admitting: *Deleted

## 2015-06-13 MED ORDER — APIXABAN 5 MG PO TABS: 5.0000 mg | ORAL_TABLET | Freq: Two times a day (BID) | ORAL | Status: DC

## 2015-06-15 ENCOUNTER — Inpatient Hospital Stay: Payer: Medicare HMO

## 2015-06-16 ENCOUNTER — Ambulatory Visit: Payer: Medicare HMO | Admitting: Cardiovascular Disease

## 2015-07-15 ENCOUNTER — Other Ambulatory Visit: Payer: Self-pay | Admitting: *Deleted

## 2015-07-15 MED ORDER — METOPROLOL TARTRATE 100 MG PO TABS: 150.0000 mg | ORAL_TABLET | Freq: Two times a day (BID) | ORAL | Status: DC

## 2015-07-21 ENCOUNTER — Encounter (INDEPENDENT_AMBULATORY_CARE_PROVIDER_SITE_OTHER): Payer: Medicare HMO | Admitting: Ophthalmology

## 2015-07-21 DIAGNOSIS — H43813 Vitreous degeneration, bilateral: Secondary | ICD-10-CM

## 2015-07-21 DIAGNOSIS — H47212 Primary optic atrophy, left eye: Secondary | ICD-10-CM

## 2015-07-21 DIAGNOSIS — H34812 Central retinal vein occlusion, left eye, with macular edema: Secondary | ICD-10-CM | POA: Diagnosis not present

## 2015-07-21 DIAGNOSIS — I10 Essential (primary) hypertension: Secondary | ICD-10-CM | POA: Diagnosis not present

## 2015-07-21 DIAGNOSIS — H35033 Hypertensive retinopathy, bilateral: Secondary | ICD-10-CM | POA: Diagnosis not present

## 2015-08-01 ENCOUNTER — Ambulatory Visit (INDEPENDENT_AMBULATORY_CARE_PROVIDER_SITE_OTHER): Payer: Medicare HMO | Admitting: Internal Medicine

## 2015-08-01 ENCOUNTER — Ambulatory Visit
Admission: RE | Admit: 2015-08-01 | Discharge: 2015-08-01 | Disposition: A | Discharge status: Home / Self Care | Payer: Medicare HMO | Source: Ambulatory Visit | Attending: Internal Medicine | Admitting: Internal Medicine

## 2015-08-01 ENCOUNTER — Encounter: Payer: Self-pay | Admitting: Internal Medicine

## 2015-08-01 VITALS — BP 128/72 | HR 86 | Ht 67.0 in | Wt 194.0 lb

## 2015-08-01 DIAGNOSIS — I4891 Unspecified atrial fibrillation: Secondary | ICD-10-CM | POA: Diagnosis not present

## 2015-08-01 DIAGNOSIS — G4733 Obstructive sleep apnea (adult) (pediatric): Secondary | ICD-10-CM

## 2015-08-01 DIAGNOSIS — J9811 Atelectasis: Secondary | ICD-10-CM | POA: Diagnosis not present

## 2015-08-01 DIAGNOSIS — R0600 Dyspnea, unspecified: Secondary | ICD-10-CM | POA: Diagnosis not present

## 2015-08-01 NOTE — Patient Instructions (Signed)
We will attempt to get the patient had new VPAP device

## 2015-08-01 NOTE — Addendum Note (Signed)
Addended by: Oscar La R on: 08/01/2015 03:41 PM   Modules accepted: Orders

## 2015-08-01 NOTE — Addendum Note (Signed)
Addended by: Maryanna Shape A on: 08/01/2015 11:49 AM   Modules accepted: Orders

## 2015-08-01 NOTE — Progress Notes (Signed)
* [facility]     Assessment and Plan:  Dyspnea. -This is likely multifactorial from severe deconditioning, debility, volume overload, muscle weakness. -Appearance she does not appear to have any evidence of primary pulmonary disease -She does have an elevated right diaphragm which may be contributory.  Obstructive sleep apnea. -The patient has not been using VPAP as recommended, she said her machine is broken. -We will attempt to see if we can get her machine repaired or replaced.  Debility and deconditioning. -The patient has severe debility and deconditioning, she is able unable to stand up from a seated position. -She appeared to be very weak. I did discuss with her and her daughter that I'm not sure that she can live at home much longer, and I think she is at high risk of having falls.  -I also did this attempt to have a discussion regarding end-of-life, but didn't. She does not appear to be ready to have this discussion at this time. I also did attempt to have discussions regarding change of living situation as I do not think she is capable living by herself anymore and is at high risk of having an accident, she did not wish to discuss that at this time.  Chronic venous stasis and volume overload. -This is likely contributing to her dyspnea. This is likely due to chronic diastolic CHF as well as chronic kidney disease.  Atrial fibrillation. -Currently on Eliquis  Chronic kidney disease.    Date: 08/01/2015  MRN# [id number] Teresa Lutz [birth year]   Cecilya Virga is a 81 y.o. old female seen in follow up for chief complaint of  Chief Complaint  Patient presents with  . Follow-up    cxr today. pt. says bi-pap isn't working it boils water out, pt. states breathing is baseline. occ. SOB. occ. dry cough. occ. wheezing. denies chest pain/tightness.     HPI:   The patient is a 81 year old female with a history of atrial fibrillation, type 2 diabetes  mellitus, hypertension, chronic kidney disease, sleep apnea, chronic leg edema, atrial fibrillation. At last visit she was noted that she had a history of sleep apnea, on VPAP. However, it was noted that she was not using the mask sufficiently, she is also noted to use 1 L of oxygen at night with Pap. She was noted to have chronic venous stasis, which is likely contributing to her dyspnea.  She was advised to use her VPAP every night for the entire night. She was also noted that she was very debilitated, she was therefore referred for physical therapy. She is subsequently undergone a SPECT scan which showed an EF of 28%.   She has completed home physical therapy, she felt that this was helpful. Her history is also discussed with her daughter who is her only child and is also present today. I did attempt to stand her up fro m the chair.   Most recent echocardiogram in 08/19/2014 showed an ejection fraction of 50-55%, mild mitral regurgitation, mildly dilated right and left atrium, moderate tricuspid regurgitation and pulmonary artery systolic pressure was 49 mmHg.  Chest x-ray images from 08/01/15, reviewed: This shows an elevated right-sided diaphragm, with strandy right sided atelectasis. Otherwise unremarkable.  Nuclear SPECT scan 04/01/2015: EF of 28%.  Medication:   Outpatient Encounter Prescriptions as of 08/01/2015  Medication Sig  . apixaban (ELIQUIS) 5 MG TABS tablet Take 1 tablet (5 mg total) by mouth 2 (two) times daily.  [name] BENICAR 40 MG tablet Take 40 mg  by mouth daily.   [name] BESIVANCE 0.6 % SUSP   . diltiazem (CARDIZEM CD) 120 MG 24 hr capsule Take 1 capsule (120 mg total) by mouth daily.  . ferrous sulfate 325 (65 FE) MG tablet Take 325 mg by mouth daily with breakfast.  . letrozole (FEMARA) 2.5 MG tablet Take 2.5 mg by mouth daily.   [name] levothyroxine (SYNTHROID, LEVOTHROID) 25 MCG tablet Take 25 mcg by mouth daily.   . metolazone (ZAROXOLYN) 5 MG tablet Take 5 mg by mouth daily as  needed.   . metoprolol (LOPRESSOR) 100 MG tablet Take 1.5 tablets (150 mg total) by mouth 2 (two) times daily.  . potassium chloride SA (K-DUR,KLOR-CON) 20 MEQ tablet Take 1 tablet (20 mEq total) by mouth daily.  [name] torsemide (DEMADEX) 20 MG tablet Take 2 tablets (40 mg total) by mouth daily.   No facility-administered encounter medications on file as of 08/01/2015.     Allergies:  Celebrex and Other  Review of Systems: Gen:  Denies  fever, sweats. HEENT: Denies blurred vision. Cvc:  No dizziness, chest pain or heaviness Resp:   Denies cough or sputum porduction. Gi: Denies swallowing difficulty, stomach pain. constipation, bowel incontinence Gu:  Denies bladder incontinence, burning urine Ext:   No Joint pain, stiffness. Skin: No skin rash, easy bruising. Endoc:  No polyuria, polydipsia. Psych: No depression, insomnia. Other:  All other systems were reviewed and found to be negative other than what is mentioned in the HPI.   Physical Examination:   VS: There were no vitals taken for this visit.  General Appearance: No distress  Neuro:without focal findings,  speech normal,  HEENT: PERRLA, EOM intact. Pulmonary: normal breath sounds, No wheezing.   CardiovascularNormal S1,S2.  No m/r/g.   Abdomen: Benign, Soft, non-tender. Renal:  No costovertebral tenderness  GU:  Not performed at this time. Endoc: No evident thyromegaly, no signs of acromegaly. Skin:   warm, no rash. Extremities: normal, no cyanosis, clubbing.   LABORATORY PANEL:   CBC No results for input(s): WBC, HGB, HCT, PLT in the last 168 hours. ------------------------------------------------------------------------------------------------------------------  Chemistries  No results for input(s): NA, K, CL, CO2, GLUCOSE, BUN, CREATININE, CALCIUM, MG, AST, ALT, ALKPHOS, BILITOT in the last 168 hours.  Invalid input(s):  GFRCGP ------------------------------------------------------------------------------------------------------------------  Cardiac Enzymes No results for input(s): TROPONINI in the last 168 hours. ------------------------------------------------------------  RADIOLOGY:   No results found for this or any previous visit. Results for orders placed during the hospital encounter of 01/23/08  DG Chest 2 View   Narrative Clinical Data: Preop workup for surgery.  Hypertension.   CHEST - 2 VIEW   Comparison: None   Findings: The heart size and mediastinal contours are within normal limits.  Both lungs are clear.  Mild degenerative spondylosis of the thoracic spine.   IMPRESSION: No active disease.  Provider: Mila Palmer   ------------------------------------------------------------------------------------------------------------------  Thank  you for allowing [facility] to assist in the care of your patient. Our recommendations are noted above.  Please contact us if we can be of further service.   Marda Stalker, MD.  [facility] Office Number: [phone]  Patricia Pesa, M.D.  Vilinda Boehringer, M.D.  Merton Border, M.D

## 2015-08-01 NOTE — Addendum Note (Signed)
Addended by: Maryanna Shape A on: 08/01/2015 12:10 PM   Modules accepted: Orders

## 2015-08-16 ENCOUNTER — Encounter: Payer: Self-pay | Admitting: Cardiovascular Disease

## 2015-08-16 ENCOUNTER — Ambulatory Visit (INDEPENDENT_AMBULATORY_CARE_PROVIDER_SITE_OTHER): Payer: Medicare HMO | Admitting: Cardiovascular Disease

## 2015-08-16 VITALS — BP 152/80 | HR 83 | Ht 67.0 in | Wt 216.8 lb

## 2015-08-16 DIAGNOSIS — I5032 Chronic diastolic (congestive) heart failure: Secondary | ICD-10-CM | POA: Diagnosis not present

## 2015-08-16 DIAGNOSIS — I482 Chronic atrial fibrillation, unspecified: Secondary | ICD-10-CM

## 2015-08-16 DIAGNOSIS — I10 Essential (primary) hypertension: Secondary | ICD-10-CM | POA: Diagnosis not present

## 2015-08-16 NOTE — Progress Notes (Signed)
Primary care physician: Dr. Rosario Jacks Nephrologist:  Dr. Rolly Salter  HPI  This is a 81 year old African American female who is here today for followup visit regarding chronic atrial fibrillation and chronic diastolic heart failure. She had cardiac catheterization done in 2012 which showed mild nonobstructive coronary artery disease. She has chronic medical conditions that include type 2 diabetes, hypertension, hyperlipidemia, chronic kidney disease, anemia of chronic disease, sleep apnea on CPAP, hypothyroidism, chronic leg edema and history of breast cancer.  Most recent echocardiogram in November 2016 showed normal LV systolic function, mildly dilated left atrium and mildly elevated pulmonary pressure at 41 mmHg. Previous volume overload improved significantly with torsemide. We were actually able to get her weight down to 192 pounds in November. However, Ms.Dedmon unfortunately has poor compliance with low sodium diet and her medications. She ran out of torsemide more than a week ago and her weight today is 216 pounds. She just picked her medications from pharmacy today but has not resumed the diuretic yet. Her creatinine is around 3.6. She is scheduled for AV graft in March.    Allergies  Allergen Reactions  . Celebrex [Celecoxib] Hives  . Other Hives    Arthritis meds     Current Outpatient Prescriptions on File Prior to Visit  Medication Sig Dispense Refill  . apixaban (ELIQUIS) 5 MG TABS tablet Take 1 tablet (5 mg total) by mouth 2 (two) times daily. 60 tablet 3  . BENICAR 40 MG tablet Take 40 mg by mouth daily.     [name] BESIVANCE 0.6 % SUSP     . diltiazem (CARDIZEM CD) 120 MG 24 hr capsule Take 1 capsule (120 mg total) by mouth daily. 30 capsule 6  . ferrous sulfate 325 (65 FE) MG tablet Take 325 mg by mouth daily with breakfast.    . letrozole (FEMARA) 2.5 MG tablet Take 2.5 mg by mouth daily.     [name] levothyroxine (SYNTHROID, LEVOTHROID) 25 MCG tablet Take 25 mcg by mouth daily.     .  metolazone (ZAROXOLYN) 5 MG tablet Take 5 mg by mouth daily as needed.     . metoprolol (LOPRESSOR) 100 MG tablet Take 1.5 tablets (150 mg total) by mouth 2 (two) times daily. 90 tablet 3  . potassium chloride SA (K-DUR,KLOR-CON) 20 MEQ tablet Take 1 tablet (20 mEq total) by mouth daily. 60 tablet 3  . torsemide (DEMADEX) 20 MG tablet Take 2 tablets (40 mg total) by mouth daily. 120 tablet 3   No current facility-administered medications on file prior to visit.     Past Medical History  Diagnosis Date  . Arthritis 1990  . Breast cancer ([place]) 2012    Right stage 1  . Ulcer 1980  . Lump or mass in breast     right  . Malignant neoplasm of upper-inner quadrant of female breast ([place])     right  . Hemorrhoid   . Unspecified vitamin D deficiency   . Anemia of chronic disease   . Hypopotassemia   . Unspecified hypothyroidism   . Goiter, specified as simple   . Hyperlipidemia   . CKD (chronic kidney disease), stage IV ([place])     Most recent creatinine was 1.82  . Hypertension 1960  . Chronic atrial fibrillation ([place]) 2014    a. Eliquis 5 mg bid; CHADSVASc 5 (CHF, HTN, age x 2, female)  . Lower extremity edema   . Chronic diastolic CHF (congestive heart failure) ([place])   . Pulmonary HTN ([place])  Past Surgical History  Procedure Laterality Date  . Mammosite balloon placement  Right 11/2010  . Colonoscopy  2011  . Breast lumpectomy Right 50 years ago  . Breast lumpectomy Right 2012    invasive   . Cardiac catheterization  06/2010    [facility]: Mild nonobstructive coronary artery disease with normal ejection fraction.  . Cardioversion       Family History  Problem Relation Age of Onset  . Cancer Mother     ovarian  . Hypertension Mother   . Cancer Brother     colon  . Stroke Father      Social History   Social History  . Marital Status: Single    Spouse Name: N/A  . Number of Children: N/A  . Years of Education: N/A   Occupational History  . Not on file.   Social  History Main Topics  . Smoking status: Never Smoker   . Smokeless tobacco: Never Used  . Alcohol Use: No  . Drug Use: No  . Sexual Activity: Not on file   Other Topics Concern  . Not on file   Social History Narrative     ROS A 10 point review of system was performed. It is negative other than that mentioned in the history of present illness.   PHYSICAL EXAM   BP 152/80 mmHg  Pulse 83  Ht 5\' 7"  (1.702 m)  Wt 216 lb 12 oz (98.317 kg)  BMI 33.94 kg/m2 Constitutional: She is oriented to person, place, and time. She appears well-developed and well-nourished. No distress.  HENT: No nasal discharge.  Head: Normocephalic and atraumatic.  Eyes: Pupils are equal and round. No discharge.  Neck: Normal range of motion. Neck supple.No JVD present. No thyromegaly present.  Cardiovascular: Iregular rhythm,normal rate,  normal heart sounds. Exam reveals no gallop and no friction rub. No murmur heard.  Pulmonary/Chest: Effort normal and breath sounds normal. No stridor. No respiratory distress. She has no wheezes. She has no rales. She exhibits no tenderness.  Abdominal: Soft. Bowel sounds are normal. She exhibits no distension. There is no tenderness. There is no rebound and no guarding.  Musculoskeletal: Normal range of motion. She exhibits trace  edema and no tenderness.  Neurological: She is alert and oriented to person, place, and time. Coordination normal.  Skin: Skin is warm and dry. No rash noted. She is not diaphoretic. No erythema. No pallor.  Psychiatric: She has a normal mood and affect. Her behavior is normal. Judgment and thought content normal.    EKG: Atrial fibrillation with controlled ventricular rate. Nonspecific T wave changes.     ASSESSMENT AND PLAN

## 2015-08-16 NOTE — Patient Instructions (Signed)
Medication Instructions: Resume taking Torsemide 40 mg bid.   Labwork: None.   Procedures/Testing: None.   Follow-Up: 4 months with Dr. Fletcher Anon  Any Additional Special Instructions Will Be Listed Below (If Applicable).   Stop taking Eliquis 3 days before your procedure/surgery in March.   If you need a refill on your cardiac medications before your next appointment, please call your pharmacy.

## 2015-08-17 ENCOUNTER — Encounter (INDEPENDENT_AMBULATORY_CARE_PROVIDER_SITE_OTHER): Payer: Medicare HMO | Admitting: Ophthalmology

## 2015-08-17 DIAGNOSIS — H43813 Vitreous degeneration, bilateral: Secondary | ICD-10-CM

## 2015-08-17 DIAGNOSIS — H34812 Central retinal vein occlusion, left eye, with macular edema: Secondary | ICD-10-CM

## 2015-08-17 DIAGNOSIS — H35033 Hypertensive retinopathy, bilateral: Secondary | ICD-10-CM | POA: Diagnosis not present

## 2015-08-17 DIAGNOSIS — I10 Essential (primary) hypertension: Secondary | ICD-10-CM

## 2015-08-17 DIAGNOSIS — H2703 Aphakia, bilateral: Secondary | ICD-10-CM

## 2015-08-17 DIAGNOSIS — H47212 Primary optic atrophy, left eye: Secondary | ICD-10-CM

## 2015-08-18 ENCOUNTER — Other Ambulatory Visit: Payer: Self-pay | Admitting: Cardiovascular Disease

## 2015-08-21 NOTE — Assessment & Plan Note (Signed)
The patient is severely volume overloaded due to poor compliance with low sodium diet and diuretic therapy. I had a prolonged discussion with the patient and her daughter about the importance of these 2 issues and we provided her with information again. I asked her to resume taking torsemide as it was scheduled. She is also supposed to take metolazone as needed based on her weight she hasn't been doing so. With her creatinine being close to 4, I suspect that she is going to be on dialysis in the near future.

## 2015-08-21 NOTE — Assessment & Plan Note (Signed)
Ventricular rate is well controlled on current dose of metoprolol and diltiazem.

## 2015-08-21 NOTE — Assessment & Plan Note (Signed)
Blood pressure is elevated but this is due to volume overload. Blood pressure should improve with resuming diuresis.

## 2015-08-22 ENCOUNTER — Encounter: Payer: Self-pay | Admitting: Intensive Care

## 2015-08-22 ENCOUNTER — Other Ambulatory Visit: Payer: Self-pay

## 2015-08-22 ENCOUNTER — Emergency Department: Payer: Medicare HMO

## 2015-08-22 ENCOUNTER — Inpatient Hospital Stay
Admission: EM | Admit: 2015-08-22 | Discharge: 2015-09-08 | DRG: 264 | Disposition: A | Discharge status: Skilled Nursing Facility | Payer: Medicare HMO | Attending: Internal Medicine | Admitting: Internal Medicine

## 2015-08-22 DIAGNOSIS — D638 Anemia in other chronic diseases classified elsewhere: Secondary | ICD-10-CM | POA: Diagnosis present

## 2015-08-22 DIAGNOSIS — E559 Vitamin D deficiency, unspecified: Secondary | ICD-10-CM | POA: Diagnosis present

## 2015-08-22 DIAGNOSIS — E1122 Type 2 diabetes mellitus with diabetic chronic kidney disease: Secondary | ICD-10-CM | POA: Diagnosis present

## 2015-08-22 DIAGNOSIS — I4891 Unspecified atrial fibrillation: Secondary | ICD-10-CM | POA: Diagnosis not present

## 2015-08-22 DIAGNOSIS — I481 Persistent atrial fibrillation: Secondary | ICD-10-CM | POA: Diagnosis not present

## 2015-08-22 DIAGNOSIS — R601 Generalized edema: Secondary | ICD-10-CM

## 2015-08-22 DIAGNOSIS — K5730 Diverticulosis of large intestine without perforation or abscess without bleeding: Secondary | ICD-10-CM | POA: Diagnosis not present

## 2015-08-22 DIAGNOSIS — I482 Chronic atrial fibrillation, unspecified: Secondary | ICD-10-CM | POA: Diagnosis present

## 2015-08-22 DIAGNOSIS — B961 Klebsiella pneumoniae [K. pneumoniae] as the cause of diseases classified elsewhere: Secondary | ICD-10-CM | POA: Diagnosis present

## 2015-08-22 DIAGNOSIS — N186 End stage renal disease: Secondary | ICD-10-CM

## 2015-08-22 DIAGNOSIS — I132 Hypertensive heart and chronic kidney disease with heart failure and with stage 5 chronic kidney disease, or end stage renal disease: Secondary | ICD-10-CM | POA: Diagnosis present

## 2015-08-22 DIAGNOSIS — E876 Hypokalemia: Secondary | ICD-10-CM | POA: Diagnosis not present

## 2015-08-22 DIAGNOSIS — Z823 Family history of stroke: Secondary | ICD-10-CM | POA: Diagnosis not present

## 2015-08-22 DIAGNOSIS — K5720 Diverticulitis of large intestine with perforation and abscess without bleeding: Secondary | ICD-10-CM

## 2015-08-22 DIAGNOSIS — Z992 Dependence on renal dialysis: Secondary | ICD-10-CM

## 2015-08-22 DIAGNOSIS — N390 Urinary tract infection, site not specified: Secondary | ICD-10-CM | POA: Diagnosis present

## 2015-08-22 DIAGNOSIS — G9349 Other encephalopathy: Secondary | ICD-10-CM | POA: Diagnosis not present

## 2015-08-22 DIAGNOSIS — E669 Obesity, unspecified: Secondary | ICD-10-CM | POA: Diagnosis present

## 2015-08-22 DIAGNOSIS — K5900 Constipation, unspecified: Secondary | ICD-10-CM | POA: Diagnosis present

## 2015-08-22 DIAGNOSIS — E46 Unspecified protein-calorie malnutrition: Secondary | ICD-10-CM | POA: Diagnosis present

## 2015-08-22 DIAGNOSIS — K668 Other specified disorders of peritoneum: Secondary | ICD-10-CM | POA: Diagnosis not present

## 2015-08-22 DIAGNOSIS — G4733 Obstructive sleep apnea (adult) (pediatric): Secondary | ICD-10-CM | POA: Diagnosis present

## 2015-08-22 DIAGNOSIS — Z7901 Long term (current) use of anticoagulants: Secondary | ICD-10-CM | POA: Diagnosis not present

## 2015-08-22 DIAGNOSIS — E785 Hyperlipidemia, unspecified: Secondary | ICD-10-CM | POA: Diagnosis present

## 2015-08-22 DIAGNOSIS — R296 Repeated falls: Secondary | ICD-10-CM | POA: Diagnosis present

## 2015-08-22 DIAGNOSIS — I272 Other secondary pulmonary hypertension: Secondary | ICD-10-CM | POA: Diagnosis present

## 2015-08-22 DIAGNOSIS — R7989 Other specified abnormal findings of blood chemistry: Secondary | ICD-10-CM | POA: Diagnosis not present

## 2015-08-22 DIAGNOSIS — L8990 Pressure ulcer of unspecified site, unspecified stage: Secondary | ICD-10-CM

## 2015-08-22 DIAGNOSIS — Z79899 Other long term (current) drug therapy: Secondary | ICD-10-CM | POA: Diagnosis not present

## 2015-08-22 DIAGNOSIS — Z6831 Body mass index (BMI) 31.0-31.9, adult: Secondary | ICD-10-CM | POA: Diagnosis not present

## 2015-08-22 DIAGNOSIS — D631 Anemia in chronic kidney disease: Secondary | ICD-10-CM | POA: Diagnosis present

## 2015-08-22 DIAGNOSIS — I248 Other forms of acute ischemic heart disease: Secondary | ICD-10-CM | POA: Diagnosis present

## 2015-08-22 DIAGNOSIS — I959 Hypotension, unspecified: Secondary | ICD-10-CM | POA: Diagnosis not present

## 2015-08-22 DIAGNOSIS — E040 Nontoxic diffuse goiter: Secondary | ICD-10-CM | POA: Diagnosis present

## 2015-08-22 DIAGNOSIS — Z8249 Family history of ischemic heart disease and other diseases of the circulatory system: Secondary | ICD-10-CM

## 2015-08-22 DIAGNOSIS — E039 Hypothyroidism, unspecified: Secondary | ICD-10-CM | POA: Diagnosis present

## 2015-08-22 DIAGNOSIS — Z853 Personal history of malignant neoplasm of breast: Secondary | ICD-10-CM

## 2015-08-22 DIAGNOSIS — N184 Chronic kidney disease, stage 4 (severe): Secondary | ICD-10-CM | POA: Diagnosis not present

## 2015-08-22 DIAGNOSIS — N179 Acute kidney failure, unspecified: Secondary | ICD-10-CM | POA: Diagnosis present

## 2015-08-22 DIAGNOSIS — L89629 Pressure ulcer of left heel, unspecified stage: Secondary | ICD-10-CM | POA: Diagnosis present

## 2015-08-22 DIAGNOSIS — I119 Hypertensive heart disease without heart failure: Secondary | ICD-10-CM

## 2015-08-22 DIAGNOSIS — I5033 Acute on chronic diastolic (congestive) heart failure: Secondary | ICD-10-CM | POA: Diagnosis present

## 2015-08-22 DIAGNOSIS — E8809 Other disorders of plasma-protein metabolism, not elsewhere classified: Secondary | ICD-10-CM

## 2015-08-22 DIAGNOSIS — R778 Other specified abnormalities of plasma proteins: Secondary | ICD-10-CM

## 2015-08-22 DIAGNOSIS — I5032 Chronic diastolic (congestive) heart failure: Secondary | ICD-10-CM

## 2015-08-22 DIAGNOSIS — I2510 Atherosclerotic heart disease of native coronary artery without angina pectoris: Secondary | ICD-10-CM | POA: Diagnosis present

## 2015-08-22 DIAGNOSIS — N2581 Secondary hyperparathyroidism of renal origin: Secondary | ICD-10-CM | POA: Diagnosis present

## 2015-08-22 LAB — CBC WITH DIFFERENTIAL/PLATELET
Basophils Absolute: 0 10*3/uL (ref 0–0.1)
Basophils Relative: 0 %
Eosinophils Absolute: 0 10*3/uL (ref 0–0.7)
Eosinophils Relative: 0 %
HEMATOCRIT: 27.7 % — AB (ref 35.0–47.0)
Hemoglobin: 8.9 g/dL — ABNORMAL LOW (ref 12.0–16.0)
LYMPHS PCT: 6 %
Lymphs Abs: 0.7 10*3/uL — ABNORMAL LOW (ref 1.0–3.6)
MCH: 28.5 pg (ref 26.0–34.0)
MCHC: 32.1 g/dL (ref 32.0–36.0)
MCV: 88.8 fL (ref 80.0–100.0)
MONO ABS: 0.7 10*3/uL (ref 0.2–0.9)
MONOS PCT: 6 %
NEUTROS ABS: 10 10*3/uL — AB (ref 1.4–6.5)
Neutrophils Relative %: 88 %
Platelets: 327 10*3/uL (ref 150–440)
RBC: 3.12 MIL/uL — ABNORMAL LOW (ref 3.80–5.20)
RDW: 16.5 % — AB (ref 11.5–14.5)
WBC: 11.3 10*3/uL — ABNORMAL HIGH (ref 3.6–11.0)

## 2015-08-22 LAB — COMPREHENSIVE METABOLIC PANEL
ALK PHOS: 65 U/L (ref 38–126)
ALT: 18 U/L (ref 14–54)
AST: 14 U/L — ABNORMAL LOW (ref 15–41)
Albumin: 2.5 g/dL — ABNORMAL LOW (ref 3.5–5.0)
Anion gap: 15 (ref 5–15)
BUN: 85 mg/dL — ABNORMAL HIGH (ref 6–20)
CALCIUM: 9.9 mg/dL (ref 8.9–10.3)
CO2: 24 mmol/L (ref 22–32)
CREATININE: 4.19 mg/dL — AB (ref 0.44–1.00)
Chloride: 104 mmol/L (ref 101–111)
GFR, EST AFRICAN AMERICAN: 11 mL/min — AB (ref >60)
GFR, EST NON AFRICAN AMERICAN: 9 mL/min — AB (ref >60)
Glucose, Bld: 135 mg/dL — ABNORMAL HIGH (ref 65–99)
Potassium: 3.7 mmol/L (ref 3.5–5.1)
Sodium: 143 mmol/L (ref 135–145)
Total Bilirubin: 0.5 mg/dL (ref 0.3–1.2)
Total Protein: 6.6 g/dL (ref 6.5–8.1)

## 2015-08-22 LAB — BRAIN NATRIURETIC PEPTIDE: B Natriuretic Peptide: 809 pg/mL — ABNORMAL HIGH (ref 0.0–100.0)

## 2015-08-22 LAB — GLUCOSE, CAPILLARY
GLUCOSE-CAPILLARY: 97 mg/dL (ref 65–99)
GLUCOSE-CAPILLARY: 87 mg/dL (ref 65–99)

## 2015-08-22 LAB — PHOSPHORUS: Phosphorus: 5.6 mg/dL — ABNORMAL HIGH (ref 2.5–4.6)

## 2015-08-22 LAB — MRSA PCR SCREENING: MRSA BY PCR: NEGATIVE

## 2015-08-22 LAB — MAGNESIUM: MAGNESIUM: 2.3 mg/dL (ref 1.7–2.4)

## 2015-08-22 LAB — TROPONIN I: Troponin I: 0.08 ng/mL — ABNORMAL HIGH (ref <0.031)

## 2015-08-22 MED ORDER — DEXTROSE 5 % IV SOLN
5.0000 mg/h | Freq: Once | INTRAVENOUS | Status: AC
  Administered 2015-08-22: 5 mg/h via INTRAVENOUS
  Filled 2015-08-22: qty 100

## 2015-08-22 MED ORDER — DILTIAZEM HCL 25 MG/5ML IV SOLN
10.0000 mg | Freq: Once | INTRAVENOUS | Status: AC
  Administered 2015-08-22: 10 mg via INTRAVENOUS
  Filled 2015-08-22: qty 5

## 2015-08-22 MED ORDER — CALCITRIOL 0.25 MCG PO CAPS
0.2500 ug | ORAL_CAPSULE | ORAL | Status: DC
Start: 2015-08-22 — End: 2015-08-28
  Administered 2015-08-24 – 2015-08-26 (×2): 0.25 ug via ORAL
  Filled 2015-08-22 (×3): qty 1

## 2015-08-22 MED ORDER — METOPROLOL TARTRATE 100 MG PO TABS
150.0000 mg | ORAL_TABLET | Freq: Two times a day (BID) | ORAL | Status: DC
  Administered 2015-08-22 – 2015-09-04 (×25): 150 mg via ORAL
  Filled 2015-08-22 (×9): qty 1
  Filled 2015-08-22: qty 3
  Filled 2015-08-22 (×10): qty 1
  Filled 2015-08-22: qty 3
  Filled 2015-08-22 (×6): qty 1

## 2015-08-22 MED ORDER — METOLAZONE 2.5 MG PO TABS
5.0000 mg | ORAL_TABLET | Freq: Every day | ORAL | Status: DC
  Administered 2015-08-22: 5 mg via ORAL
  Filled 2015-08-22: qty 2

## 2015-08-22 MED ORDER — LETROZOLE 2.5 MG PO TABS
2.5000 mg | ORAL_TABLET | Freq: Every day | ORAL | Status: DC
  Administered 2015-08-22 – 2015-09-08 (×17): 2.5 mg via ORAL
  Filled 2015-08-22 (×20): qty 1

## 2015-08-22 MED ORDER — LEVOTHYROXINE SODIUM 25 MCG PO TABS
25.0000 ug | ORAL_TABLET | Freq: Every day | ORAL | Status: DC
  Administered 2015-08-23 – 2015-09-08 (×17): 25 ug via ORAL
  Filled 2015-08-22 (×17): qty 1

## 2015-08-22 MED ORDER — DILTIAZEM HCL ER COATED BEADS 120 MG PO CP24
120.0000 mg | ORAL_CAPSULE | Freq: Every day | ORAL | Status: DC
  Administered 2015-08-23 – 2015-09-04 (×13): 120 mg via ORAL
  Filled 2015-08-22 (×14): qty 1

## 2015-08-22 MED ORDER — ONDANSETRON HCL 4 MG/2ML IJ SOLN: 4.0000 mg | Freq: Four times a day (QID) | INTRAMUSCULAR | Status: DC | PRN

## 2015-08-22 MED ORDER — ALBUTEROL SULFATE (2.5 MG/3ML) 0.083% IN NEBU: 2.5000 mg | INHALATION_SOLUTION | RESPIRATORY_TRACT | Status: DC | PRN

## 2015-08-22 MED ORDER — SODIUM CHLORIDE 0.9% FLUSH
3.0000 mL | Freq: Two times a day (BID) | INTRAVENOUS | Status: DC
  Administered 2015-08-22 – 2015-09-08 (×25): 3 mL via INTRAVENOUS

## 2015-08-22 MED ORDER — ONDANSETRON HCL 4 MG PO TABS: 4.0000 mg | ORAL_TABLET | Freq: Four times a day (QID) | ORAL | Status: DC | PRN

## 2015-08-22 MED ORDER — APIXABAN 5 MG PO TABS
5.0000 mg | ORAL_TABLET | Freq: Two times a day (BID) | ORAL | Status: DC
  Administered 2015-08-22: 5 mg via ORAL
  Filled 2015-08-22: qty 1

## 2015-08-22 MED ORDER — SODIUM CHLORIDE 0.9 % IV SOLN
250.0000 mL | INTRAVENOUS | Status: DC | PRN
  Administered 2015-08-23: 250 mL via INTRAVENOUS

## 2015-08-22 MED ORDER — FERROUS SULFATE 325 (65 FE) MG PO TABS
325.0000 mg | ORAL_TABLET | Freq: Every day | ORAL | Status: DC
  Administered 2015-08-23 – 2015-09-08 (×16): 325 mg via ORAL
  Filled 2015-08-22 (×17): qty 1

## 2015-08-22 MED ORDER — DILTIAZEM HCL 100 MG IV SOLR
5.0000 mg/h | INTRAVENOUS | Status: DC
  Administered 2015-08-22 – 2015-08-23 (×2): 10 mg/h via INTRAVENOUS
  Filled 2015-08-22: qty 100

## 2015-08-22 MED ORDER — SODIUM CHLORIDE 0.9% FLUSH: 3.0000 mL | INTRAVENOUS | Status: DC | PRN

## 2015-08-22 MED ORDER — SODIUM CHLORIDE 0.9% FLUSH
3.0000 mL | Freq: Two times a day (BID) | INTRAVENOUS | Status: DC
  Administered 2015-08-22 – 2015-08-28 (×11): 3 mL via INTRAVENOUS

## 2015-08-22 MED ORDER — TORSEMIDE 20 MG PO TABS
40.0000 mg | ORAL_TABLET | Freq: Two times a day (BID) | ORAL | Status: DC
  Administered 2015-08-22: 40 mg via ORAL
  Filled 2015-08-22 (×2): qty 2

## 2015-08-22 MED ORDER — ACETAMINOPHEN 650 MG RE SUPP: 650.0000 mg | Freq: Four times a day (QID) | RECTAL | Status: DC | PRN

## 2015-08-22 MED ORDER — ACETAMINOPHEN 325 MG PO TABS
650.0000 mg | ORAL_TABLET | Freq: Four times a day (QID) | ORAL | Status: DC | PRN
  Administered 2015-08-22: 650 mg via ORAL
  Filled 2015-08-22 (×2): qty 2

## 2015-08-22 NOTE — ED Notes (Signed)
Patient arrived by EMS from home. Patient states her legs/feet started swelling over the weekend. Patient reports "I take lasix and it usually gets rid of the swelling but not over the weekend" Patient also states she falls a lot and cannot get up from the floor until someone shows up to help her. PAtient denies chest pain or SOB

## 2015-08-22 NOTE — H&P (Addendum)
[address] [facility] [place] NAME: Teresa Lutz    MR#:  [id number]  DATE OF BIRTH:  1935-04-21  DATE OF ADMISSION:  08/22/2015  PRIMARY CARE PHYSICIAN: Casilda Carls, MD   REQUESTING/REFERRING PHYSICIAN: Earleen Newport, MD  CHIEF COMPLAINT:   Chief Complaint  Patient presents with  . Leg Swelling  . Tachycardia   worsening leg edema and body weight gain for the past 3 days.  HISTORY OF PRESENT ILLNESS:  Teresa Lutz  is a 81 y.o. female with a known history of CKD stage IV, A. Fib, chronic diastolic CHF and hypertension. The patient presented to the ED with above chief complaint. She has had a worsening leg edema for the past 3 days. She is taking Lasix without improvement. She denies any fever or chills, no chest pain or palpitation, no orthopnea or nocturnal dyspnea. ED physician discussed with on-call nephrologist Dr. Juleen China, who suggested that admitted patient for possible hemodialysis. Patient was found A. fib with RVR at 140 to 150s, given Cardizem 1 dose without improvement. So she is on Cardizem drip.  PAST MEDICAL HISTORY:   Past Medical History  Diagnosis Date  . Arthritis 1990  . Breast cancer ([place]) 2012    Right stage 1  . Ulcer 1980  . Lump or mass in breast     right  . Malignant neoplasm of upper-inner quadrant of female breast ([place])     right  . Hemorrhoid   . Unspecified vitamin D deficiency   . Anemia of chronic disease   . Hypopotassemia   . Unspecified hypothyroidism   . Goiter, specified as simple   . Hyperlipidemia   . CKD (chronic kidney disease), stage IV ([place])     Most recent creatinine was 1.82  . Hypertension 1960  . Chronic atrial fibrillation ([place]) 2014    a. Eliquis 5 mg bid; CHADSVASc 5 (CHF, HTN, age x 2, female)  . Lower extremity edema   . Chronic diastolic CHF (congestive heart failure) ([place])   . Pulmonary HTN ([place])   . Atrial fibrillation ([place])     PAST SURGICAL HISTORY:    Past Surgical History  Procedure Laterality Date  . Mammosite balloon placement  Right 11/2010  . Colonoscopy  2011  . Breast lumpectomy Right 50 years ago  . Breast lumpectomy Right 2012    invasive   . Cardiac catheterization  06/2010    [facility]: Mild nonobstructive coronary artery disease with normal ejection fraction.  . Cardioversion      SOCIAL HISTORY:   Social History  Substance Use Topics  . Smoking status: Never Smoker   . Smokeless tobacco: Never Used  . Alcohol Use: No    FAMILY HISTORY:   Family History  Problem Relation Age of Onset  . Cancer Mother     ovarian  . Hypertension Mother   . Cancer Brother     colon  . Stroke Father     DRUG ALLERGIES:   Allergies  Allergen Reactions  . Celebrex [Celecoxib] Hives    REVIEW OF SYSTEMS:  CONSTITUTIONAL: No fever, but has generalized weakness.  EYES: No blurred or double vision.  EARS, NOSE, AND THROAT: No tinnitus or ear pain.  RESPIRATORY: No cough, shortness of breath, wheezing or hemoptysis.  CARDIOVASCULAR: No chest pain, orthopnea, but has a worsening leg edema.  GASTROINTESTINAL: No nausea, vomiting, diarrhea or abdominal pain.  GENITOURINARY: No dysuria, hematuria.  ENDOCRINE: No polyuria, nocturia,  HEMATOLOGY: No  anemia, easy bruising or bleeding SKIN: No rash or lesion. MUSCULOSKELETAL: No joint pain or arthritis.   NEUROLOGIC: No tingling, numbness, weakness.  PSYCHIATRY: No anxiety or depression.   MEDICATIONS AT HOME:   Prior to Admission medications   Medication Sig Start Date End Date Taking? Authorizing Provider  acetaminophen (TYLENOL) 325 MG tablet Take 325 mg by mouth every 6 (six) hours as needed for mild pain.   Yes Historical Provider, MD  apixaban (ELIQUIS) 5 MG TABS tablet Take 1 tablet (5 mg total) by mouth 2 (two) times daily. 06/13/15  Yes Wellington Hampshire, MD  BESIVANCE 0.6 % SUSP Place 1 drop into the left eye 4 (four) times daily. Pt only uses this medication for two  days once a month.   Yes Historical Provider, MD  calcitRIOL (ROCALTROL) 0.25 MCG capsule Take 0.25 mcg by mouth every Monday, Wednesday, and Friday.   Yes Historical Provider, MD  diltiazem (CARDIZEM CD) 120 MG 24 hr capsule Take 1 capsule (120 mg total) by mouth daily. 04/11/15  Yes Wellington Hampshire, MD  ferrous sulfate 325 (65 FE) MG tablet Take 325 mg by mouth daily with breakfast.   Yes Historical Provider, MD  letrozole (FEMARA) 2.5 MG tablet Take 2.5 mg by mouth daily.    Yes Historical Provider, MD  levothyroxine (SYNTHROID, LEVOTHROID) 25 MCG tablet Take 25 mcg by mouth daily before breakfast.    Yes Historical Provider, MD  metolazone (ZAROXOLYN) 5 MG tablet Take 5 mg by mouth daily.    Yes Historical Provider, MD  metoprolol (LOPRESSOR) 100 MG tablet Take 1.5 tablets (150 mg total) by mouth 2 (two) times daily. 07/15/15  Yes Wellington Hampshire, MD  olmesartan (BENICAR) 40 MG tablet Take 40 mg by mouth daily.   Yes Historical Provider, MD  potassium chloride SA (K-DUR,KLOR-CON) 20 MEQ tablet Take 20 mEq by mouth daily.   Yes Historical Provider, MD  torsemide (DEMADEX) 20 MG tablet Take 40 mg by mouth 2 (two) times daily.   Yes Historical Provider, MD  Vitamin D, Ergocalciferol, (DRISDOL) 50000 units CAPS capsule Take 50,000 Units by mouth every 7 (seven) days. Pt takes on Sunday.   Yes Historical Provider, MD      VITAL SIGNS:  Blood pressure 146/94, pulse 113, temperature 98.5 F (36.9 C), temperature source Oral, resp. rate 16, weight 97.886 kg (215 lb 12.8 oz), SpO2 100 %.  PHYSICAL EXAMINATION:  GENERAL:  81 y.o.-year-old patient lying in the bed with no acute distress. Obese. EYES: Pupils equal, round, reactive to light and accommodation. No scleral icterus. Extraocular muscles intact.  HEENT: Head atraumatic, normocephalic. Oropharynx and nasopharynx clear.  NECK:  Supple, no jugular venous distention. No thyroid enlargement, no tenderness.  LUNGS: Normal breath sounds  bilaterally, no wheezing, rales,rhonchi or crepitation. No use of accessory muscles of respiration.  CARDIOVASCULAR: Irregular heart rate and rhythm and tachycardia. No murmurs, rubs, or gallops.  ABDOMEN: Soft, nontender, nondistended. Bowel sounds present. No organomegaly or mass.  EXTREMITIES: Bilateral lower extremity edema 2+, no cyanosis, or clubbing.  NEUROLOGIC: Cranial nerves II through XII are intact. Muscle strength 3-4/5 in all extremities. Sensation intact. Gait not checked.  PSYCHIATRIC: The patient is alert and oriented x 3.  SKIN: No obvious rash, lesion, or ulcer.   LABORATORY PANEL:   CBC  Recent Labs Lab 08/22/15 1519  WBC 11.3*  HGB 8.9*  HCT 27.7*  PLT 327   ------------------------------------------------------------------------------------------------------------------  Chemistries   Recent Labs Lab 08/22/15 1519  NA  143  K 3.7  CL 104  CO2 24  GLUCOSE 135*  BUN 85*  CREATININE 4.19*  CALCIUM 9.9  AST 14*  ALT 18  ALKPHOS 65  BILITOT 0.5   ------------------------------------------------------------------------------------------------------------------  Cardiac Enzymes  Recent Labs Lab 08/22/15 1519  TROPONINI 0.08*   ------------------------------------------------------------------------------------------------------------------  RADIOLOGY:  Dg Chest Port 1 View  08/22/2015  CLINICAL DATA:  BILATERAL lower extremity swelling since this weekend, known CHF, dyspnea, breast cancer, chronic atrial fibrillation, pulmonary hypertension EXAM: PORTABLE CHEST 1 VIEW COMPARISON:  Portable exam 1533 hours compared to 08/01/2015 FINDINGS: Minimal enlargement of cardiac silhouette. Tortuosity of thoracic aorta. Pulmonary vascularity normal. Chronic RIGHT basilar atelectasis. Lungs otherwise clear. No pleural effusion or pneumothorax. Bones demineralized with degenerative disc disease changes thoracic spine and BILATERAL glenohumeral degenerative  changes. IMPRESSION: Minimal enlargement of cardiac silhouette with chronic RIGHT basilar atelectasis. Electronically Signed   By: Lavonia Dana M.D.   On: 08/22/2015 15:50    EKG:   Orders placed or performed during the hospital encounter of 08/22/15  . ED EKG  . ED EKG    IMPRESSION AND PLAN:   A. fib with RVR The patient will be admitted to stepdown unit. Continue Cardizem drip and eliquis. Cardiology consult.  CKD progress to ESRD. Continue diuretics and follow-up nephrology consult for possible hemodialysis.  Elevated troponin. Possible due to worsening renal failure. Follow-up troponin and continue eliquis.  Hypertension. Continue hypertension medication.  Anemia of chronic disease. Stable.  All the records are reviewed and case discussed with ED provider. Management plans discussed with the patient, her daughter and they are in agreement.  CODE STATUS: Full code  TOTAL critical TIME TAKING CARE OF THIS PATIENT: 57 minutes.    Demetrios Loll M.D on 08/22/2015 at 7:23 PM  Between 7am to 6pm - Pager - [phone]  After 6pm go to [email or web address] - password [email or web address]  Office  [phone]  CC: Primary care physician; Casilda Carls, MD

## 2015-08-22 NOTE — ED Provider Notes (Signed)
[facility] Emergency Department Provider Note     Time seen: ----------------------------------------- 3:36 PM on 08/22/2015 -----------------------------------------    I have reviewed the triage vital signs and the nursing notes.   HISTORY  Chief Complaint Leg Swelling and Tachycardia    HPI Teresa Lutz is a 81 y.o. female who presents to ER for increased swelling in her legs over the weekend. Patient states she takes Lasix normally does get through the swelling but it hasn't for the past several days. Patient states she falls a lot and cannot get up from the floor until someone helps her out. She denies fevers or chills, denies chest pain or palpitations. She denies shortness of breath. She's not sure how much fluid or weight she's gained. Patient was told the past she may need dialysis soon.   Past Medical History  Diagnosis Date  . Arthritis 1990  . Breast cancer ([place]) 2012    Right stage 1  . Ulcer 1980  . Lump or mass in breast     right  . Malignant neoplasm of upper-inner quadrant of female breast ([place])     right  . Hemorrhoid   . Unspecified vitamin D deficiency   . Anemia of chronic disease   . Hypopotassemia   . Unspecified hypothyroidism   . Goiter, specified as simple   . Hyperlipidemia   . CKD (chronic kidney disease), stage IV ([place])     Most recent creatinine was 1.82  . Hypertension 1960  . Chronic atrial fibrillation ([place]) 2014    a. Eliquis 5 mg bid; CHADSVASc 5 (CHF, HTN, age x 2, female)  . Lower extremity edema   . Chronic diastolic CHF (congestive heart failure) ([place])   . Pulmonary HTN [facility])     Patient Active Problem List   Diagnosis Date Noted  . CKD (chronic kidney disease), stage IV ([place])   . Hyperlipidemia   . Lower extremity edema   . Chronic atrial fibrillation ([place])   . Chronic diastolic CHF (congestive heart failure) ([place])   . Pulmonary HTN ([place])   . Anemia of chronic disease   . Obstructive sleep  apnea 02/17/2015  . Breast cancer, invasive mammary. T1,N0,M0. ER/PR pos, Her 2 neg 02/08/2014  . Chronic diastolic heart failure ([place]) 02/04/2014  . Atrial fibrillation ([place]) 05/12/2013  . Hypertension     Past Surgical History  Procedure Laterality Date  . Mammosite balloon placement  Right 11/2010  . Colonoscopy  2011  . Breast lumpectomy Right 50 years ago  . Breast lumpectomy Right 2012    invasive   . Cardiac catheterization  06/2010    [facility]: Mild nonobstructive coronary artery disease with normal ejection fraction.  . Cardioversion      Allergies Celebrex and Other  Social History Social History  Substance Use Topics  . Smoking status: Never Smoker   . Smokeless tobacco: Never Used  . Alcohol Use: No    Review of Systems Constitutional: Negative for fever. Eyes: Negative for visual changes. ENT: Negative for sore throat. Cardiovascular: Negative for chest pain. Respiratory: Negative for shortness of breath. Gastrointestinal: Negative for abdominal pain, vomiting and diarrhea. Genitourinary: Negative for dysuria. Musculoskeletal: Negative for back pain. Positive for edema Skin: Negative for rash. Neurological: Negative for headaches, positive for weakness  10-point ROS otherwise negative.  ____________________________________________   PHYSICAL EXAM:  VITAL SIGNS: ED Triage Vitals  Enc Vitals Group     BP --      Pulse --  Resp --      Temp --      Temp src --      SpO2 --      Weight --      Height --      Head Cir --      Peak Flow --      Pain Score --      Pain Loc --      Pain Edu? --      Excl. in [place]? --     Constitutional: Alert and oriented. Mild distress. Generalize widespread edema is noted Eyes: Conjunctivae are normal. PERRL. Normal extraocular movements. ENT   Head: Normocephalic and atraumatic.   Nose: No congestion/rhinnorhea.   Mouth/Throat: Mucous membranes are moist.   Neck: No stridor. Cardiovascular:  Rapid rate, irregular rhythm. Normal and symmetric distal pulses are present in all extremities. No murmurs, rubs, or gallops. Respiratory: Normal respiratory effort without tachypnea nor retractions. Breath sounds are clear and equal bilaterally. No wheezes/rales/rhonchi. Gastrointestinal: Soft and nontender. No distention. No abdominal bruits.  Musculoskeletal: Limited range of motion of the extremities, bilateral pitting edema to above the knees bilaterally. Neurologic:  Normal speech and language. No gross focal neurologic deficits are appreciated.  Skin:  Skin is warm, dry and intact. Pallor is noted Psychiatric: Mood and affect are normal. Speech and behavior are normal. Patient exhibits appropriate insight and judgment. ____________________________________________  EKG: Interpreted by me. A 2 fibrillation with a rate of 87 bpm, normal QRS, normal QT interval. Normal axis. ST depressions are noted  ____________________________________________  ED COURSE:  Pertinent labs & imaging results that were available during my care of the patient were reviewed by me and considered in my medical decision making (see chart for details). Patient appears acutely ill with tachycardia and significant edema. Patient will likely need dialysis. IV Cardizem will be given for rate control ____________________________________________    LABS (pertinent positives/negatives)  Labs Reviewed  CBC WITH DIFFERENTIAL/PLATELET - Abnormal; Notable for the following:    WBC 11.3 (*)    RBC 3.12 (*)    Hemoglobin 8.9 (*)    HCT 27.7 (*)    RDW 16.5 (*)    Neutro Abs 10.0 (*)    Lymphs Abs 0.7 (*)    All other components within normal limits  BRAIN NATRIURETIC PEPTIDE - Abnormal; Notable for the following:    B Natriuretic Peptide 809.0 (*)    All other components within normal limits  TROPONIN I - Abnormal; Notable for the following:    Troponin I 0.08 (*)    All other components within normal limits   COMPREHENSIVE METABOLIC PANEL - Abnormal; Notable for the following:    Glucose, Bld 135 (*)    BUN 85 (*)    Creatinine, Ser 4.19 (*)    Albumin 2.5 (*)    AST 14 (*)    GFR calc non Af Amer 9 (*)    GFR calc Af Amer 11 (*)    All other components within normal limits    RADIOLOGY Images were viewed by me  Chest x-ray  CRITICAL CARE Performed by: Earleen Newport   Total critical care time: 30 minutes  Critical care time was exclusive of separately billable procedures and treating other patients.  Critical care was necessary to treat or prevent imminent or life-threatening deterioration.  Critical care was time spent personally by me on the following activities: development of treatment plan with patient and/or surrogate as well as nursing, discussions  with consultants, evaluation of patient's response to treatment, examination of patient, obtaining history from patient or surrogate, ordering and performing treatments and interventions, ordering and review of laboratory studies, ordering and review of radiographic studies, pulse oximetry and re-evaluation of patient's condition.  ____________________________________________  FINAL ASSESSMENT AND PLAN  Anasarca, renal failure, rapid atrial fibrillation  Plan: Patient with labs and imaging as dictated above. Patient's heart rate was improved just after an additional dose of Cardizem, But then subsequently became racing again. She was placed on a Cardizem drip.[name] She will possibly need dialysis as well. Her creatinine has progressively gotten worse and is evidenced by her edema.   Earleen Newport, MD   Earleen Newport, MD 08/22/15 [phone]

## 2015-08-23 ENCOUNTER — Inpatient Hospital Stay: Admission: RE | Admit: 2015-08-23 | Payer: Medicare HMO | Source: Ambulatory Visit

## 2015-08-23 ENCOUNTER — Encounter: Payer: Self-pay | Admitting: Student

## 2015-08-23 ENCOUNTER — Inpatient Hospital Stay: Payer: Medicare HMO

## 2015-08-23 DIAGNOSIS — D631 Anemia in chronic kidney disease: Secondary | ICD-10-CM | POA: Insufficient documentation

## 2015-08-23 DIAGNOSIS — N189 Chronic kidney disease, unspecified: Secondary | ICD-10-CM

## 2015-08-23 DIAGNOSIS — E46 Unspecified protein-calorie malnutrition: Secondary | ICD-10-CM

## 2015-08-23 DIAGNOSIS — D649 Anemia, unspecified: Secondary | ICD-10-CM

## 2015-08-23 DIAGNOSIS — R601 Generalized edema: Secondary | ICD-10-CM

## 2015-08-23 DIAGNOSIS — I5033 Acute on chronic diastolic (congestive) heart failure: Secondary | ICD-10-CM

## 2015-08-23 DIAGNOSIS — N186 End stage renal disease: Secondary | ICD-10-CM

## 2015-08-23 DIAGNOSIS — L8990 Pressure ulcer of unspecified site, unspecified stage: Secondary | ICD-10-CM

## 2015-08-23 DIAGNOSIS — E8809 Other disorders of plasma-protein metabolism, not elsewhere classified: Secondary | ICD-10-CM

## 2015-08-23 DIAGNOSIS — N184 Chronic kidney disease, stage 4 (severe): Secondary | ICD-10-CM

## 2015-08-23 DIAGNOSIS — I4891 Unspecified atrial fibrillation: Secondary | ICD-10-CM

## 2015-08-23 LAB — BASIC METABOLIC PANEL
ANION GAP: 12 (ref 5–15)
BUN: 89 mg/dL — ABNORMAL HIGH (ref 6–20)
CALCIUM: 9.5 mg/dL (ref 8.9–10.3)
CO2: 25 mmol/L (ref 22–32)
Chloride: 107 mmol/L (ref 101–111)
Creatinine, Ser: 4.14 mg/dL — ABNORMAL HIGH (ref 0.44–1.00)
GFR, EST AFRICAN AMERICAN: 11 mL/min — AB (ref >60)
GFR, EST NON AFRICAN AMERICAN: 9 mL/min — AB (ref >60)
Glucose, Bld: 135 mg/dL — ABNORMAL HIGH (ref 65–99)
Potassium: 3.2 mmol/L — ABNORMAL LOW (ref 3.5–5.1)
Sodium: 144 mmol/L (ref 135–145)

## 2015-08-23 LAB — CBC
HCT: 25.6 % — ABNORMAL LOW (ref 35.0–47.0)
HEMOGLOBIN: 8.2 g/dL — AB (ref 12.0–16.0)
MCH: 28.4 pg (ref 26.0–34.0)
MCHC: 32.3 g/dL (ref 32.0–36.0)
MCV: 88 fL (ref 80.0–100.0)
Platelets: 284 10*3/uL (ref 150–440)
RBC: 2.9 MIL/uL — AB (ref 3.80–5.20)
RDW: 16.2 % — ABNORMAL HIGH (ref 11.5–14.5)
WBC: 9.2 10*3/uL (ref 3.6–11.0)

## 2015-08-23 LAB — POTASSIUM: POTASSIUM: 4.8 mmol/L (ref 3.5–5.1)

## 2015-08-23 MED ORDER — OXYCODONE-ACETAMINOPHEN 5-325 MG PO TABS
1.0000 | ORAL_TABLET | Freq: Four times a day (QID) | ORAL | Status: DC | PRN
  Administered 2015-08-23: 1 via ORAL
  Filled 2015-08-23: qty 1

## 2015-08-23 MED ORDER — POTASSIUM CHLORIDE 20 MEQ PO PACK
40.0000 meq | PACK | Freq: Once | ORAL | Status: AC
  Administered 2015-08-23: 40 meq via ORAL
  Filled 2015-08-23: qty 2

## 2015-08-23 MED ORDER — POTASSIUM CHLORIDE 10 MEQ/100ML IV SOLN
10.0000 meq | INTRAVENOUS | Status: DC
  Administered 2015-08-23: 10 meq via INTRAVENOUS
  Filled 2015-08-23 (×4): qty 100

## 2015-08-23 MED ORDER — FUROSEMIDE 10 MG/ML IJ SOLN
8.0000 mg/h | INTRAMUSCULAR | Status: DC
  Administered 2015-08-23 – 2015-08-29 (×3): 8 mg/h via INTRAVENOUS
  Filled 2015-08-23 (×7): qty 25

## 2015-08-23 MED ORDER — ALBUMIN HUMAN 25 % IV SOLN
25.0000 g | Freq: Two times a day (BID) | INTRAVENOUS | Status: DC
  Administered 2015-08-23 – 2015-08-29 (×13): 25 g via INTRAVENOUS
  Filled 2015-08-23 (×15): qty 100

## 2015-08-23 MED ORDER — APIXABAN 5 MG PO TABS
5.0000 mg | ORAL_TABLET | Freq: Two times a day (BID) | ORAL | Status: DC
  Administered 2015-08-23 – 2015-08-27 (×9): 5 mg via ORAL
  Filled 2015-08-23 (×9): qty 1

## 2015-08-23 NOTE — Progress Notes (Signed)
[place] NOTE  Pharmacy Consult for electrolyte monitoring  Indication: furosemide drip    Allergies  Allergen Reactions  . Celebrex [Celecoxib] Hives    Patient Measurements: Height: 5\' 7"  (170.2 cm) Weight: 194 lb (87.998 kg) IBW/kg (Calculated) : 61.6   Vital Signs: Temp: 98 F (36.7 C) (02/28 1652) Temp Source: Oral (02/28 1652) BP: 139/68 mmHg (02/28 1652) Pulse Rate: 66 (02/28 1652) Intake/Output from previous day: 02/27 0701 - 02/28 0700 In: 486.3 [P.O.:360; I.V.:126.3] Out: 600 [Urine:600] Intake/Output from this shift:   Vent settings for last 24 hours:    Labs:  Recent Labs  08/22/15 1519 08/22/15 1915 08/23/15 0636  WBC 11.3*  --  9.2  HGB 8.9*  --  8.2*  HCT 27.7*  --  25.6*  PLT 327  --  284  CREATININE 4.19*  --  4.14*  MG  --  2.3  --   PHOS  --  5.6*  --   ALBUMIN 2.5*  --   --   PROT 6.6  --   --   AST 14*  --   --   ALT 18  --   --   ALKPHOS 65  --   --   BILITOT 0.5  --   --    Estimated Creatinine Clearance: 12.8 mL/min (by C-G formula based on Cr of 4.14).   Recent Labs  08/22/15 1823 08/22/15 1947  GLUCAP 97 87    Microbiology: Recent Results (from the past 720 hour(s))  MRSA PCR Screening     Status: None   Collection Time: 08/22/15  6:36 PM  Result Value Ref Range Status   MRSA by PCR NEGATIVE NEGATIVE Final    Comment:        The GeneXpert MRSA Assay (FDA approved for NASAL specimens only), is one component of a comprehensive MRSA colonization surveillance program. It is not intended to diagnose MRSA infection nor to guide or monitor treatment for MRSA infections.     Medications:  Scheduled:  . albumin human  25 g Intravenous Q12H  . apixaban  5 mg Oral BID  . calcitRIOL  0.25 mcg Oral Q M,W,F  . diltiazem  120 mg Oral Daily  . ferrous sulfate  325 mg Oral Q breakfast  . letrozole  2.5 mg Oral Daily  . levothyroxine  25 mcg Oral QAC breakfast  . metoprolol  150 mg Oral BID  .  sodium chloride flush  3 mL Intravenous Q12H  . sodium chloride flush  3 mL Intravenous Q12H   Infusions:  . diltiazem (CARDIZEM) infusion Stopped (08/23/15 1201)  . furosemide (LASIX) infusion 8 mg/hr (08/23/15 1500)    Assessment: Pharmacy consulted for electrolyte monitoring and management for 81 yo female currently ordered furosemide drip 8mg /hr.   2/28 1800 K=4.8  Plan:  Will obtain follow up BMP with am labs on 3/1.   Pharmacy will continue to monitor and adjust per consult.    Paulina Fusi, PharmD, BCPS 08/23/2015 7:18 PM

## 2015-08-23 NOTE — Progress Notes (Signed)
Pt transferred to 2A room 254 from CCU. Tele box confirmed with CCMD by Candace, NT and skin verified with Crystal, RN. Pt oriented to unit and room. Lasix gtt restarted at 58ml/hr. Pt currently resting comfortably in room with call bell within reach. Nursing will continue to assess.

## 2015-08-23 NOTE — Progress Notes (Signed)
[place] NOTE  Pharmacy Consult for electrolyte monitoring  Indication: furosemide drip    Allergies  Allergen Reactions  . Celebrex [Celecoxib] Hives    Patient Measurements: Height: 5\' 7"  (170.2 cm) Weight: 194 lb (87.998 kg) IBW/kg (Calculated) : 61.6   Vital Signs: Temp: 98.5 F (36.9 C) (02/28 0730) Temp Source: Oral (02/28 0730) BP: 112/79 mmHg (02/28 1400) Pulse Rate: 48 (02/28 1400) Intake/Output from previous day: 02/27 0701 - 02/28 0700 In: 486.3 [P.O.:360; I.V.:126.3] Out: 600 [Urine:600] Intake/Output from this shift: Total I/O In: 626.6 [P.O.:360; I.V.:66.6; IV Piggyback:200] Out: 325 [Urine:325] Vent settings for last 24 hours:    Labs:  Recent Labs  08/22/15 1519 08/22/15 1915 08/23/15 0636  WBC 11.3*  --  9.2  HGB 8.9*  --  8.2*  HCT 27.7*  --  25.6*  PLT 327  --  284  CREATININE 4.19*  --  4.14*  MG  --  2.3  --   PHOS  --  5.6*  --   ALBUMIN 2.5*  --   --   PROT 6.6  --   --   AST 14*  --   --   ALT 18  --   --   ALKPHOS 65  --   --   BILITOT 0.5  --   --    Estimated Creatinine Clearance: 12.8 mL/min (by C-G formula based on Cr of 4.14).   Recent Labs  08/22/15 1823 08/22/15 1947  GLUCAP 97 87    Microbiology: Recent Results (from the past 720 hour(s))  MRSA PCR Screening     Status: None   Collection Time: 08/22/15  6:36 PM  Result Value Ref Range Status   MRSA by PCR NEGATIVE NEGATIVE Final    Comment:        The GeneXpert MRSA Assay (FDA approved for NASAL specimens only), is one component of a comprehensive MRSA colonization surveillance program. It is not intended to diagnose MRSA infection nor to guide or monitor treatment for MRSA infections.     Medications:  Scheduled:  . albumin human  25 g Intravenous Q12H  . apixaban  5 mg Oral BID  . calcitRIOL  0.25 mcg Oral Q M,W,F  . diltiazem  120 mg Oral Daily  . ferrous sulfate  325 mg Oral Q breakfast  . letrozole  2.5 mg Oral  Daily  . levothyroxine  25 mcg Oral QAC breakfast  . metoprolol  150 mg Oral BID  . sodium chloride flush  3 mL Intravenous Q12H  . sodium chloride flush  3 mL Intravenous Q12H   Infusions:  . diltiazem (CARDIZEM) infusion Stopped (08/23/15 1201)  . furosemide (LASIX) infusion 8 mg/hr (08/23/15 0905)    Assessment: Pharmacy consulted for electrolyte monitoring and management for 81 yo female currently ordered furosemide drip 8mg /hr.   Plan:  Will obtain potassium level at 1800. Will obtain follow up BMP with am labs on 3/1.   Pharmacy will continue to monitor and adjust per consult.    Jeniah Kishi L 08/23/2015,3:21 PM

## 2015-08-23 NOTE — Progress Notes (Signed)
[facility] [place] NAME: Teresa Lutz    MR#:  [id number]  DATE OF BIRTH:  10-Oct-1934  SUBJECTIVE:  Complains of bilateral leg pain secondary to swelling Patient surrounded by family nurse at bedside Admitted yesterday given signs of symptoms of congestive heart failure-was in atrial fibrillation rapid ventricular response requiring Cardizem drip  REVIEW OF SYSTEMS:  CONSTITUTIONAL: No fever, fatigue or weakness.  EYES: No blurred or double vision.  EARS, NOSE, AND THROAT: No tinnitus or ear pain.  RESPIRATORY: No cough, shortness of breath, wheezing or hemoptysis.  CARDIOVASCULAR: No chest pain, positive orthopnea, positive edema.  GASTROINTESTINAL: No nausea, vomiting, diarrhea or abdominal pain.  GENITOURINARY: No dysuria, hematuria.  ENDOCRINE: No polyuria, nocturia,  HEMATOLOGY: No anemia, easy bruising or bleeding SKIN: No rash or lesion. MUSCULOSKELETAL: No joint pain or arthritis.   NEUROLOGIC: No tingling, numbness, weakness.  PSYCHIATRY: No anxiety or depression.   DRUG ALLERGIES:   Allergies  Allergen Reactions  . Celebrex [Celecoxib] Hives    VITALS:  Blood pressure 112/79, pulse 48, temperature 98.5 F (36.9 C), temperature source Oral, resp. rate 22, height 5\' 7"  (1.702 m), weight 87.998 kg (194 lb), SpO2 96 %.  PHYSICAL EXAMINATION:  VITAL SIGNS: Filed Vitals:   08/23/15 1300 08/23/15 1400  BP: 140/96 112/79  Pulse: 83 48  Temp:    Resp: 15 70   GENERAL:81 y.o.female currently in no acute distress.  HEAD: Normocephalic, atraumatic.  EYES: Pupils equal, round, reactive to light. Extraocular muscles intact. No scleral icterus.  MOUTH: Moist mucosal membrane. Dentition intact. No abscess noted.  EAR, NOSE, THROAT: Clear without exudates. No external lesions.  NECK: Supple. No thyromegaly. No nodules. No JVD.  PULMONARY: Diminished without wheeze rails or rhonci. No use of accessory muscles, Good  respiratory effort. good air entry bilaterally CHEST: Nontender to palpation.  CARDIOVASCULAR: S1 and S2. Irregular rate and rhythm. No murmurs, rubs, or gallops. 3+ edema. Pedal pulses 2+ bilaterally.  GASTROINTESTINAL: Soft, nontender, nondistended. No masses. Positive bowel sounds. No hepatosplenomegaly.  MUSCULOSKELETAL: No swelling, clubbing, or edema. Range of motion full in all extremities.  NEUROLOGIC: Cranial nerves II through XII are intact. No gross focal neurological deficits. Sensation intact. Reflexes intact.  SKIN: No ulceration, lesions, rashes, or cyanosis. Skin warm and dry. Turgor intact.  PSYCHIATRIC: Mood, affect within normal limits. The patient is awake, alert and oriented x 3. Insight, judgment intact.      LABORATORY PANEL:   CBC  Recent Labs Lab 08/23/15 0636  WBC 9.2  HGB 8.2*  HCT 25.6*  PLT 284   ------------------------------------------------------------------------------------------------------------------  Chemistries   Recent Labs Lab 08/22/15 1519 08/22/15 1915 08/23/15 0636  NA 143  --  144  K 3.7  --  3.2*  CL 104  --  107  CO2 24  --  25  GLUCOSE 135*  --  135*  BUN 85*  --  89*  CREATININE 4.19*  --  4.14*  CALCIUM 9.9  --  9.5  MG  --  2.3  --   AST 14*  --   --   ALT 18  --   --   ALKPHOS 65  --   --   BILITOT 0.5  --   --    ------------------------------------------------------------------------------------------------------------------  Cardiac Enzymes  Recent Labs Lab 08/22/15 1519  TROPONINI 0.08*   ------------------------------------------------------------------------------------------------------------------  RADIOLOGY:  US Renal  08/23/2015  CLINICAL DATA:  Acute renal failure. EXAM: RENAL / URINARY  TRACT ULTRASOUND COMPLETE COMPARISON:  None. FINDINGS: Right Kidney: Length: 10.3 cm. Mild increased cortical echogenicity. No hydronephrosis. 1.8 cm cyst over the lower pole. Left Kidney: Length: 10.5 cm.  Increased cortical echogenicity. No hydronephrosis. 4 cm cyst over the mid pole. Bladder: Appears normal for degree of bladder distention. IMPRESSION: Normal size kidneys without hydronephrosis. Mild increased cortical echogenicity compatible medical renal disease. Single bilateral renal cysts as described. Electronically Signed   By: Marin Olp M.D.   On: 08/23/2015 12:24   Dg Chest Port 1 View  08/22/2015  CLINICAL DATA:  BILATERAL lower extremity swelling since this weekend, known CHF, dyspnea, breast cancer, chronic atrial fibrillation, pulmonary hypertension EXAM: PORTABLE CHEST 1 VIEW COMPARISON:  Portable exam 1533 hours compared to 08/01/2015 FINDINGS: Minimal enlargement of cardiac silhouette. Tortuosity of thoracic aorta. Pulmonary vascularity normal. Chronic RIGHT basilar atelectasis. Lungs otherwise clear. No pleural effusion or pneumothorax. Bones demineralized with degenerative disc disease changes thoracic spine and BILATERAL glenohumeral degenerative changes. IMPRESSION: Minimal enlargement of cardiac silhouette with chronic RIGHT basilar atelectasis. Electronically Signed   By: Lavonia Dana M.D.   On: 08/22/2015 15:50    EKG:   Orders placed or performed during the hospital encounter of 08/22/15  . ED EKG  . ED EKG    ASSESSMENT AND PLAN:   81 year old African female history of atrial fibrillation, chronic kidney disease stage V, congestive heart failure ejection fraction 60-65 presenting with worsening edema  1. Acute on chronic diastolic congestive heart failure: Cardiology nephrology input appreciated, continue diuresis with IV Lasix for urine output renal function, DuoNeb treatments as required, oxygen as required 2. Atrial fibrillation rapid ventricular response: Convert to oral Cardizem 2. Acute kidney injury on chronic kidney disease: Continue with diuresis hopefully increase cardiac output will have improvement of renal function otherwise may require dialysis  3.  Hypothyroidism unspecified: Synthroid 4. Hypertension essential: Lopressor, Benicar 5. Venous thromboembolism prophylactic: Eliquis therapeutic  Disposition: Transfer out of intensive care unit, physical therapy evaluation   All the records are reviewed and case discussed with Care Management/Social Workerr. Management plans discussed with the patient, family and they are in agreement.  CODE STATUS: Full  TOTAL TIME TAKING CARE OF THIS PATIENT: 33 minutes.   POSSIBLE D/C IN 2-3 DAYS, DEPENDING ON CLINICAL CONDITION.   Hower,  Karenann Cai.D on 08/23/2015 at 2:45 PM  Between 7am to 6pm - Pager - [phone]  After 6pm: House Pager: - [facility]  Office  [phone]  CC: Primary care physician; Casilda Carls, MD

## 2015-08-23 NOTE — Consult Note (Signed)
Cardiology Consult    Patient ID: Teresa Lutz MRN: [id number], DOB/AGE: 79-Oct-1936   Admit date: 08/22/2015 Date of Consult: 08/23/2015  Primary Physician: Casilda Carls, MD Reason for Consult: Atrial Fibrillation with RVR Primary Cardiologist: Dr. Fletcher Anon Requesting Provider: Dr. Bridgett Larsson   History of Present Illness    Teresa Lutz is a 81 y.o. female with past medical history of chronic atrial fibrillation (on Eliquis), chronic diastolic CHF, Stage 4 CKD, Pulmonary HTN, HLD, OSA (on CPAP), Type 2 DM and HTN who presented to [facility] on 08/22/2015 for increased lower extremity edema over the past week.  She was recently seen in the office by Dr. Fletcher Anon on 08/16/2015 for follow-up. His note at that time mentions poor medication compliance and hindered compliance with a low-sodium diet. Her weight in 04/2015 was 192 lbs, yet her weight at the time of her appointment was 216 lbs. She reported being without her medications for the past week.  She reports restarting her Torsemide that day but noticed minimal improvement in her swelling over the past several days. Her daughters brought her to the hospital due to her significant edema. She reports mild dyspnea and orthopnea. She denies any chest pain, palpitations, nausea, vomiting, or diaphoresis.  There is a recorded weight of 194lbs at time of admission, yet the patient reports telling a nurse in the ER that is what she thought her weight was. An actual weight was not obtained at that time. She does not weight herself at home. She has been "drinking more fluids than she should" over the past several weeks. She is unable to quantify an estimate of the actual amount she consumes.  In the ED, her BNP was elevated to 809. Troponin 0.08. Creatinine 4.19 (was 3.39 in 02/2015). WBC 11.3. Hgb 8.9. Platelets 327. Mg 2.3. Phosphorus 5.6. CXR showing minimal enlargement of the cardiac silhouette with chronic right basilar atelectasis. Her EKG in the ED showed  atrial fibrillation with RVR and her HR was noted to have been in the 140's. She was started on IV Cardizem.   Nephrology has consulted on the patient and started a Lasix drip and IV Albumin with her significant edema.   Past Medical History   Past Medical History  Diagnosis Date  . Arthritis 1990  . Breast cancer ([place]) 2012    Right stage 1  . Ulcer 1980  . Lump or mass in breast     right  . Malignant neoplasm of upper-inner quadrant of female breast ([place])     right  . Hemorrhoid   . Unspecified vitamin D deficiency   . Anemia of chronic disease   . Hypopotassemia   . Unspecified hypothyroidism   . Goiter, specified as simple   . Hyperlipidemia   . CKD (chronic kidney disease), stage IV ([place])     Most recent creatinine was 1.82  . Hypertension 1960  . Chronic atrial fibrillation ([place]) 2014    a. Eliquis 5 mg bid; CHADSVASc 5 (CHF, HTN, age x 2, female)  . Lower extremity edema   . Chronic diastolic CHF (congestive heart failure) ([place])   . Pulmonary HTN ([place])   . Atrial fibrillation [facility])     Past Surgical History  Procedure Laterality Date  . Mammosite balloon placement  Right 11/2010  . Colonoscopy  2011  . Breast lumpectomy Right 50 years ago  . Breast lumpectomy Right 2012    invasive   . Cardiac catheterization  06/2010    [facility]: Mild nonobstructive coronary  artery disease with normal ejection fraction.  . Cardioversion       Allergies  Allergies  Allergen Reactions  . Celebrex [Celecoxib] Hives    Inpatient Medications    . albumin human  25 g Intravenous Q12H  . apixaban  5 mg Oral BID  . calcitRIOL  0.25 mcg Oral Q M,W,F  . diltiazem  120 mg Oral Daily  . ferrous sulfate  325 mg Oral Q breakfast  . letrozole  2.5 mg Oral Daily  . levothyroxine  25 mcg Oral QAC breakfast  . metoprolol  150 mg Oral BID  . potassium chloride  10 mEq Intravenous Q1 Hr x 4  . sodium chloride flush  3 mL Intravenous Q12H  . sodium chloride flush  3 mL Intravenous Q12H     Family History    Family History  Problem Relation Age of Onset  . Cancer Mother     ovarian  . Hypertension Mother   . Cancer Brother     colon  . Stroke Father     Social History    Social History   Social History  . Marital Status: Single    Spouse Name: N/A  . Number of Children: N/A  . Years of Education: N/A   Occupational History  . Not on file.   Social History Main Topics  . Smoking status: Never Smoker   . Smokeless tobacco: Never Used  . Alcohol Use: No  . Drug Use: No  . Sexual Activity: Not on file   Other Topics Concern  . Not on file   Social History Narrative     Review of Systems    General:  No chills, fever, night sweats or weight changes.  Cardiovascular:  No chest pain, dyspnea on exertion, palpitations, paroxysmal nocturnal dyspnea. Positive for edema and orthopnea. Dermatological: No rash, lesions/masses Respiratory: No cough, Positive for dyspnea. Urologic: No hematuria, dysuria Abdominal:   No nausea, vomiting, diarrhea, bright red blood per rectum, melena, or hematemesis Neurologic:  No visual changes, wkns, changes in mental status. All other systems reviewed and are otherwise negative except as noted above.  Physical Exam    Blood pressure 127/77, pulse 82, temperature 98.5 F (36.9 C), temperature source Oral, resp. rate 18, height 5\' 7"  (1.702 m), weight 194 lb (87.998 kg), SpO2 100 %.  General: Pleasant, African American female appearing in NAD Psych: Normal affect. Neuro: Alert and oriented X 3. Moves all extremities spontaneously. HEENT: Normal  Neck: Supple without bruits. JVD minimally elevated. Lungs:  Resp regular and unlabored, minimal rales at the bases bilaterally. Heart: Irregularly irregular, no s3, s4, or murmurs. Abdomen: Soft, non-tender, non-distended, BS + x 4.  Extremities: No clubbing or cyanosis.  2+ edema up to knees bilaterally. DP/PT/Radials 2+ and equal bilaterally.  Labs    Troponin (Point of  Care Test) No results for input(s): TROPIPOC in the last 72 hours.  Recent Labs  08/22/15 1519  TROPONINI 0.08*   Lab Results  Component Value Date   WBC 9.2 08/23/2015   HGB 8.2* 08/23/2015   HCT 25.6* 08/23/2015   MCV 88.0 08/23/2015   PLT 284 08/23/2015    Recent Labs Lab 08/22/15 1519 08/23/15 0636  NA 143 144  K 3.7 3.2*  CL 104 107  CO2 24 25  BUN 85* 89*  CREATININE 4.19* 4.14*  CALCIUM 9.9 9.5  PROT 6.6  --   BILITOT 0.5  --   ALKPHOS 65  --   ALT 18  --  AST 14*  --   GLUCOSE 135* 135*     Radiology Studies    Dg Chest Port 1 View: 09-02-15  CLINICAL DATA:  BILATERAL lower extremity swelling since this weekend, known CHF, dyspnea, breast cancer, chronic atrial fibrillation, pulmonary hypertension EXAM: PORTABLE CHEST 1 VIEW COMPARISON:  Portable exam 1533 hours compared to 08/01/2015 FINDINGS: Minimal enlargement of cardiac silhouette. Tortuosity of thoracic aorta. Pulmonary vascularity normal. Chronic RIGHT basilar atelectasis. Lungs otherwise clear. No pleural effusion or pneumothorax. Bones demineralized with degenerative disc disease changes thoracic spine and BILATERAL glenohumeral degenerative changes. IMPRESSION: Minimal enlargement of cardiac silhouette with chronic RIGHT basilar atelectasis. Electronically Signed   By: Lavonia Dana M.D.   On: 09/02/15 15:50    EKG & Cardiac Imaging    EKG: Atrial fibrillation, HR in 90's. No acute ST or T-wave changes.  Echocardiogram: 05/02/2015 Study Conclusions - Left ventricle: The cavity size was normal. There was mild concentric hypertrophy. Systolic function was normal. The estimated ejection fraction was in the range of 60% to 65%. Wall motion was normal; there were no regional wall motion abnormalities. The study is not technically sufficient to allow evaluation of LV diastolic function. - Left atrium: The atrium was mildly dilated. - Right ventricle: Systolic function was normal. -  Pulmonary arteries: Systolic pressure was mildly elevated PA peak pressure: 41 mm Hg (S).  Impressions: - Rhythm is atrial fibrillation.  Assessment & Plan     1. Acute on Chronic Diastolic CHF - presented with worsening lower extremity edema and mild orthopnea and dyspnea. Was seen by Dr. Fletcher Anon on 08/16/2015 and reported not taking her diuretic for over a week.  - BNP 809 on admission. CXR showed minimal enlargement of the cardiac silhouette with chronic right basilar atelectasis. - Weight 216 lbs on 08/16/2015. In 04/2015, she was 192 lbs. Weight at time of admission was 194 lbs, yet that is what the patient told them she thought she weighed. An actual weight was not obtained. Will need to obtain daily weights.  - fluid and sodium restriction reviewed with the patient. Compliance with this and her medications will be key going forward. - Appreciate Nephrology recommendations. They have started an IV Lasix drip at 8mg /hr. Continue to obtain strict I&O's.  2. Chronic atrial fibrillation - presented in atrial fibrillation with RVR, HR in the 140's. Likely secondary to her significant volume overload. Started on IV Cardizem at time of admission. HR currently well-controlled in the 70's - 80's.  - will wean her IV Cardizem and restart Cardizem CD 120mg  daily as already entered in the [facility] by Dr. Rockey Situ - This patients CHA2DS2-VASc Score and unadjusted Ischemic Stroke Rate (% per year) is equal to 7.2 % stroke rate/year from a score of 5 (CHF, HTN, Female, Age (2)). Continue Eliquis for anticoagulation.  3. Stage 4 CKD - creatinine 4.19 on admission, at 4.14 on 08/23/2015. Was 3.39 in 02/2015. - Nephrology following. May require HD.   4. HTN - BP has been 106/55 - 157/108 in the past 24 hours. - continue current medication regimen.  5. HLD - reported in her history, but not currently on statin therapy. - will obtain Lipid Panel  6. OSA  - on CPAP  7. Anemia of Chronic Kidney Disease  -  Hgb 8.2 on 08/23/2015. Was 9.3 two months ago. - Nephrology following.   Signed, Erma Heritage, PA-C 08/23/2015, 9:31 AM Pager: [phone]

## 2015-08-23 NOTE — Progress Notes (Signed)
[facility]  ROUNDING NOTE   Subjective:  Patient is well known to [place]. She has underlying chronic kidney disease stage IV/5. Her baseline EGFR was 18 at the last visit however she has been lower than this in the relatively recent past. She presents now with increasing lower extremity edema as well as shortness of breath. She was on torsemide as well as metolazone at home. Her weight however appears to be stable at 194 pounds. She presented in atrial fibrillation with rapid ventricular response and is currently on a diltiazem drip.   Objective:  Vital signs in last 24 hours:  Temp:  [98.2 F (36.8 C)-98.5 F (36.9 C)] 98.5 F (36.9 C) (02/28 0730) Pulse Rate:  [65-133] 85 (02/28 0800) Resp:  [12-28] 19 (02/28 0800) BP: (106-157)/(55-108) 127/74 mmHg (02/28 0800) SpO2:  [94 %-100 %] 100 % (02/28 0800) Weight:  [87.998 kg (194 lb)-97.886 kg (215 lb 12.8 oz)] 87.998 kg (194 lb) (02/27 2000)  Weight change:  Filed Weights   08/22/15 1523 08/22/15 2000  Weight: 97.886 kg (215 lb 12.8 oz) 87.998 kg (194 lb)    Intake/Output: I/O last 3 completed shifts: In: 486.3 [P.O.:360; I.V.:126.3] Out: 600 [Urine:600]   Intake/Output this shift:  Total I/O In: 20 [I.V.:20] Out: -   Physical Exam: General: NAD, resting in bed  Head: Normocephalic, atraumatic. Moist oral mucosal membranes  Eyes: Anicteric, PERRL  Neck: Supple, trachea midline  Lungs:  basiilar rales, normal effort  Heart: S1S2 irregular  Abdomen:  Soft, nontender, BS [resemt   Extremities: 3+ peripheral edema.  Neurologic: Nonfocal, moving all four extremities  Skin: No lesions       Basic Metabolic Panel:  Recent Labs Lab 08/22/15 1519 08/22/15 1915 08/23/15 0636  NA 143  --  144  K 3.7  --  3.2*  CL 104  --  107  CO2 24  --  25  GLUCOSE 135*  --  135*  BUN 85*  --  89*  CREATININE 4.19*  --  4.14*  CALCIUM 9.9  --  9.5  MG  --  2.3  --   PHOS  --  5.6*  --     Liver Function  Tests:  Recent Labs Lab 08/22/15 1519  AST 14*  ALT 18  ALKPHOS 65  BILITOT 0.5  PROT 6.6  ALBUMIN 2.5*   No results for input(s): LIPASE, AMYLASE in the last 168 hours. No results for input(s): AMMONIA in the last 168 hours.  CBC:  Recent Labs Lab 08/22/15 1519 08/23/15 0636  WBC 11.3* 9.2  NEUTROABS 10.0*  --   HGB 8.9* 8.2*  HCT 27.7* 25.6*  MCV 88.8 88.0  PLT 327 284    Cardiac Enzymes:  Recent Labs Lab 08/22/15 1519  TROPONINI 0.08*    BNP: Invalid input(s): POCBNP  CBG:  Recent Labs Lab 08/22/15 1823 08/22/15 1947  GLUCAP 97 87    Microbiology: Results for orders placed or performed during the hospital encounter of 08/22/15  MRSA PCR Screening     Status: None   Collection Time: 08/22/15  6:36 PM  Result Value Ref Range Status   MRSA by PCR NEGATIVE NEGATIVE Final    Comment:        The GeneXpert MRSA Assay (FDA approved for NASAL specimens only), is one component of a comprehensive MRSA colonization surveillance program. It is not intended to diagnose MRSA infection nor to guide or monitor treatment for MRSA infections.     Coagulation Studies:  No results for input(s): LABPROT, INR in the last 72 hours.  Urinalysis: No results for input(s): COLORURINE, LABSPEC, PHURINE, GLUCOSEU, HGBUR, BILIRUBINUR, KETONESUR, PROTEINUR, UROBILINOGEN, NITRITE, LEUKOCYTESUR in the last 72 hours.  Invalid input(s): APPERANCEUR    Imaging: Dg Chest Port 1 View  08/22/2015  CLINICAL DATA:  BILATERAL lower extremity swelling since this weekend, known CHF, dyspnea, breast cancer, chronic atrial fibrillation, pulmonary hypertension EXAM: PORTABLE CHEST 1 VIEW COMPARISON:  Portable exam 1533 hours compared to 08/01/2015 FINDINGS: Minimal enlargement of cardiac silhouette. Tortuosity of thoracic aorta. Pulmonary vascularity normal. Chronic RIGHT basilar atelectasis. Lungs otherwise clear. No pleural effusion or pneumothorax. Bones demineralized with  degenerative disc disease changes thoracic spine and BILATERAL glenohumeral degenerative changes. IMPRESSION: Minimal enlargement of cardiac silhouette with chronic RIGHT basilar atelectasis. Electronically Signed   By: Lavonia Dana M.D.   On: 08/22/2015 15:50     Medications:   . diltiazem (CARDIZEM) infusion 5 mg/hr (08/23/15 0800)  . furosemide (LASIX) infusion     . albumin human  25 g Intravenous Q12H  . apixaban  5 mg Oral BID  . calcitRIOL  0.25 mcg Oral Q M,W,F  . diltiazem  120 mg Oral Daily  . ferrous sulfate  325 mg Oral Q breakfast  . letrozole  2.5 mg Oral Daily  . levothyroxine  25 mcg Oral QAC breakfast  . metoprolol  150 mg Oral BID  . potassium chloride  10 mEq Intravenous Q1 Hr x 4  . sodium chloride flush  3 mL Intravenous Q12H  . sodium chloride flush  3 mL Intravenous Q12H   sodium chloride, acetaminophen **OR** acetaminophen, albuterol, ondansetron **OR** ondansetron (ZOFRAN) IV, sodium chloride flush  Assessment/ Plan:  81 y.o. female with atrial fibrillation, anemia, coronary artery disease, diabetes mellitus type 2, hypertension, hyperlipidemia, history of breast cancer, CKD stage IV/V who presents with increasing shortness of breath and edema.  1. Acute renal failure/chronic kidney disease stage IV/5.  Baseline EGFR 18 however EGFR has been lower than this in the past.  Patient has worsening renal function now. Unclear if this is progression to end-stage renal disease versus a true episode of acute renal failure. At this moment in time we will start the patient on a Lasix drip given worsening lower extremity edema. We will start her at 8 mg an hour and we will also add albumin 25 g IV every 12 hours. We will also check renal ultrasound to make sure there is no underlying obstruction.  If renal function continues to worsen we will likely need to consider hemodialysis.  2. Lower extremity edema. This appears to be quite prominent today. As above we will start the  patient on a Lasix drip.   3. Anemia of chronic kidney disease. Patient was followed by Dr. Grayland Ormond as an outpatient. Hemoglobin currently low at 8.2. We will start the patient on Epogen 20,000 units subcutaneous weekly.  4. Secondary hyperparathyroidism. Continue the patient calcitriol 0.25 g by mouth 3 times a week.  5. Hypokalemia. Potassium low at 3.2. We will replete this with potassium chloride 40 mg IV today.    LOS: 1 Aleaya Latona 2/28/20178:18 AM

## 2015-08-23 NOTE — Progress Notes (Signed)
Pt transferred to 2a, CCMD notified of transfer and tele box number. Confirmed pt on monitor. elink also notified.

## 2015-08-24 DIAGNOSIS — I119 Hypertensive heart disease without heart failure: Secondary | ICD-10-CM

## 2015-08-24 LAB — URINALYSIS COMPLETE WITH MICROSCOPIC (ARMC ONLY)
BILIRUBIN URINE: NEGATIVE
Glucose, UA: NEGATIVE mg/dL
KETONES UR: NEGATIVE mg/dL
NITRITE: POSITIVE — AB
PH: 5 (ref 5.0–8.0)
PROTEIN: 30 mg/dL — AB
Specific Gravity, Urine: 1.008 (ref 1.005–1.030)

## 2015-08-24 LAB — LIPID PANEL
CHOL/HDL RATIO: 3.9 ratio
CHOLESTEROL: 133 mg/dL (ref 0–200)
HDL: 34 mg/dL — ABNORMAL LOW (ref >40)
LDL Cholesterol: 65 mg/dL (ref 0–99)
TRIGLYCERIDES: 169 mg/dL — AB (ref <150)
VLDL: 34 mg/dL (ref 0–40)

## 2015-08-24 LAB — BASIC METABOLIC PANEL
Anion gap: 14 (ref 5–15)
BUN: 96 mg/dL — AB (ref 6–20)
CHLORIDE: 102 mmol/L (ref 101–111)
CO2: 26 mmol/L (ref 22–32)
Calcium: 10 mg/dL (ref 8.9–10.3)
Creatinine, Ser: 4.16 mg/dL — ABNORMAL HIGH (ref 0.44–1.00)
GFR, EST AFRICAN AMERICAN: 11 mL/min — AB (ref >60)
GFR, EST NON AFRICAN AMERICAN: 9 mL/min — AB (ref >60)
Glucose, Bld: 146 mg/dL — ABNORMAL HIGH (ref 65–99)
POTASSIUM: 3 mmol/L — AB (ref 3.5–5.1)
SODIUM: 142 mmol/L (ref 135–145)

## 2015-08-24 LAB — POTASSIUM: Potassium: 3.7 mmol/L (ref 3.5–5.1)

## 2015-08-24 MED ORDER — POTASSIUM CHLORIDE CRYS ER 20 MEQ PO TBCR: 40.0000 meq | EXTENDED_RELEASE_TABLET | Freq: Once | ORAL | Status: DC

## 2015-08-24 MED ORDER — DEXTROSE 5 % IV SOLN
1.0000 g | INTRAVENOUS | Status: DC
  Administered 2015-08-24 – 2015-08-27 (×4): 1 g via INTRAVENOUS
  Filled 2015-08-24 (×5): qty 10

## 2015-08-24 MED ORDER — POTASSIUM CHLORIDE 10 MEQ/100ML IV SOLN
10.0000 meq | INTRAVENOUS | Status: AC
  Administered 2015-08-24 (×4): 10 meq via INTRAVENOUS
  Filled 2015-08-24 (×4): qty 100

## 2015-08-24 NOTE — Progress Notes (Signed)
[facility]  ROUNDING NOTE   Subjective:  Urine output recorded as 1 L yesterday. Patient continues on Lasix drip. Potassium currently low at 3.0. Still has considerable lower extremity edema. Renal function not significantly changed with a creatinine of 4.1.   Objective:  Vital signs in last 24 hours:  Temp:  [97.9 F (36.6 C)-98 F (36.7 C)] 97.9 F (36.6 C) (03/01 1117) Pulse Rate:  [48-130] 130 (03/01 1117) Resp:  [15-22] 22 (03/01 1117) BP: (112-148)/(68-96) 148/75 mmHg (03/01 1117) SpO2:  [96 %-100 %] 99 % (03/01 1117) Weight:  [95.528 kg (210 lb 9.6 oz)-95.618 kg (210 lb 12.8 oz)] 95.618 kg (210 lb 12.8 oz) (03/01 0459)  Weight change: -2.359 kg (-5 lb 3.2 oz) Filed Weights   08/22/15 2000 08/23/15 2123 08/24/15 0459  Weight: 87.998 kg (194 lb) 95.528 kg (210 lb 9.6 oz) 95.618 kg (210 lb 12.8 oz)    Intake/Output: I/O last 3 completed shifts: In: 1338.8 [P.O.:720; I.V.:318.8; IV Piggyback:300] Out: 4540 [Urine:1675]   Intake/Output this shift:  Total I/O In: 240 [P.O.:240] Out: -   Physical Exam: General: NAD, resting in bed  Head: Normocephalic, atraumatic. Moist oral mucosal membranes  Eyes: Anicteric, PERRL  Neck: Supple, trachea midline  Lungs:  basiilar rales, normal effort  Heart: S1S2 irregular  Abdomen:  Soft, nontender, BS [resemt   Extremities: 3+ peripheral edema.  Neurologic: Nonfocal, moving all four extremities  Skin: No lesions       Basic Metabolic Panel:  Recent Labs Lab 08/22/15 1519 08/22/15 1915 08/23/15 0636 08/23/15 1744 08/24/15 0854  NA 143  --  144  --  142  K 3.7  --  3.2* 4.8 3.0*  CL 104  --  107  --  102  CO2 24  --  25  --  26  GLUCOSE 135*  --  135*  --  146*  BUN 85*  --  89*  --  96*  CREATININE 4.19*  --  4.14*  --  4.16*  CALCIUM 9.9  --  9.5  --  10.0  MG  --  2.3  --   --   --   PHOS  --  5.6*  --   --   --     Liver Function Tests:  Recent Labs Lab 08/22/15 1519  AST 14*  ALT 18   ALKPHOS 65  BILITOT 0.5  PROT 6.6  ALBUMIN 2.5*   No results for input(s): LIPASE, AMYLASE in the last 168 hours. No results for input(s): AMMONIA in the last 168 hours.  CBC:  Recent Labs Lab 08/22/15 1519 08/23/15 0636  WBC 11.3* 9.2  NEUTROABS 10.0*  --   HGB 8.9* 8.2*  HCT 27.7* 25.6*  MCV 88.8 88.0  PLT 327 284    Cardiac Enzymes:  Recent Labs Lab 08/22/15 1519  TROPONINI 0.08*    BNP: Invalid input(s): POCBNP  CBG:  Recent Labs Lab 08/22/15 1823 08/22/15 1947  GLUCAP 97 87    Microbiology: Results for orders placed or performed during the hospital encounter of 08/22/15  MRSA PCR Screening     Status: None   Collection Time: 08/22/15  6:36 PM  Result Value Ref Range Status   MRSA by PCR NEGATIVE NEGATIVE Final    Comment:        The GeneXpert MRSA Assay (FDA approved for NASAL specimens only), is one component of a comprehensive MRSA colonization surveillance program. It is not intended to diagnose MRSA infection nor to  guide or monitor treatment for MRSA infections.     Coagulation Studies: No results for input(s): LABPROT, INR in the last 72 hours.  Urinalysis: No results for input(s): COLORURINE, LABSPEC, PHURINE, GLUCOSEU, HGBUR, BILIRUBINUR, KETONESUR, PROTEINUR, UROBILINOGEN, NITRITE, LEUKOCYTESUR in the last 72 hours.  Invalid input(s): APPERANCEUR    Imaging: US Renal  08/23/2015  CLINICAL DATA:  Acute renal failure. EXAM: RENAL / URINARY TRACT ULTRASOUND COMPLETE COMPARISON:  None. FINDINGS: Right Kidney: Length: 10.3 cm. Mild increased cortical echogenicity. No hydronephrosis. 1.8 cm cyst over the lower pole. Left Kidney: Length: 10.5 cm. Increased cortical echogenicity. No hydronephrosis. 4 cm cyst over the mid pole. Bladder: Appears normal for degree of bladder distention. IMPRESSION: Normal size kidneys without hydronephrosis. Mild increased cortical echogenicity compatible medical renal disease. Single bilateral renal cysts  as described. Electronically Signed   By: Marin Olp M.D.   On: 08/23/2015 12:24   Dg Chest Port 1 View  08/22/2015  CLINICAL DATA:  BILATERAL lower extremity swelling since this weekend, known CHF, dyspnea, breast cancer, chronic atrial fibrillation, pulmonary hypertension EXAM: PORTABLE CHEST 1 VIEW COMPARISON:  Portable exam 1533 hours compared to 08/01/2015 FINDINGS: Minimal enlargement of cardiac silhouette. Tortuosity of thoracic aorta. Pulmonary vascularity normal. Chronic RIGHT basilar atelectasis. Lungs otherwise clear. No pleural effusion or pneumothorax. Bones demineralized with degenerative disc disease changes thoracic spine and BILATERAL glenohumeral degenerative changes. IMPRESSION: Minimal enlargement of cardiac silhouette with chronic RIGHT basilar atelectasis. Electronically Signed   By: Lavonia Dana M.D.   On: 08/22/2015 15:50     Medications:   . diltiazem (CARDIZEM) infusion Stopped (08/23/15 1201)  . furosemide (LASIX) infusion 8 mg/hr (08/24/15 0205)   . albumin human  25 g Intravenous Q12H  . apixaban  5 mg Oral BID  . calcitRIOL  0.25 mcg Oral Q M,W,F  . diltiazem  120 mg Oral Daily  . ferrous sulfate  325 mg Oral Q breakfast  . letrozole  2.5 mg Oral Daily  . levothyroxine  25 mcg Oral QAC breakfast  . metoprolol  150 mg Oral BID  . potassium chloride  10 mEq Intravenous Q1 Hr x 4  . sodium chloride flush  3 mL Intravenous Q12H  . sodium chloride flush  3 mL Intravenous Q12H   sodium chloride, acetaminophen **OR** acetaminophen, albuterol, ondansetron **OR** ondansetron (ZOFRAN) IV, oxyCODONE-acetaminophen, sodium chloride flush  Assessment/ Plan:  81 y.o. female with atrial fibrillation, anemia, coronary artery disease, diabetes mellitus type 2, hypertension, hyperlipidemia, history of breast cancer, CKD stage IV/V who presents with increasing shortness of breath and edema.  1. Acute renal failure/chronic kidney disease stage IV/5.  Baseline EGFR 18 however  EGFR has been lower than this in the past.  Patient has worsening renal function now. Unclear if this is progression to end-stage renal disease versus a true episode of acute renal failure.  -  Renal function not significantly changed. Creatinine remains high at 4.1. She may actually have end-stage renal disease. We will continue with Lasix drip for 1 additional day. However if renal function remains low like this she will likely need to convert to hemodialysis.  2. Lower extremity edema. Remains 3+ on exam today. Continue Lasix drip for 1 additional day.   3. Anemia of chronic kidney disease. Patient was followed by Dr. Grayland Ormond as an outpatient. Hemoglobin currently low at 8.2.  As she will be transitioning most likely to hemodialysis we would recommend initiating Epogen with dialysis.  4. Secondary hyperparathyroidism. Continue the patient calcitriol 0.25 g  by mouth 3 times a week.  5. Hypokalemia.  Potassium remains low at 3.0. Agree with administering potassium chloride 40 mEq IV today. May need to give additional potassium.   LOS: 2 Quantavious Eggert 3/1/201711:28 AM

## 2015-08-24 NOTE — Care Management (Signed)
Patient presents from home with weakness resulting in falls over the weekend and leg swelling. Patient does not contiribute much to the conversation.  She is very drowsy after just having a Percocet for pain.   Patient lives alone and has a Investment banker, operational [phone]).  [name] is an only child.  Patient 'dose not cook" and meals are provided by family members.  She has a cane and walker "with wheels on the front" but does not always use them.  She does have trouble performing her own bath.  She does not have life line.  Provided a brochure. Even though is very early in this hospitalization discussed discharge options: home with home health verses possible skilled nursing. It is too early to identify the specific plan. Patient says she does not want to go to a facility.   Discussed how this may benefit long range goals if it is indicated. If patient is able to discharge directly home, would like home health and there is no agency preference.  Heads up referral has been called to [facility] PT and Aide.  Daughter also requesting hospital bed and wheelchair.  Discussed the need to assess for medical necessity for this DME in order for insurance to cover.  There are concerns regarding renal status and the possibility of dialysis.  Plan is for lasix drip but now there are issues with venous access.  Patient declined PICC line.  this will be discussed with patient's daughter

## 2015-08-24 NOTE — Progress Notes (Signed)
[facility] NOTE  Pharmacy Consult for electrolyte monitoring  Indication: furosemide drip    Allergies  Allergen Reactions  . Celebrex [Celecoxib] Hives    Patient Measurements: Height: 5\' 7"  (170.2 cm) Weight: 210 lb 12.8 oz (95.618 kg) IBW/kg (Calculated) : 61.6   Vital Signs: Temp: 98 F (36.7 C) (03/01 0459) Temp Source: Oral (03/01 0459) BP: 140/80 mmHg (03/01 0738) Pulse Rate: 85 (03/01 0738) Intake/Output from previous day: 02/28 0701 - 03/01 0700 In: 872.6 [P.O.:360; I.V.:212.6; IV Piggyback:300] Out: 1075 [Urine:1075] Intake/Output from this shift: Total I/O In: 240 [P.O.:240] Out: -  Vent settings for last 24 hours:    Labs:  Recent Labs  08/22/15 1519 08/22/15 1915 08/23/15 0636 08/24/15 0854  WBC 11.3*  --  9.2  --   HGB 8.9*  --  8.2*  --   HCT 27.7*  --  25.6*  --   PLT 327  --  284  --   CREATININE 4.19*  --  4.14* 4.16*  MG  --  2.3  --   --   PHOS  --  5.6*  --   --   ALBUMIN 2.5*  --   --   --   PROT 6.6  --   --   --   AST 14*  --   --   --   ALT 18  --   --   --   ALKPHOS 65  --   --   --   BILITOT 0.5  --   --   --    Estimated Creatinine Clearance: 13.2 mL/min (by C-G formula based on Cr of 4.16).   Recent Labs  08/22/15 1823 08/22/15 1947  GLUCAP 97 87    Microbiology: Recent Results (from the past 720 hour(s))  MRSA PCR Screening     Status: None   Collection Time: 08/22/15  6:36 PM  Result Value Ref Range Status   MRSA by PCR NEGATIVE NEGATIVE Final    Comment:        The GeneXpert MRSA Assay (FDA approved for NASAL specimens only), is one component of a comprehensive MRSA colonization surveillance program. It is not intended to diagnose MRSA infection nor to guide or monitor treatment for MRSA infections.     Medications:  Scheduled:  . albumin human  25 g Intravenous Q12H  . apixaban  5 mg Oral BID  . calcitRIOL  0.25 mcg Oral Q M,W,F  . diltiazem  120 mg Oral Daily  .  ferrous sulfate  325 mg Oral Q breakfast  . letrozole  2.5 mg Oral Daily  . levothyroxine  25 mcg Oral QAC breakfast  . metoprolol  150 mg Oral BID  . potassium chloride  10 mEq Intravenous Q1 Hr x 4  . sodium chloride flush  3 mL Intravenous Q12H  . sodium chloride flush  3 mL Intravenous Q12H   Infusions:  . diltiazem (CARDIZEM) infusion Stopped (08/23/15 1201)  . furosemide (LASIX) infusion 8 mg/hr (08/24/15 0205)    Assessment: Pharmacy consulted for electrolyte monitoring and management for 81 yo female currently ordered furosemide drip 8mg /hr.   3/1: ALL electrolytes within normal limits EXCEPT K = 3.0   Plan:  Will give KCl 10 mEq IV x 4 doses and will order another K level to be ordered @ 17:00. Pharmacy to follow and supplement electrolytes as indicated by results.    Larene Beach, PharmD   08/24/2015 11:04 AM

## 2015-08-24 NOTE — Progress Notes (Signed)
A fib. Room air. Takes meds ok. Neg MRSA. Lasix gtt @ 8. IV was K was replaced. Pt refused PICC. Pt has no further concerns at this time.

## 2015-08-24 NOTE — Care Management Important Message (Signed)
Important Message  Patient Details  Name: Teresa Lutz MRN: [id number] Date of Birth: 08/04/1934   Medicare Important Message Given:  Yes    Juliann Pulse A Bunny Lowdermilk 08/24/2015, 10:29 AM

## 2015-08-24 NOTE — Progress Notes (Signed)
Supervisor called for second IV attempt.  Two sticks attempted with no success.  IV lasix drip stopped to administer IV albumin.  Will restart Lasix drip once albumin completed. Teresa Lutz

## 2015-08-24 NOTE — Progress Notes (Signed)
Patient Name: Teresa Lutz Date of Encounter: 08/24/2015  Hospital Problem List     Principal Problem:   Acute on chronic diastolic (congestive) heart failure (HCC) Active Problems:   End stage renal disease (HCC)   Chronic atrial fibrillation (HCC)   Atrial fibrillation with RVR (HCC)   Generalized edema   Hypoalbuminemia due to protein-calorie malnutrition (HCC)   Hypertensive heart disease   Hyperlipidemia   Anemia of chronic disease   Pressure ulcer   Subjective   She feels like breathing has improved some.  Just got back from the bathroom.  Was able to ambulate to and fro w/o too much trouble.  Inpatient Medications    . albumin human  25 g Intravenous Q12H  . apixaban  5 mg Oral BID  . calcitRIOL  0.25 mcg Oral Q M,W,F  . diltiazem  120 mg Oral Daily  . ferrous sulfate  325 mg Oral Q breakfast  . letrozole  2.5 mg Oral Daily  . levothyroxine  25 mcg Oral QAC breakfast  . metoprolol  150 mg Oral BID  . sodium chloride flush  3 mL Intravenous Q12H  . sodium chloride flush  3 mL Intravenous Q12H    Vital Signs    Filed Vitals:   08/23/15 2052 08/23/15 2123 08/24/15 0459 08/24/15 0738  BP: 141/80  130/74 140/80  Pulse: 83  91 85  Temp: 98 F (36.7 C)  98 F (36.7 C)   TempSrc: Oral  Oral   Resp: 20   20  Height:      Weight:  210 lb 9.6 oz (95.528 kg) 210 lb 12.8 oz (95.618 kg)   SpO2: 100%  100%     Intake/Output Summary (Last 24 hours) at 08/24/15 1041 Last data filed at 08/24/15 0734  Gross per 24 hour  Intake 629.55 ml  Output   1075 ml  Net -445.45 ml   Filed Weights   08/22/15 2000 08/23/15 2123 08/24/15 0459  Weight: 194 lb (87.998 kg) 210 lb 9.6 oz (95.528 kg) 210 lb 12.8 oz (95.618 kg)    Physical Exam    General: Pleasant, NAD. Neuro: Alert and oriented X 3. Moves all extremities spontaneously. Psych: Normal affect. HEENT:  Normal  Neck: Supple without bruits.  JVP to jaw. Lungs:  Resp regular and unlabored, diminished breath  sounds bilat bases. Heart: IR, IR no s3, s4, or murmurs. Abdomen: Semi-firm, protuberant, non-tender, BS + x 4.  Extremities: No clubbing, cyanosis.  Trace bilat UE edema with 2-3+ bilat, soft LE edema. DP/PT/Radials 1+ and equal bilaterally.  Labs    CBC  Recent Labs  08/22/15 1519 08/23/15 0636  WBC 11.3* 9.2  NEUTROABS 10.0*  --   HGB 8.9* 8.2*  HCT 27.7* 25.6*  MCV 88.8 88.0  PLT 327 [id number]   Basic Metabolic Panel  Recent Labs  08/22/15 1915 08/23/15 0636 08/23/15 1744 08/24/15 0854  NA  --  144  --  142  K  --  3.2* 4.8 3.0*  CL  --  107  --  102  CO2  --  25  --  26  GLUCOSE  --  135*  --  146*  BUN  --  89*  --  96*  CREATININE  --  4.14*  --  4.16*  CALCIUM  --  9.5  --  10.0  MG 2.3  --   --   --   PHOS 5.6*  --   --   --  Liver Function Tests  Recent Labs  08/22/15 1519  AST 14*  ALT 18  ALKPHOS 65  BILITOT 0.5  PROT 6.6  ALBUMIN 2.5*   Cardiac Enzymes  Recent Labs  08/22/15 1519  TROPONINI 0.08*   Fasting Lipid Panel  Recent Labs  08/24/15 0854  CHOL 133  HDL 34*  LDLCALC 65  TRIG 169*  CHOLHDL 3.9    Telemetry    AFib - 80's to 130's (mostly 80's to low 100's).  Radiology    US Renal  08/23/2015  CLINICAL DATA:  Acute renal failure. EXAM: RENAL / URINARY TRACT ULTRASOUND COMPLETE COMPARISON:  None. FINDINGS: Right Kidney: Length: 10.3 cm. Mild increased cortical echogenicity. No hydronephrosis. 1.8 cm cyst over the lower pole. Left Kidney: Length: 10.5 cm. Increased cortical echogenicity. No hydronephrosis. 4 cm cyst over the mid pole. Bladder: Appears normal for degree of bladder distention. IMPRESSION: Normal size kidneys without hydronephrosis. Mild increased cortical echogenicity compatible medical renal disease. Single bilateral renal cysts as described. Electronically Signed   By: Marin Olp M.D.   On: 08/23/2015 12:24   Dg Chest Port 1 View  08/22/2015  CLINICAL DATA:  BILATERAL lower extremity swelling since this  weekend, known CHF, dyspnea, breast cancer, chronic atrial fibrillation, pulmonary hypertension EXAM: PORTABLE CHEST 1 VIEW COMPARISON:  Portable exam 1533 hours compared to 08/01/2015 FINDINGS: Minimal enlargement of cardiac silhouette. Tortuosity of thoracic aorta. Pulmonary vascularity normal. Chronic RIGHT basilar atelectasis. Lungs otherwise clear. No pleural effusion or pneumothorax. Bones demineralized with degenerative disc disease changes thoracic spine and BILATERAL glenohumeral degenerative changes. IMPRESSION: Minimal enlargement of cardiac silhouette with chronic RIGHT basilar atelectasis. Electronically Signed   By: Lavonia Dana M.D.   On: 08/22/2015 15:50    Assessment & Plan    1. Acute on Chronic Diastolic CHF/Acute on chronic renal failure Presented with worsening lower extremity edema and mild orthopnea and dyspnea. Was seen by Dr. Fletcher Anon on 08/16/2015 and reported not taking her diuretic for over a week.   BNP 809 on admission. CXR showed minimal enlargement of the cardiac silhouette with chronic right basilar atelectasis. Weight 216 lbs on 08/16/2015. In 04/2015, she was 192 lbs.  Admission weight 210 lbs  210 lbs recorded again this AM. -Net negative diuresis of only 76.2 ml since admission though she says she just used the bathroom. -Dyspnea has improved some.   -Lasix gtt per nephrology. -Creat relatively stable.  2. Chronic atrial fibrillation Presented in atrial fibrillation with RVR, HR in the 140's. Likely secondary to her significant volume overload. Rates stable overnight. -Cont home dose of PO dilt/ blocker. -This patients CHA2DS2-VASc Score and unadjusted Ischemic Stroke Rate (% per year) is equal to 7.2 % stroke rate/year from a score of 5 (CHF, HTN, Female, Age (2)). Continue Eliquis for anticoagulation (on 5 BID).  3. Hypertensive Heart Disease - BP stable in 130's to 140's. - Cont  blocker/Dilt.  4. HLD - LDL 65  Not on statin.  5. OSA  - On CPAP  6.  Anemia of Chronic Kidney Disease  Relatively stable.   -Followed by heme/onc as outpt.  7. Hypokalemia K 3.0 this morning. - Supp.  8.  Elevated Troponin Likely 2/2 demand ischemia in the setting of CHF/AF RVR.  Renal failure Lutz also be playing a role. Low risk MV in 03/2015 (no ischemia). -Given renal failure and recent neg study, would not pursue further ischemic eval @ this time.  Signed, Murray Hodgkins NP

## 2015-08-24 NOTE — NC FL2 (Signed)
[place] LEVEL OF CARE SCREENING TOOL     IDENTIFICATION  Patient Name: Teresa Lutz Birthdate: [birth year] Sex: female Admission Date (Current Location): 08/22/2015  [place] and [place] Number:  Engineering geologist and Address:  [facility], [address] [place], [place] [address]      Provider Number: [id number]  Attending Physician Name and Address:  Lytle Butte, MD  Relative Name and Phone Number:       Current Level of Care: Hospital Recommended Level of Care: [place] Prior Approval Number:    Date Approved/Denied:   PASRR Number:   [id number]   Discharge Plan: SNF    Current Diagnoses: Patient Active Problem List   Diagnosis Date Noted  . Hypertensive heart disease 08/24/2015  . Pressure ulcer 08/23/2015  . Acute on chronic diastolic (congestive) heart failure ([place]) 08/23/2015  . End stage renal disease ([place])   . Generalized edema   . Hypoalbuminemia due to protein-calorie malnutrition ([place])   . Absolute anemia   . Atrial fibrillation with RVR ([place]) 08/22/2015  . CKD (chronic kidney disease), stage IV ([place])   . Hyperlipidemia   . Lower extremity edema   . Chronic atrial fibrillation ([place])   . Chronic diastolic CHF (congestive heart failure) ([place])   . Pulmonary HTN ([place])   . Anemia of chronic disease   . Obstructive sleep apnea 02/17/2015  . Breast cancer, invasive mammary. T1,N0,M0. ER/PR pos, Her 2 neg 02/08/2014  . Hypertension     Orientation RESPIRATION BLADDER Height & Weight     Self, Time, Situation, Place  Normal Continent Weight: 210 lb 12.8 oz (95.618 kg) Height:  5\' 7"  (170.2 cm)  BEHAVIORAL SYMPTOMS/MOOD NEUROLOGICAL BOWEL NUTRITION STATUS   (None)  (None) Continent Diet (renal with fluid restriction )  AMBULATORY STATUS COMMUNICATION OF NEEDS Skin   Extensive Assist Verbally Other (Comment) (Pressure Ulcer- Deep Tissue Injury Left Heel)                        Personal Care Assistance Level of Assistance  Bathing, Feeding, Dressing Bathing Assistance: Limited assistance Feeding assistance: Independent Dressing Assistance: Limited assistance     Functional Limitations Info  Sight, Hearing, Speech Sight Info: Adequate Hearing Info: Adequate Speech Info: Adequate    SPECIAL CARE FACTORS FREQUENCY  PT (By licensed PT)     PT Frequency:  (5)              Contractures     Additional Factors Info  Code Status, Allergies Code Status Info:  (Full Code) Allergies Info:  (Celebrex)           Current Medications (08/24/2015):  This is the current hospital active medication list Current Facility-Administered Medications  Medication Dose Route Frequency Provider Last Rate Last Dose  . 0.9 %  sodium chloride infusion  250 mL Intravenous PRN Demetrios Loll, MD 10 mL/hr at 08/23/15 1500 250 mL at 08/23/15 1500  . acetaminophen (TYLENOL) tablet 650 mg  650 mg Oral Q6H PRN Demetrios Loll, MD   650 mg at 08/22/15 2114   Or  . acetaminophen (TYLENOL) suppository 650 mg  650 mg Rectal Q6H PRN Demetrios Loll, MD      . albumin human 25 % solution 25 g  25 g Intravenous Q12H Munsoor Lateef, MD   25 g at 08/24/15 0918  . albuterol (PROVENTIL) (2.5 MG/3ML) 0.083% nebulizer solution 2.5 mg  2.5 mg Nebulization Q2H PRN Sheppard Evens  Bridgett Larsson, MD      . apixaban Arne [place]) tablet 5 mg  5 mg Oral BID Lytle Butte, MD   5 mg at 08/24/15 0919  . calcitRIOL (ROCALTROL) capsule 0.25 mcg  0.25 mcg Oral Q M,W,F Demetrios Loll, MD   0.25 mcg at 08/24/15 [phone]  . cefTRIAXone (ROCEPHIN) 1 g in dextrose 5 % 50 mL IVPB  1 g Intravenous Q24H Lytle Butte, MD   1 g at 08/24/15 1430  . diltiazem (CARDIZEM CD) 24 hr capsule 120 mg  120 mg Oral Daily Demetrios Loll, MD   120 mg at 08/24/15 0918  . diltiazem (CARDIZEM) 100 mg in dextrose 5 % 100 mL (1 mg/mL) infusion  5-15 mg/hr Intravenous Titrated Earleen Newport, MD   Stopped at 08/23/15 1201  . ferrous sulfate tablet 325 mg  325 mg Oral Q  breakfast Demetrios Loll, MD   325 mg at 08/24/15 0919  . furosemide (LASIX) 250 mg in dextrose 5 % 250 mL (1 mg/mL) infusion  8 mg/hr Intravenous Continuous Munsoor Lateef, MD 8 mL/hr at 08/24/15 0205 8 mg/hr at 08/24/15 0205  . letrozole [facility]) tablet 2.5 mg  2.5 mg Oral Daily Demetrios Loll, MD   2.5 mg at 08/24/15 0918  . levothyroxine (SYNTHROID, LEVOTHROID) tablet 25 mcg  25 mcg Oral QAC breakfast Demetrios Loll, MD   25 mcg at 08/24/15 0457  . metoprolol (LOPRESSOR) tablet 150 mg  150 mg Oral BID Demetrios Loll, MD   150 mg at 08/24/15 [id number]  . ondansetron (ZOFRAN) tablet 4 mg  4 mg Oral Q6H PRN Demetrios Loll, MD       Or  . ondansetron [facility]) injection 4 mg  4 mg Intravenous Q6H PRN Demetrios Loll, MD      . oxyCODONE-acetaminophen (PERCOCET/ROXICET) 5-325 MG per tablet 1 tablet  1 tablet Oral Q6H PRN Flora Lipps, MD   1 tablet at 08/23/15 1201  . potassium chloride SA (K-DUR,KLOR-CON) CR tablet 40 mEq  40 mEq Oral Once Lytle Butte, MD   40 mEq at 08/24/15 1515  . sodium chloride flush (NS) 0.9 % injection 3 mL  3 mL Intravenous Q12H Demetrios Loll, MD   3 mL at 08/24/15 1000  . sodium chloride flush (NS) 0.9 % injection 3 mL  3 mL Intravenous Q12H Demetrios Loll, MD   3 mL at 08/24/15 1000  . sodium chloride flush (NS) 0.9 % injection 3 mL  3 mL Intravenous PRN Demetrios Loll, MD         Discharge Medications: Please see discharge summary for a list of discharge medications.  Relevant Imaging Results:  Relevant Lab Results:   Additional Information   (SSN [id number])  Lorenso Quarry Shar Paez, LCSW

## 2015-08-24 NOTE — Clinical Social Work Note (Signed)
Clinical Social Work Assessment  Patient Details  Name: Teresa Lutz MRN: [id number] Date of Birth: 1934-07-07  Date of referral:  08/24/15               Reason for consult:  Discharge Planning                Permission sought to share information with:  Family Supports Permission granted to share information::  Yes, Verbal Permission Granted  Name::        Agency::     Relationship::   Adi Seales- Daughter [phone])  Contact Information:     Housing/Transportation Living arrangements for the past 2 months:  Single Family Home Source of Information:  Patient Patient Interpreter Needed:  None Criminal Activity/Legal Involvement Pertinent to Current Situation/Hospitalization:  No - Comment as needed Significant Relationships:  Adult Children, Other Family Members Aryahi Denzler- Daughter [phone]) Lives with:  Adult Children Do you feel safe going back to the place where you live?  Yes Need for family participation in patient care:  Yes (Comment) Shuntay Everetts- Daughter [phone])  Care giving concerns:  PT recommended SNF placement. Patient's daughter reports she's not able to care for patient at home. She stated she's interested in SNF placement and will discuss option with her mother.    Social Worker assessment / plan:  CSW was consulted by PT stating that patient needs SNF placement.  CSW met with patient's daughter in the hallway as she was leaving. CSW explained her role. Per patient's daughter she feels that patient would benefit for SNF placement. She stated that patient may need some "convincing". CSW confirmed patient's daughter number. CSW met with patient at bedside. CSW explained her role. Patient was alert and oriented. Patient reports that she would want to go home at discharge but would consider STR. She stated she wanted to discuss the option with her daughter and stated CSW can speak to her daughter about SNF placement tomorrow too. CSW informed patient that  her daughter had the SNF list. Verbal permission granted to send SNF referral to SNFs in [facility].   FL2/ PASRR completed and faxed to SNFs in [facility]. Awaiting bed offers. CSW will continue to follow and assist.   Employment status:  Retired Forensic scientist:  Medicare PT Recommendations:  [place] / Referral to community resources:  [place]  Patient/Family's Response to care:  Patient is undecided about going to SNF would like to discuss options with her daughter. Granted permission for CSW to send SNF referral.   Patient/Family's Understanding of and Emotional Response to Diagnosis, Current Treatment, and Prognosis:  Patient and her family are appreciative of CSW assistance and understand her role.   Emotional Assessment Appearance:  Appears older than stated age Attitude/Demeanor/Rapport:   (None) Affect (typically observed):  Calm, Pleasant Orientation:  Oriented to Self, Oriented to Place, Oriented to  Time, Oriented to Situation Alcohol / Substance use:  Not Applicable Psych involvement (Current and /or in the community):  No (Comment)  Discharge Needs  Concerns to be addressed:  Discharge Planning Concerns Readmission within the last 30 days:  No Current discharge risk:  Chronically ill Barriers to Discharge:  Continued Medical Work up   [employer], LCSW 08/24/2015, 4:14 PM

## 2015-08-24 NOTE — Evaluation (Signed)
Physical Therapy Evaluation Patient Details Name: Teresa Lutz MRN: [id number] DOB: 03-Apr-1935 Today's Date: 08/24/2015   History of Present Illness  Pt admitted for acute on chronic diastolic heart failure. Pt with complaints of leg swelling and tachycardia. Pt with history of ESRD and Afib. Per family report, history of recent falls.  Clinical Impression  Pt is a pleasant 81 year old female who was admitted for chronic diastolic heart failure. Pt performs bed mobility with mod assist and is unable to perform further mobility. Pt demonstrates deficits with strength/mobility. Pt with increased edema on B LE and she complains of pain with increased exertion. Would benefit from skilled PT to address above deficits and promote optimal return to PLOF; recommend transition to STR upon discharge from acute hospitalization.       Follow Up Recommendations SNF    Equipment Recommendations       Recommendations for Other Services       Precautions / Restrictions Precautions Precautions: Fall Restrictions Weight Bearing Restrictions: No      Mobility  Bed Mobility Overal bed mobility: Needs Assistance Bed Mobility: Supine to Sit     Supine to sit: Mod assist     General bed mobility comments: assist for scooting B LE off bed. Pt able to assist for trunk mobility. Once seated at EOB, pt able to sit with supervision  Transfers                 General transfer comment: unable to transfer at this time secondary weakness/pain  Ambulation/Gait             General Gait Details: unable at this time  Stairs            Wheelchair Mobility    Modified Rankin (Stroke Patients Only)       Balance Overall balance assessment: Needs assistance;History of Falls Sitting-balance support: Feet supported Sitting balance-Leahy Scale: Fair                                       Pertinent Vitals/Pain Pain Assessment: Faces Faces Pain Scale: Hurts even  more Pain Location: B LE-anterior espeically on knees Pain Descriptors / Indicators: Discomfort;Sore Pain Intervention(s): Limited activity within patient's tolerance    Home Living Family/patient expects to be discharged to:: Private residence Living Arrangements: Alone Available Help at Discharge: Family (daughter helps, however unable to provide 24/7 assist) Type of Home: House Home Access: Stairs to enter Entrance Stairs-Rails: None Entrance Stairs-Number of Steps: 2 Home Layout: One level Home Equipment: Cane - single point;Walker - 2 wheels;Wheelchair - manual Additional Comments: they would like a hospital bed    Prior Function Level of Independence: Needs assistance         Comments: has been requiring assist from daughter for bed mobility.     Hand Dominance        Extremity/Trunk Assessment   Upper Extremity Assessment: Generalized weakness (grossly 4/5)           Lower Extremity Assessment: Generalized weakness (grossly 2/5- unable to perform SLR)         Communication   Communication: No difficulties  Cognition Arousal/Alertness: Lethargic Behavior During Therapy: WFL for tasks assessed/performed Overall Cognitive Status: Within Functional Limits for tasks assessed                      General Comments  Exercises Other Exercises Other Exercises: supine ther-ex performed including B LE quad sets, SLRs, hip abd/add, ankle pumps, and SAQ. All ther-ex performed x 10 reps with mod assist      Assessment/Plan    PT Assessment Patient needs continued PT services  PT Diagnosis Difficulty walking;Generalized weakness;Abnormality of gait   PT Problem List Decreased strength;Decreased activity tolerance;Decreased balance;Decreased mobility;Decreased knowledge of use of DME;Pain  PT Treatment Interventions Gait training;Therapeutic exercise;DME instruction   PT Goals (Current goals can be found in the Care Plan section) Acute Rehab PT  Goals Patient Stated Goal: to get stronger PT Goal Formulation: With patient Time For Goal Achievement: 09/07/15 Potential to Achieve Goals: Good    Frequency Min 2X/week   Barriers to discharge Inaccessible home environment;Decreased caregiver support      Co-evaluation               End of Session   Activity Tolerance: Patient limited by pain;Patient limited by fatigue Patient left: in bed;with bed alarm set Nurse Communication: Mobility status         Time: [id number] PT Time Calculation (min) (ACUTE ONLY): 19 min   Charges:   PT Evaluation $PT Eval Moderate Complexity: 1 Procedure PT Treatments $Therapeutic Exercise: 8-22 mins   PT G Codes:        Jason Hauge Sep 20, 2015, 4:33 PM  Greggory Stallion, PT, DPT [phone]

## 2015-08-24 NOTE — Progress Notes (Signed)
[place] at [place] NAME: Kimmerly Mctague    MR#:  [id number]  DATE OF BIRTH:  [birth year]  SUBJECTIVE:  Complains of bilateral leg pain secondary to swelling Transfer out of ICU yesterday No further complaints this morning currently breakfast  REVIEW OF SYSTEMS:  CONSTITUTIONAL: No fever, fatigue or weakness.  EYES: No blurred or double vision.  EARS, NOSE, AND THROAT: No tinnitus or ear pain.  RESPIRATORY: No cough, shortness of breath, wheezing or hemoptysis.  CARDIOVASCULAR: No chest pain, positive orthopnea, positive edema.  GASTROINTESTINAL: No nausea, vomiting, diarrhea or abdominal pain.  GENITOURINARY: No dysuria, hematuria.  ENDOCRINE: No polyuria, nocturia,  HEMATOLOGY: No anemia, easy bruising or bleeding SKIN: No rash or lesion. MUSCULOSKELETAL: No joint pain or arthritis.   NEUROLOGIC: No tingling, numbness, weakness.  PSYCHIATRY: No anxiety or depression.   DRUG ALLERGIES:   Allergies  Allergen Reactions  . Celebrex [Celecoxib] Hives    VITALS:  Blood pressure 148/75, pulse 105, temperature 97.9 F (36.6 C), temperature source Oral, resp. rate 22, height 5\' 7"  (1.702 m), weight 95.618 kg (210 lb 12.8 oz), SpO2 99 %.  PHYSICAL EXAMINATION:  VITAL SIGNS: Filed Vitals:   08/24/15 0738 08/24/15 1117  BP: 140/80 148/75  Pulse: 85 105  Temp:  97.9 F (36.6 C)  Resp: 20 28   GENERAL:81 y.o.female currently in no acute distress.  HEAD: Normocephalic, atraumatic.  EYES: Pupils equal, round, reactive to light. Extraocular muscles intact. No scleral icterus.  MOUTH: Moist mucosal membrane. Dentition intact. No abscess noted.  EAR, NOSE, THROAT: Clear without exudates. No external lesions.  NECK: Supple. No thyromegaly. No nodules. No JVD.  PULMONARY: Diminished without wheeze rails or rhonci. No use of accessory muscles, Good respiratory effort. good air entry bilaterally CHEST: Nontender to palpation.   CARDIOVASCULAR: S1 and S2. Irregular rate and rhythm. No murmurs, rubs, or gallops. 3+ edema. Pedal pulses 2+ bilaterally.  GASTROINTESTINAL: Soft, nontender, nondistended. No masses. Positive bowel sounds. No hepatosplenomegaly.  MUSCULOSKELETAL: No swelling, clubbing, or edema. Range of motion full in all extremities.  NEUROLOGIC: Cranial nerves II through XII are intact. No gross focal neurological deficits. Sensation intact. Reflexes intact.  SKIN: No ulceration, lesions, rashes, or cyanosis. Skin warm and dry. Turgor intact.  PSYCHIATRIC: Mood, affect within normal limits. The patient is awake, alert and oriented x 3. Insight, judgment intact.      LABORATORY PANEL:   CBC  Recent Labs Lab 08/23/15 0636  WBC 9.2  HGB 8.2*  HCT 25.6*  PLT 284   ------------------------------------------------------------------------------------------------------------------  Chemistries   Recent Labs Lab 08/22/15 1519 08/22/15 1915  08/24/15 0854  NA 143  --   < > 142  K 3.7  --   < > 3.0*  CL 104  --   < > 102  CO2 24  --   < > 26  GLUCOSE 135*  --   < > 146*  BUN 85*  --   < > 96*  CREATININE 4.19*  --   < > 4.16*  CALCIUM 9.9  --   < > 10.0  MG  --  2.3  --   --   AST 14*  --   --   --   ALT 18  --   --   --   ALKPHOS 65  --   --   --   BILITOT 0.5  --   --   --   < > = values  in this interval not displayed. ------------------------------------------------------------------------------------------------------------------  Cardiac Enzymes  Recent Labs Lab 08/22/15 1519  TROPONINI 0.08*   ------------------------------------------------------------------------------------------------------------------  RADIOLOGY:  US Renal  08/23/2015  CLINICAL DATA:  Acute renal failure. EXAM: RENAL / URINARY TRACT ULTRASOUND COMPLETE COMPARISON:  None. FINDINGS: Right Kidney: Length: 10.3 cm. Mild increased cortical echogenicity. No hydronephrosis. 1.8 cm cyst over the lower pole.  Left Kidney: Length: 10.5 cm. Increased cortical echogenicity. No hydronephrosis. 4 cm cyst over the mid pole. Bladder: Appears normal for degree of bladder distention. IMPRESSION: Normal size kidneys without hydronephrosis. Mild increased cortical echogenicity compatible medical renal disease. Single bilateral renal cysts as described. Electronically Signed   By: Marin Olp M.D.   On: 08/23/2015 12:24   Dg Chest Port 1 View  08/22/2015  CLINICAL DATA:  BILATERAL lower extremity swelling since this weekend, known CHF, dyspnea, breast cancer, chronic atrial fibrillation, pulmonary hypertension EXAM: PORTABLE CHEST 1 VIEW COMPARISON:  Portable exam 1533 hours compared to 08/01/2015 FINDINGS: Minimal enlargement of cardiac silhouette. Tortuosity of thoracic aorta. Pulmonary vascularity normal. Chronic RIGHT basilar atelectasis. Lungs otherwise clear. No pleural effusion or pneumothorax. Bones demineralized with degenerative disc disease changes thoracic spine and BILATERAL glenohumeral degenerative changes. IMPRESSION: Minimal enlargement of cardiac silhouette with chronic RIGHT basilar atelectasis. Electronically Signed   By: Lavonia Dana M.D.   On: 08/22/2015 15:50    EKG:   Orders placed or performed during the hospital encounter of 08/22/15  . ED EKG  . ED EKG    ASSESSMENT AND PLAN:   81 year old African female history of atrial fibrillation, chronic kidney disease stage V, congestive heart failure ejection fraction 60-65 presenting with worsening edema  1. Acute on chronic diastolic congestive heart failure: Cardiology nephrology input appreciated, continue diuresis with IV Lasix for urine output renal function, DuoNeb treatments as required, oxygen as required 2. Atrial fibrillation rapid ventricular response: Cardizem, stabilized 3. Acute kidney injury on chronic kidney disease: Continue with diuresis hopefully increase cardiac output will have improvement of renal function otherwise may  require dialysis 4. Hypokalemia: Replace goal 4-5 5. Urinary tract infection site unspecified: Start ceftriaxone follow culture data 6. Hypothyroidism unspecified: Synthroid 7. Hypertension essential: Lopressor, Benicar 8. Venous thromboembolism prophylactic: Eliquis therapeutic  Disposition:  physical therapy evaluation   All the records are reviewed and case discussed with Care Management/Social Workerr. Management plans discussed with the patient, family and they are in agreement.  CODE STATUS: Full  TOTAL TIME TAKING CARE OF THIS PATIENT: 33 minutes.   POSSIBLE D/C IN 2-3 DAYS, DEPENDING ON CLINICAL CONDITION.   Hower,  Karenann Cai.D on 08/24/2015 at 3:03 PM  Between 7am to 6pm - Pager - [phone]  After 6pm: House Pager: [phone]  [facility]  Office  [phone]  CC: Primary care physician; Casilda Carls, MD

## 2015-08-24 NOTE — Progress Notes (Signed)
Pt was educated on need for IJ due to limited IV access. Pt refused. Per MD Hower ok to stop lasix gtt as needed to run another medication.

## 2015-08-24 NOTE — Progress Notes (Signed)
[facility] NOTE  Pharmacy Consult for electrolyte monitoring  Indication: furosemide drip    Allergies  Allergen Reactions  . Celebrex [Celecoxib] Hives   Patient Measurements: Height: 5\' 7"  (170.2 cm) Weight: 210 lb 12.8 oz (95.618 kg) IBW/kg (Calculated) : 61.6  Vital Signs: Temp: 97.9 F (36.6 C) (03/01 1117) BP: 148/75 mmHg (03/01 1117) Pulse Rate: 105 (03/01 1117) Intake/Output from previous day: 02/28 0701 - 03/01 0700 In: 872.6 [P.O.:360; I.V.:212.6; IV Piggyback:300] Out: 1075 [Urine:1075] Intake/Output from this shift: Total I/O In: 740 [P.O.:240; IV Piggyback:500] Out: 750 [Urine:750] Vent settings for last 24 hours:    Labs:  Recent Labs  08/22/15 1519 08/22/15 1915 08/23/15 0636 08/24/15 0854  WBC 11.3*  --  9.2  --   HGB 8.9*  --  8.2*  --   HCT 27.7*  --  25.6*  --   PLT 327  --  284  --   CREATININE 4.19*  --  4.14* 4.16*  MG  --  2.3  --   --   PHOS  --  5.6*  --   --   ALBUMIN 2.5*  --   --   --   PROT 6.6  --   --   --   AST 14*  --   --   --   ALT 18  --   --   --   ALKPHOS 65  --   --   --   BILITOT 0.5  --   --   --    Estimated Creatinine Clearance: 13.2 mL/min (by C-G formula based on Cr of 4.16).   Recent Labs  08/22/15 1823 08/22/15 1947  GLUCAP 97 87    Assessment: Pharmacy consulted for electrolyte monitoring and management for 81 yo female currently ordered furosemide drip 8mg /hr.   Potassium of 3.7 is within normal limits after replacement with KCl 40 mEq IV earlier today.   Plan:  No additional supplementation needed at this time K/Mg/Phos ordered with AM labs tomorrow  Pharmacy will continue to monitor, thank you for the consult.   Lenis Noon, PharmD  Clinical Pharmacist 08/24/2015 5:58 PM

## 2015-08-24 NOTE — Progress Notes (Signed)
Patient: Teresa Lutz / Admit Date: 08/22/2015 / Date of Encounter: 08/24/2015, 8:32 AM   Subjective:   Review of Systems: ROS All other systems reviewed and negative.   Objective: Telemetry:  Physical Exam: Blood pressure 140/80, pulse 85, temperature 98 F (36.7 C), temperature source Oral, resp. rate 20, height 5\' 7"  (1.702 m), weight 210 lb 12.8 oz (95.618 kg), SpO2 100 %. Body mass index is 33.01 kg/(m^2). General: Well developed, well nourished, in no acute distress. Head: Normocephalic, atraumatic, sclera non-icteric, no xanthomas, nares are without discharge. Neck: Negative for carotid bruits. JVP not elevated. Lungs: Clear bilaterally to auscultation without wheezes, rales, or rhonchi. Breathing is unlabored. Heart: RRR S1 S2 without murmurs, rubs, or gallops.  Abdomen: Soft, non-tender, non-distended with normoactive bowel sounds. No rebound/guarding. Extremities: No clubbing or cyanosis. No edema. Distal pedal pulses are 2+ and equal bilaterally. Neuro: Alert and oriented X 3. Moves all extremities spontaneously. Psych:  Responds to questions appropriately with a normal affect.   Intake/Output Summary (Last 24 hours) at 08/24/15 0832 Last data filed at 08/24/15 0459  Gross per 24 hour  Intake 612.55 ml  Output   1075 ml  Net -462.45 ml    Inpatient Medications:  . albumin human  25 g Intravenous Q12H  . apixaban  5 mg Oral BID  . calcitRIOL  0.25 mcg Oral Q M,W,F  . diltiazem  120 mg Oral Daily  . ferrous sulfate  325 mg Oral Q breakfast  . letrozole  2.5 mg Oral Daily  . levothyroxine  25 mcg Oral QAC breakfast  . metoprolol  150 mg Oral BID  . sodium chloride flush  3 mL Intravenous Q12H  . sodium chloride flush  3 mL Intravenous Q12H   Infusions:  . diltiazem (CARDIZEM) infusion Stopped (08/23/15 1201)  . furosemide (LASIX) infusion 8 mg/hr (08/24/15 0205)    Labs:  Recent Labs  08/22/15 1519 08/22/15 1915 08/23/15 0636 08/23/15 1744  NA  143  --  144  --   K 3.7  --  3.2* 4.8  CL 104  --  107  --   CO2 24  --  25  --   GLUCOSE 135*  --  135*  --   BUN 85*  --  89*  --   CREATININE 4.19*  --  4.14*  --   CALCIUM 9.9  --  9.5  --   MG  --  2.3  --   --   PHOS  --  5.6*  --   --     Recent Labs  08/22/15 1519  AST 14*  ALT 18  ALKPHOS 65  BILITOT 0.5  PROT 6.6  ALBUMIN 2.5*    Recent Labs  08/22/15 1519 08/23/15 0636  WBC 11.3* 9.2  NEUTROABS 10.0*  --   HGB 8.9* 8.2*  HCT 27.7* 25.6*  MCV 88.8 88.0  PLT 327 284    Recent Labs  08/22/15 1519  TROPONINI 0.08*   Invalid input(s): POCBNP No results for input(s): HGBA1C in the last 72 hours.   Weights: Filed Weights   08/22/15 2000 08/23/15 2123 08/24/15 0459  Weight: 194 lb (87.998 kg) 210 lb 9.6 oz (95.528 kg) 210 lb 12.8 oz (95.618 kg)     Radiology/Studies:  Dg Chest 2 View  08/01/2015  CLINICAL DATA:  Atrial fibrillation.  Diabetes . EXAM: CHEST  2 VIEW COMPARISON:  01/23/2008. FINDINGS: Mediastinum hilar structures normal. Right base subsegmental atelectasis and/or scarring. No pleural  effusion pneumothorax. Cardiomegaly. No acute bony abnormality. IMPRESSION: Right base subsegmental atelectasis and/or scarring. Electronically Signed   By: Marcello Moores  Register   On: 08/01/2015 11:44   US Renal  08/23/2015  CLINICAL DATA:  Acute renal failure. EXAM: RENAL / URINARY TRACT ULTRASOUND COMPLETE COMPARISON:  None. FINDINGS: Right Kidney: Length: 10.3 cm. Mild increased cortical echogenicity. No hydronephrosis. 1.8 cm cyst over the lower pole. Left Kidney: Length: 10.5 cm. Increased cortical echogenicity. No hydronephrosis. 4 cm cyst over the mid pole. Bladder: Appears normal for degree of bladder distention. IMPRESSION: Normal size kidneys without hydronephrosis. Mild increased cortical echogenicity compatible medical renal disease. Single bilateral renal cysts as described. Electronically Signed   By: Marin Olp M.D.   On: 08/23/2015 12:24   Dg Chest  Port 1 View  08/22/2015  CLINICAL DATA:  BILATERAL lower extremity swelling since this weekend, known CHF, dyspnea, breast cancer, chronic atrial fibrillation, pulmonary hypertension EXAM: PORTABLE CHEST 1 VIEW COMPARISON:  Portable exam 1533 hours compared to 08/01/2015 FINDINGS: Minimal enlargement of cardiac silhouette. Tortuosity of thoracic aorta. Pulmonary vascularity normal. Chronic RIGHT basilar atelectasis. Lungs otherwise clear. No pleural effusion or pneumothorax. Bones demineralized with degenerative disc disease changes thoracic spine and BILATERAL glenohumeral degenerative changes. IMPRESSION: Minimal enlargement of cardiac silhouette with chronic RIGHT basilar atelectasis. Electronically Signed   By: Lavonia Dana M.D.   On: 08/22/2015 15:50     Assessment and Plan  81 y.o. female  Pleasant 81 year old woman, known to our clinic, Dr. Fletcher Anon, recently seen approximately 10 days ago for acute on chronic diastolic CHF, chronic atrial fibrillation. Note indicating some medication noncompliance. -It was recommended that time that she go back on her torsemide for clinical exam consistent with acute on chronic diastolic CHF.   She presented to the hospital with worsening shortness of breath, tachycardia, foot swelling Noted to be in atrial fibrillation with RVR, heart rate 140s She was started on Cardizem infusion, Lasix infusion  She reports difficulty walking secondary to leg swelling. Recent evaluation for AV graft with "imaging" per the patient, currently with no access Worsening renal function over the past 6 months, creatinine 4.19   A/P --Atrial fibrillation Rate 90 to 110 eating  Agree with holding the infusion, changing to diltiazem by mouth, outpatient dose We'll monitor heart rate, continue anticoagulation  --Acute on chronic diastolic CHF Multifactorial, worsening renal function, anemia, low albumin Agree with Lasix infusion, may need to increase dose or add metolazone if  no significant urine output. Potentially could try Lasix boluses if needed  --Anemia Will defer to renal, likely needs epo, Anemia would explain some of her leg swelling  --Hypo albumin We'll contribute to her lower extremity swelling  --Renal failure May need port placed for temporary dialysis if no improvement in her clinical picture with Lasix infusion   Signed, Esmond Plants, MD, Ph.D. [facility] 08/24/2015, 8:32 AM

## 2015-08-25 LAB — BASIC METABOLIC PANEL
Anion gap: 14 (ref 5–15)
BUN: 97 mg/dL — AB (ref 6–20)
CALCIUM: 10.2 mg/dL (ref 8.9–10.3)
CO2: 26 mmol/L (ref 22–32)
Chloride: 101 mmol/L (ref 101–111)
Creatinine, Ser: 4.33 mg/dL — ABNORMAL HIGH (ref 0.44–1.00)
GFR calc Af Amer: 10 mL/min — ABNORMAL LOW (ref >60)
GFR, EST NON AFRICAN AMERICAN: 9 mL/min — AB (ref >60)
GLUCOSE: 134 mg/dL — AB (ref 65–99)
Potassium: 3.1 mmol/L — ABNORMAL LOW (ref 3.5–5.1)
Sodium: 141 mmol/L (ref 135–145)

## 2015-08-25 LAB — CBC
HCT: 22.6 % — ABNORMAL LOW (ref 35.0–47.0)
Hemoglobin: 7.2 g/dL — ABNORMAL LOW (ref 12.0–16.0)
MCH: 28 pg (ref 26.0–34.0)
MCHC: 32.1 g/dL (ref 32.0–36.0)
MCV: 87.4 fL (ref 80.0–100.0)
PLATELETS: 267 10*3/uL (ref 150–440)
RBC: 2.58 MIL/uL — ABNORMAL LOW (ref 3.80–5.20)
RDW: 16.6 % — AB (ref 11.5–14.5)
WBC: 11.9 10*3/uL — AB (ref 3.6–11.0)

## 2015-08-25 LAB — MAGNESIUM: Magnesium: 2.1 mg/dL (ref 1.7–2.4)

## 2015-08-25 LAB — PHOSPHORUS: Phosphorus: 4.8 mg/dL — ABNORMAL HIGH (ref 2.5–4.6)

## 2015-08-25 MED ORDER — POTASSIUM CHLORIDE CRYS ER 20 MEQ PO TBCR
40.0000 meq | EXTENDED_RELEASE_TABLET | Freq: Two times a day (BID) | ORAL | Status: AC
  Administered 2015-08-25 (×2): 40 meq via ORAL
  Filled 2015-08-25 (×2): qty 2

## 2015-08-25 NOTE — Progress Notes (Addendum)
Patient seemed to be more lethargic compared to baseline during assessment. Dr. Lavetta Nielsen notified. Patient currently stable, vss, and answers all questions appropriately. Also notified of hgb drop (7.2) [employer]

## 2015-08-25 NOTE — Progress Notes (Signed)
[place] at [place] NAME: Teresa Lutz    MR#:  [id number]  DATE OF BIRTH:  Oct 23, 1934  SUBJECTIVE:  Seems more drowsy/lethargic today  REVIEW OF SYSTEMS:  CONSTITUTIONAL: No fever, fatigue or weakness.  EYES: No blurred or double vision.  EARS, NOSE, AND THROAT: No tinnitus or ear pain.  RESPIRATORY: No cough, shortness of breath, wheezing or hemoptysis.  CARDIOVASCULAR: No chest pain, positive orthopnea, positive edema.  GASTROINTESTINAL: No nausea, vomiting, diarrhea or abdominal pain.  GENITOURINARY: No dysuria, hematuria.  ENDOCRINE: No polyuria, nocturia,  HEMATOLOGY: No anemia, easy bruising or bleeding SKIN: No rash or lesion. MUSCULOSKELETAL: No joint pain or arthritis.   NEUROLOGIC: No tingling, numbness, weakness.  PSYCHIATRY: No anxiety or depression.   DRUG ALLERGIES:   Allergies  Allergen Reactions  . Celebrex [Celecoxib] Hives    VITALS:  Blood pressure 128/61, pulse 91, temperature 98.6 F (37 C), temperature source Oral, resp. rate 18, height 5\' 7"  (1.702 m), weight 210 lb 11.2 oz (95.573 kg), SpO2 100 %.  PHYSICAL EXAMINATION:  VITAL SIGNS: Filed Vitals:   08/25/15 0933 08/25/15 1218  BP: 122/57 128/61  Pulse: 91 91  Temp:  98.6 F (37 C)  Resp:  57   GENERAL:80 y.o.female currently in no acute distress.  HEAD: Normocephalic, atraumatic.  EYES: Pupils equal, round, reactive to light. Extraocular muscles intact. No scleral icterus.  MOUTH: Moist mucosal membrane. Dentition intact. No abscess noted.  EAR, NOSE, THROAT: Clear without exudates. No external lesions.  NECK: Supple. No thyromegaly. No nodules. No JVD.  PULMONARY: Diminished without wheeze rails or rhonci. No use of accessory muscles, Good respiratory effort. good air entry bilaterally CHEST: Nontender to palpation.  CARDIOVASCULAR: S1 and S2. Irregular rate and rhythm. No murmurs, rubs, or gallops. 3+ edema. Pedal pulses 2+  bilaterally.  GASTROINTESTINAL: Soft, nontender, nondistended. No masses. Positive bowel sounds. No hepatosplenomegaly.  MUSCULOSKELETAL: No swelling, clubbing, or edema. Range of motion full in all extremities.  NEUROLOGIC: Cranial nerves II through XII are intact. No gross focal neurological deficits. Sensation intact. Reflexes intact.  SKIN: No ulceration, lesions, rashes, or cyanosis. Skin warm and dry. Turgor intact.  PSYCHIATRIC: Mood, affect blunted, sleeping but arousable   LABORATORY PANEL:   CBC  Recent Labs Lab 08/25/15 0609  WBC 11.9*  HGB 7.2*  HCT 22.6*  PLT 267   ------------------------------------------------------------------------------------------------------------------  Chemistries   Recent Labs Lab 08/22/15 1519  08/25/15 0609  NA 143  < > 141  K 3.7  < > 3.1*  CL 104  < > 101  CO2 24  < > 26  GLUCOSE 135*  < > 134*  BUN 85*  < > 97*  CREATININE 4.19*  < > 4.33*  CALCIUM 9.9  < > 10.2  MG  --   < > 2.1  AST 14*  --   --   ALT 18  --   --   ALKPHOS 65  --   --   BILITOT 0.5  --   --   < > = values in this interval not displayed. ------------------------------------------------------------------------------------------------------------------  Cardiac Enzymes  Recent Labs Lab 08/22/15 1519  TROPONINI 0.08*   ------------------------------------------------------------------------------------------------------------------  RADIOLOGY:  No results found.  EKG:   Orders placed or performed during the hospital encounter of 08/22/15  . ED EKG  . ED EKG    ASSESSMENT AND PLAN:   81 year old African female history of atrial fibrillation, chronic kidney disease stage V, congestive heart  failure ejection fraction 60-65 presenting with worsening edema  1. Acute on chronic diastolic congestive heart failure: Cardiology nephrology input appreciated, continue diuresis with IV Lasix for urine output renal function, DuoNeb treatments as  required, oxygen as required 2. Atrial fibrillation rapid ventricular response: Cardizem, stabilized 3. Acute kidney injury on chronic kidney disease: Slight worsening renal function I'm concerned mental status may be related to uremia will continue to monitor may require hemodialysis 4. Hypokalemia: Replace goal 4-5 5. Urinary tract infection site unspecified: Start ceftriaxone follow culture data 6. Hypothyroidism unspecified: Synthroid 7. Hypertension essential: Lopressor, Benicar 8. Venous thromboembolism prophylactic: Eliquis therapeutic  Disposition:  physical therapy evaluation   All the records are reviewed and case discussed with Care Management/Social Workerr. Management plans discussed with the patient, family and they are in agreement.  CODE STATUS: Full  TOTAL TIME TAKING CARE OF THIS PATIENT: 69minutes.   POSSIBLE D/C IN 2-3 DAYS, DEPENDING ON CLINICAL CONDITION.   Hower,  Karenann Cai.D on 08/25/2015 at 3:32 PM  Between 7am to 6pm - Pager - [phone]  After 6pm: House Pager: - [phone]  [facility]  Office  [phone]  CC: Primary care physician; Casilda Carls, MD

## 2015-08-25 NOTE — Progress Notes (Signed)
[facility] NOTE  Pharmacy Consult for electrolyte monitoring  Indication: furosemide drip    Allergies  Allergen Reactions  . Celebrex [Celecoxib] Hives   Patient Measurements: Height: 5\' 7"  (170.2 cm) Weight: 210 lb 11.2 oz (95.573 kg) IBW/kg (Calculated) : 61.6  Vital Signs: Temp: 98 F (36.7 C) (03/02 0432) BP: 122/57 mmHg (03/02 0933) Pulse Rate: 91 (03/02 0933) Intake/Output from previous day: 03/01 0701 - 03/02 0700 In: 890 [P.O.:240; IV Piggyback:650] Out: 900 [Urine:900] Intake/Output from this shift: Total I/O In: 240 [P.O.:240] Out: 300 [Urine:300] Vent settings for last 24 hours:    Labs:  Recent Labs  08/22/15 1519 08/22/15 1915 08/23/15 0636 08/24/15 0854 08/25/15 0609  WBC 11.3*  --  9.2  --  11.9*  HGB 8.9*  --  8.2*  --  7.2*  HCT 27.7*  --  25.6*  --  22.6*  PLT 327  --  284  --  267  CREATININE 4.19*  --  4.14* 4.16* 4.33*  MG  --  2.3  --   --  2.1  PHOS  --  5.6*  --   --  4.8*  ALBUMIN 2.5*  --   --   --   --   PROT 6.6  --   --   --   --   AST 14*  --   --   --   --   ALT 18  --   --   --   --   ALKPHOS 65  --   --   --   --   BILITOT 0.5  --   --   --   --    Estimated Creatinine Clearance: 12.7 mL/min (by C-G formula based on Cr of 4.33).   Recent Labs  08/22/15 1823 08/22/15 1947  GLUCAP 97 87    Assessment: Pharmacy consulted for electrolyte monitoring and management for 81 yo female currently ordered furosemide drip 8mg /hr.   Potassium of 3.1 is low today. Mag WNL, Phos slightly elevated.  Plan:  Will supplement with KCl 88mEq PO BID for today. Mag 2.1 (WNL), Phos 4.8 (elevated, but decreased since last level).  K/Mg/Phos ordered with AM labs tomorrow  Pharmacy will continue to monitor, thank you for the consult.  Roe Coombs, PharmD Pharmacy Resident 08/25/2015

## 2015-08-25 NOTE — Progress Notes (Signed)
CSW met with family at bedside. CSW presented bed offers. Requested CSW to pursue [facility] and [facility]. CSW called Magda Paganini- admissions assistant at [facility] and Maudie Mercury- admissions coordinator at [facility] and they reported they will review patient's referral. CSW will continue to follow and assist.   Ernest Pine, MSW, [place] Work Department [phone]

## 2015-08-25 NOTE — Progress Notes (Signed)
Initial appointment made at the [facility] on September 05, 2015 at 12:30pm. Thank you.

## 2015-08-25 NOTE — Progress Notes (Signed)
[facility]  ROUNDING NOTE   Subjective:  Urine output recorded as 900 cc. Patient continues on Lasix drip. Potassium currently low at 3.1. Still has considerable lower extremity edema. Renal function not significantly changed with a creatinine of 4.3, but worsening.   Objective:  Vital signs in last 24 hours:  Temp:  [98 F (36.7 C)-98.7 F (37.1 C)] 98.6 F (37 C) (03/02 1218) Pulse Rate:  [83-107] 91 (03/02 1218) Resp:  [18-20] 18 (03/02 1218) BP: (122-173)/(57-87) 128/61 mmHg (03/02 1218) SpO2:  [99 %-100 %] 100 % (03/02 1218) Weight:  [95.573 kg (210 lb 11.2 oz)] 95.573 kg (210 lb 11.2 oz) (03/02 0432)  Weight change: 0.045 kg (1.6 oz) Filed Weights   08/23/15 2123 08/24/15 0459 08/25/15 0432  Weight: 95.528 kg (210 lb 9.6 oz) 95.618 kg (210 lb 12.8 oz) 95.573 kg (210 lb 11.2 oz)    Intake/Output: I/O last 3 completed shifts: In: 1042.1 [P.O.:240; I.V.:52.1; IV Piggyback:750] Out: 8309 [Urine:1650]   Intake/Output this shift:  Total I/O In: 240 [P.O.:240] Out: 600 [Urine:600]  Physical Exam: General: NAD, resting in bed  Head: Normocephalic, atraumatic. Moist oral mucosal membranes  Eyes: Anicteric   Neck: Supple, trachea midline  Lungs:  basiilar rales, normal effort  Heart: S1S2 irregular  Abdomen:  Soft, nontender, BS [resemt   Extremities: 3+ peripheral edema.  Neurologic: Nonfocal, moving all four extremities. Lethargic ans sleepy today  Skin: No lesions       Basic Metabolic Panel:  Recent Labs Lab 08/22/15 1519 08/22/15 1915 08/23/15 0636 08/23/15 1744 08/24/15 0854 08/24/15 1657 08/25/15 0609  NA 143  --  144  --  142  --  141  K 3.7  --  3.2* 4.8 3.0* 3.7 3.1*  CL 104  --  107  --  102  --  101  CO2 24  --  25  --  26  --  26  GLUCOSE 135*  --  135*  --  146*  --  134*  BUN 85*  --  89*  --  96*  --  97*  CREATININE 4.19*  --  4.14*  --  4.16*  --  4.33*  CALCIUM 9.9  --  9.5  --  10.0  --  10.2  MG  --  2.3  --   --    --   --  2.1  PHOS  --  5.6*  --   --   --   --  4.8*    Liver Function Tests:  Recent Labs Lab 08/22/15 1519  AST 14*  ALT 18  ALKPHOS 65  BILITOT 0.5  PROT 6.6  ALBUMIN 2.5*   No results for input(s): LIPASE, AMYLASE in the last 168 hours. No results for input(s): AMMONIA in the last 168 hours.  CBC:  Recent Labs Lab 08/22/15 1519 08/23/15 0636 08/25/15 0609  WBC 11.3* 9.2 11.9*  NEUTROABS 10.0*  --   --   HGB 8.9* 8.2* 7.2*  HCT 27.7* 25.6* 22.6*  MCV 88.8 88.0 87.4  PLT 327 284 267    Cardiac Enzymes:  Recent Labs Lab 08/22/15 1519  TROPONINI 0.08*    BNP: Invalid input(s): POCBNP  CBG:  Recent Labs Lab 08/22/15 1823 08/22/15 1947  GLUCAP 97 87    Microbiology: Results for orders placed or performed during the hospital encounter of 08/22/15  MRSA PCR Screening     Status: None   Collection Time: 08/22/15  6:36 PM  Result Value  Ref Range Status   MRSA by PCR NEGATIVE NEGATIVE Final    Comment:        The GeneXpert MRSA Assay (FDA approved for NASAL specimens only), is one component of a comprehensive MRSA colonization surveillance program. It is not intended to diagnose MRSA infection nor to guide or monitor treatment for MRSA infections.   Urine culture     Status: None (Preliminary result)   Collection Time: 08/24/15 11:18 AM  Result Value Ref Range Status   Specimen Description URINE, RANDOM  Final   Special Requests NONE  Final   Culture HOLDING FOR POSSIBLE PATHOGEN  Final   Report Status PENDING  Incomplete    Coagulation Studies: No results for input(s): LABPROT, INR in the last 72 hours.  Urinalysis:  Recent Labs  08/24/15 1118  COLORURINE AMBER*  LABSPEC 1.008  PHURINE 5.0  GLUCOSEU NEGATIVE  HGBUR 1+*  BILIRUBINUR NEGATIVE  KETONESUR NEGATIVE  PROTEINUR 30*  NITRITE POSITIVE*  LEUKOCYTESUR 3+*      Imaging: No results found.   Medications:   . diltiazem (CARDIZEM) infusion Stopped (08/23/15 1201)   . furosemide (LASIX) infusion 8 mg/hr (08/24/15 0205)   . albumin human  25 g Intravenous Q12H  . apixaban  5 mg Oral BID  . calcitRIOL  0.25 mcg Oral Q M,W,F  . cefTRIAXone (ROCEPHIN)  IV  1 g Intravenous Q24H  . diltiazem  120 mg Oral Daily  . ferrous sulfate  325 mg Oral Q breakfast  . letrozole  2.5 mg Oral Daily  . levothyroxine  25 mcg Oral QAC breakfast  . metoprolol  150 mg Oral BID  . potassium chloride  40 mEq Oral BID  . sodium chloride flush  3 mL Intravenous Q12H  . sodium chloride flush  3 mL Intravenous Q12H   sodium chloride, acetaminophen **OR** acetaminophen, albuterol, ondansetron **OR** ondansetron (ZOFRAN) IV, oxyCODONE-acetaminophen, sodium chloride flush  Assessment/ Plan:  81 y.o. female with atrial fibrillation, anemia, coronary artery disease, diabetes mellitus type 2, hypertension, hyperlipidemia, history of breast cancer, CKD stage IV/V who presents with increasing shortness of breath and edema.  1. Acute renal failure/chronic kidney disease stage IV/5.  Baseline EGFR 18 however EGFR has been lower than this in the past.  Patient has worsening renal function now. Unclear if this is progression to end-stage renal disease versus a true episode of acute renal failure.  -  Renal function not significantly changed. Creatinine remains high at 4.3. She may actually have end-stage renal disease. We will continue with Lasix drip for 1 additional day. However if renal function remains low like this she will likely need to convert to hemodialysis.  2. Lower extremity edema. Remains 3+ on exam today. Continue Lasix drip for now   3. Anemia of chronic kidney disease. Patient was followed by Dr. Grayland Ormond as an outpatient. Hemoglobin currently low at 7.2.  As she will be transitioning most likely to hemodialysis we would recommend initiating Epogen with dialysis.  4. Secondary hyperparathyroidism. Continue the patient calcitriol 0.25 g by mouth 3 times a week.  5.  Hypokalemia.  Potassium remains low at 3.1. Agree with administering potassium chloride prn    LOS: 3 Jaydn Moscato 3/2/20174:52 PM

## 2015-08-26 DIAGNOSIS — I4891 Unspecified atrial fibrillation: Secondary | ICD-10-CM

## 2015-08-26 LAB — BASIC METABOLIC PANEL
Anion gap: 11 (ref 5–15)
BUN: 109 mg/dL — AB (ref 6–20)
CHLORIDE: 102 mmol/L (ref 101–111)
CO2: 27 mmol/L (ref 22–32)
Calcium: 10.2 mg/dL (ref 8.9–10.3)
Creatinine, Ser: 4.41 mg/dL — ABNORMAL HIGH (ref 0.44–1.00)
GFR calc Af Amer: 10 mL/min — ABNORMAL LOW (ref >60)
GFR calc non Af Amer: 9 mL/min — ABNORMAL LOW (ref >60)
GLUCOSE: 146 mg/dL — AB (ref 65–99)
POTASSIUM: 3.4 mmol/L — AB (ref 3.5–5.1)
Sodium: 140 mmol/L (ref 135–145)

## 2015-08-26 LAB — POTASSIUM: Potassium: 3.4 mmol/L — ABNORMAL LOW (ref 3.5–5.1)

## 2015-08-26 LAB — MAGNESIUM: MAGNESIUM: 2.2 mg/dL (ref 1.7–2.4)

## 2015-08-26 MED ORDER — POTASSIUM CHLORIDE CRYS ER 20 MEQ PO TBCR
40.0000 meq | EXTENDED_RELEASE_TABLET | Freq: Once | ORAL | Status: AC
  Administered 2015-08-26: 40 meq via ORAL
  Filled 2015-08-26: qty 2

## 2015-08-26 NOTE — Care Management Important Message (Signed)
Important Message  Patient Details  Name: Manar Funches MRN: [id number] Date of Birth: 05-19-1935   Medicare Important Message Given:  Yes    Juliann Pulse A Tiernan Suto 08/26/2015, 10:06 AM

## 2015-08-26 NOTE — Progress Notes (Signed)
[facility] NOTE  Pharmacy Consult for electrolyte monitoring  Indication: furosemide drip    Allergies  Allergen Reactions  . Celebrex [Celecoxib] Hives   Patient Measurements: Height: 5\' 7"  (170.2 cm) Weight: 211 lb 6.4 oz (95.89 kg) IBW/kg (Calculated) : 61.6  Vital Signs: Temp: 97.8 F (36.6 C) (03/03 0548) Temp Source: Oral (03/03 0548) BP: 127/69 mmHg (03/03 0927) Pulse Rate: 90 (03/03 0927) Intake/Output from previous day: 03/02 0701 - 03/03 0700 In: 481.6 [P.O.:240; I.V.:91.6; IV Piggyback:150] Out: 975 [Urine:975] Intake/Output from this shift: Total I/O In: 240 [P.O.:240] Out: -  Vent settings for last 24 hours:    Labs:  Recent Labs  08/24/15 0854 08/25/15 0609 08/26/15 0441  WBC  --  11.9*  --   HGB  --  7.2*  --   HCT  --  22.6*  --   PLT  --  267  --   CREATININE 4.16* 4.33* 4.41*  MG  --  2.1 2.2  PHOS  --  4.8*  --    Estimated Creatinine Clearance: 12.5 mL/min (by C-G formula based on Cr of 4.41).  No results for input(s): GLUCAP in the last 72 hours.  Assessment: Pharmacy consulted for electrolyte monitoring and management for 81 yo female currently ordered furosemide drip 8mg /hr.   All electrolytes within normal limits except K is 3.4, which is slightly below goal range.   Plan:  Will supplement with KCl 40 mEq PO x 1 and will then reorder electrolytes with am labs.   Pharmacy will continue to monitor, thank you for the consult.  Larene Beach, PharmD   08/26/2015

## 2015-08-26 NOTE — Progress Notes (Addendum)
Physical Therapy Treatment Patient Details Name: Teresa Lutz MRN: [id number] DOB: 01/12/1935 Today's Date: 08/26/2015    History of Present Illness Pt admitted for acute on chronic diastolic heart failure. Pt with complaints of leg swelling and tachycardia. Pt with history of ESRD and Afib. Per family report, history of recent falls.    PT Comments    Pt present to be agitated and declined to participate in any EOB or OOB activity. She did agree to bed exercises. B LE have significant edema. Plantar surfaces of feet at tender to touch. She tolerated LE exercises well and required AAROM for some due to weakness. SpO2 98% RA and HR 89. PT discussed importance of getting OOB and participating in more mobility to get stronger to return to PLOF. Pt agrees to try and get OOB next time. She will benefit from continued skilled PT to increase functional mobility and return to PLOF.  Follow Up Recommendations  SNF     Equipment Recommendations       Recommendations for Other Services       Precautions / Restrictions Precautions Precautions: Fall Restrictions Weight Bearing Restrictions: No    Mobility  Bed Mobility               General bed mobility comments: Pt declined to participate in EOB or OOB activity.  Transfers                 General transfer comment: NT; pt declined OOB activity  Ambulation/Gait             General Gait Details: unable at this time   Stairs            Wheelchair Mobility    Modified Rankin (Stroke Patients Only)       Balance                                    Cognition Arousal/Alertness: Awake/alert Behavior During Therapy: WFL for tasks assessed/performed Overall Cognitive Status: Within Functional Limits for tasks assessed                      Exercises Other Exercises Other Exercises: B LE therex supine: ankle pumps, QS, GS, heel slides (AAROM), hip abd slides (AAROM), SAQs x10 each.  Cues for technique and continuing task. Therapeutic rest breaks for energy conservation.    Pt educated on performing ankle pumps, QS and GS multiple times per day to reduce LE swelling and increase strength.    General Comments General comments (skin integrity, edema, etc.): moderate B LE edema      Pertinent Vitals/Pain Pain Assessment: No/denies pain    Home Living                      Prior Function            PT Goals (current goals can now be found in the care plan section) Acute Rehab PT Goals Patient Stated Goal: to get stronger PT Goal Formulation: With patient Time For Goal Achievement: 09/07/15 Potential to Achieve Goals: Good Progress towards PT goals: PT to reassess next treatment    Frequency  Min 2X/week    PT Plan Current plan remains appropriate    Co-evaluation             End of Session   Activity Tolerance: Patient limited by fatigue Patient left: in bed;with  call bell/phone within reach;with bed alarm set     Time: 1605-1620 PT Time Calculation (min) (ACUTE ONLY): 15 min  Charges:  $Therapeutic Exercise: 8-22 mins                    G Codes:      Neoma Laming, PT, DPT  08/26/2015, 4:32 PM [phone]

## 2015-08-26 NOTE — Progress Notes (Signed)
Hospital Problem List     Principal Problem:   Acute on chronic diastolic (congestive) heart failure (HCC) Active Problems:   Hyperlipidemia   Chronic atrial fibrillation (HCC)   Anemia of chronic disease   Atrial fibrillation with RVR (HCC)   Pressure ulcer   End stage renal disease (HCC)   Generalized edema   Hypoalbuminemia due to protein-calorie malnutrition (HCC)   Hypertensive heart disease    Patient Profile:   Primary Cardiologist: Dr. Fletcher Anon  81 y.o. female w/ PMH of chronic atrial fibrillation (on Eliquis), chronic diastolic CHF, Stage 4 CKD, Pulmonary HTN, HLD, OSA (on CPAP), Type 2 DM and HTN who presented to [facility] on 08/22/2015 for increased lower extremity edema over the past week.  Subjective   Reports breathing is better. Says she is "going to the restroom frequently", but only had a net output of -493 mL yesterday. Denies any chest pain or palpitations.  Inpatient Medications    . albumin human  25 g Intravenous Q12H  . apixaban  5 mg Oral BID  . calcitRIOL  0.25 mcg Oral Q M,W,F  . cefTRIAXone (ROCEPHIN)  IV  1 g Intravenous Q24H  . diltiazem  120 mg Oral Daily  . ferrous sulfate  325 mg Oral Q breakfast  . letrozole  2.5 mg Oral Daily  . levothyroxine  25 mcg Oral QAC breakfast  . metoprolol  150 mg Oral BID  . potassium chloride  40 mEq Oral Once  . sodium chloride flush  3 mL Intravenous Q12H  . sodium chloride flush  3 mL Intravenous Q12H    Vital Signs    Filed Vitals:   08/25/15 1940 08/26/15 0545 08/26/15 0548 08/26/15 0927  BP: 146/76  147/83 127/69  Pulse: 111  85 90  Temp: 98.7 F (37.1 C)  97.8 F (36.6 C)   TempSrc: Oral  Oral   Resp: 22  16   Height:      Weight:  211 lb 6.4 oz (95.89 kg)    SpO2: 100%  100%     Intake/Output Summary (Last 24 hours) at 08/26/15 1029 Last data filed at 08/26/15 0920  Gross per 24 hour  Intake  481.6 ml  Output    675 ml  Net -193.4 ml   Filed Weights   08/24/15 0459 08/25/15 0432  08/26/15 0545  Weight: 210 lb 12.8 oz (95.618 kg) 210 lb 11.2 oz (95.573 kg) 211 lb 6.4 oz (95.89 kg)    Physical Exam    General: Pleasant, obese African American female, appearing in no acute distress. Head: Normocephalic, atraumatic.  Neck: Supple without bruits, JVD elevated to 9 cm. Lungs:  Resp regular and unlabored, decreased breath sounds at the bases bilaterally. Heart: Irregularly irregular, S1, S2, no S3, S4, or murmur; no rub. Abdomen: Soft, non-tender, non-distended with normoactive bowel sounds. No hepatomegaly. No rebound/guarding. No obvious abdominal masses. Extremities: No clubbing or cyanosis, 2+ pitting edema bilaterally. Distal pedal pulses are 2+ bilaterally. Neuro: Alert and oriented X 3. Moves all extremities spontaneously. Psych: Normal affect.  Labs    CBC  Recent Labs  08/25/15 0609  WBC 11.9*  HGB 7.2*  HCT 22.6*  MCV 87.4  PLT 99991111   Basic Metabolic Panel  Recent Labs  08/25/15 0609 08/26/15 0441  NA 141 140  K 3.1* 3.4*  3.4*  CL 101 102  CO2 26 27  GLUCOSE 134* 146*  BUN 97* 109*  CREATININE 4.33* 4.41*  CALCIUM 10.2 10.2  MG 2.1 2.2  PHOS 4.8*  --    Fasting Lipid Panel  Recent Labs  08/24/15 0854  CHOL 133  HDL 34*  LDLCALC 65  TRIG 169*  CHOLHDL 3.9    Telemetry    Atrial fibrillation, rate-controlled in the 80's - 90's.   ECG    No new tracings.   Cardiac Studies and Radiology    US Renal: 08/23/2015  CLINICAL DATA:  Acute renal failure. EXAM: RENAL / URINARY TRACT ULTRASOUND COMPLETE COMPARISON:  None. FINDINGS: Right Kidney: Length: 10.3 cm. Mild increased cortical echogenicity. No hydronephrosis. 1.8 cm cyst over the lower pole. Left Kidney: Length: 10.5 cm. Increased cortical echogenicity. No hydronephrosis. 4 cm cyst over the mid pole. Bladder: Appears normal for degree of bladder distention. IMPRESSION: Normal size kidneys without hydronephrosis. Mild increased cortical echogenicity compatible medical renal  disease. Single bilateral renal cysts as described. Electronically Signed   By: Marin Olp M.D.   On: 08/23/2015 12:24   Dg Chest Port 1 View: 08/22/2015  CLINICAL DATA:  BILATERAL lower extremity swelling since this weekend, known CHF, dyspnea, breast cancer, chronic atrial fibrillation, pulmonary hypertension EXAM: PORTABLE CHEST 1 VIEW COMPARISON:  Portable exam 1533 hours compared to 08/01/2015 FINDINGS: Minimal enlargement of cardiac silhouette. Tortuosity of thoracic aorta. Pulmonary vascularity normal. Chronic RIGHT basilar atelectasis. Lungs otherwise clear. No pleural effusion or pneumothorax. Bones demineralized with degenerative disc disease changes thoracic spine and BILATERAL glenohumeral degenerative changes. IMPRESSION: Minimal enlargement of cardiac silhouette with chronic RIGHT basilar atelectasis. Electronically Signed   By: Lavonia Dana M.D.   On: 08/22/2015 15:50   Echocardiogram: 05/02/2015 Study Conclusions - Left ventricle: The cavity size was normal. There was mild concentric hypertrophy. Systolic function was normal. The estimated ejection fraction was in the range of 60% to 65%. Wall motion was normal; there were no regional wall motion abnormalities. The study is not technically sufficient to allow evaluation of LV diastolic function. - Left atrium: The atrium was mildly dilated. - Right ventricle: Systolic function was normal. - Pulmonary arteries: Systolic pressure was mildly elevated PA peak pressure: 41 mm Hg (S).  Impressions: - Rhythm is atrial fibrillation.  Assessment & Plan    1. Acute on Chronic Diastolic CHF - presented with worsening lower extremity edema and mild orthopnea and dyspnea. Was seen by Dr. Fletcher Anon on 08/16/2015 and reported not taking her diuretic for over a week.  - BNP 809 on admission. CXR showed minimal enlargement of the cardiac silhouette with chronic right basilar atelectasis. - Weight 216 lbs on 08/16/2015. In 04/2015, she  was 192 lbs. Weight at time of admission was 210 lbs, reported as 211 lbs on 08/26/2015. - fluid and sodium restriction reviewed with the patient, along with the importance of medication compliance and obtaining daily weights. - Nephrology started IV Lasix drip. Recorded net output of only -553mL this admission. Their note on 08/25/2015 makes mention that with her low-output and worsening creatinine, she will likely need to convert to HD.  2. Chronic atrial fibrillation - presented in atrial fibrillation with RVR, HR in the 140's. Likely secondary to her significant volume overload. - Started on IV Cardizem at time of admission. HR currently well-controlled in the 70's - 80's. Continue Cardizem CD 120mg  daily. - This patients CHA2DS2-VASc Score and unadjusted Ischemic Stroke Rate (% per year) is equal to 7.2 % stroke rate/year from a score of 5 (CHF, HTN, Female, Age (2)). Continue Eliquis for anticoagulation.  3. Stage 4 CKD - creatinine 4.19  on admission, at 4.41 on 08/26/2015. Was 3.39 in 02/2015. - Nephrology following. Will likely require HD.   4. HTN - BP has been 127/61 - 147/83 in the past 24 hours. - continue current medication regimen.  5. HLD - reported in her history, but not currently on statin therapy. - LDL 65 this admission.  6. OSA  - on CPAP  7. Anemia of Chronic Kidney Disease  - Hgb 7.2 on 08/24/2015. Was 9.3 two months ago. - Nephrology following.  8. Elevated Troponin - troponin 0.08 on admission. Likely secondary to demand ischemia in the setting of CHF/AF RVR. Renal failure may also be playing a role. - Low risk MV in 03/2015 (no ischemia). - Given renal failure and recent neg study, would not pursue further ischemic evaluation at this time.  9. Hypokalemia - potassium 3.4 on 08/26/2015 - scheduled to be replaced today.  Signed, Erma Heritage , PA-C 10:29 AM 08/26/2015 Pager: [phone]

## 2015-08-26 NOTE — Progress Notes (Signed)
CSW called patient's daughter [name] [phone]). Left voicemail. Awaiting call back. CSW will continue to follow and assist.   Ernest Pine, MSW, [facility] Work Department [phone]

## 2015-08-26 NOTE — Progress Notes (Signed)
[facility]  ROUNDING NOTE   Subjective:  Urine output recorded as 975 cc. Patient continues on Lasix drip. Potassium currently low at 3.4. Still has considerable lower extremity edema. Manuela Schwartz creatinine continues to worsen. BUN level is now 4.4   Objective:  Vital signs in last 24 hours:  Temp:  [97.7 F (36.5 C)-98.7 F (37.1 C)] 97.7 F (36.5 C) (03/03 1147) Pulse Rate:  [85-111] 97 (03/03 1147) Resp:  [16-22] 18 (03/03 1147) BP: (127-150)/(69-89) 150/89 mmHg (03/03 1147) SpO2:  [99 %-100 %] 99 % (03/03 1147) Weight:  [95.89 kg (211 lb 6.4 oz)] 95.89 kg (211 lb 6.4 oz) (03/03 0545)  Weight change: 0.318 kg (11.2 oz) Filed Weights   08/24/15 0459 08/25/15 0432 08/26/15 0545  Weight: 95.618 kg (210 lb 12.8 oz) 95.573 kg (210 lb 11.2 oz) 95.89 kg (211 lb 6.4 oz)    Intake/Output: I/O last 3 completed shifts: In: 581.6 [P.O.:240; I.V.:91.6; IV Piggyback:250] Out: 1125 [Urine:1125]   Intake/Output this shift:  Total I/O In: 480 [P.O.:480] Out: -   Physical Exam: General: NAD, resting in bed  Head: Normocephalic, atraumatic. Moist oral mucosal membranes  Eyes: Anicteric   Neck: Supple, trachea midline  Lungs:  basiilar rales, normal effort  Heart: S1S2 irregular  Abdomen:  Soft, nontender, BS [resemt   Extremities: 3+ peripheral edema.  Neurologic: Nonfocal, moving all four extremities. Able to follow commands  Skin: No lesions       Basic Metabolic Panel:  Recent Labs Lab 08/22/15 1519 08/22/15 1915 08/23/15 0636 08/23/15 1744 08/24/15 0854 08/24/15 1657 08/25/15 0609 08/26/15 0441  NA 143  --  144  --  142  --  141 140  K 3.7  --  3.2* 4.8 3.0* 3.7 3.1* 3.4*  3.4*  CL 104  --  107  --  102  --  101 102  CO2 24  --  25  --  26  --  26 27  GLUCOSE 135*  --  135*  --  146*  --  134* 146*  BUN 85*  --  89*  --  96*  --  97* 109*  CREATININE 4.19*  --  4.14*  --  4.16*  --  4.33* 4.41*  CALCIUM 9.9  --  9.5  --  10.0  --  10.2 10.2  MG   --  2.3  --   --   --   --  2.1 2.2  PHOS  --  5.6*  --   --   --   --  4.8*  --     Liver Function Tests:  Recent Labs Lab 08/22/15 1519  AST 14*  ALT 18  ALKPHOS 65  BILITOT 0.5  PROT 6.6  ALBUMIN 2.5*   No results for input(s): LIPASE, AMYLASE in the last 168 hours. No results for input(s): AMMONIA in the last 168 hours.  CBC:  Recent Labs Lab 08/22/15 1519 08/23/15 0636 08/25/15 0609  WBC 11.3* 9.2 11.9*  NEUTROABS 10.0*  --   --   HGB 8.9* 8.2* 7.2*  HCT 27.7* 25.6* 22.6*  MCV 88.8 88.0 87.4  PLT 327 284 267    Cardiac Enzymes:  Recent Labs Lab 08/22/15 1519  TROPONINI 0.08*    BNP: Invalid input(s): POCBNP  CBG:  Recent Labs Lab 08/22/15 1823 08/22/15 1947  GLUCAP 97 87    Microbiology: Results for orders placed or performed during the hospital encounter of 08/22/15  MRSA PCR Screening  Status: None   Collection Time: 08/22/15  6:36 PM  Result Value Ref Range Status   MRSA by PCR NEGATIVE NEGATIVE Final    Comment:        The GeneXpert MRSA Assay (FDA approved for NASAL specimens only), is one component of a comprehensive MRSA colonization surveillance program. It is not intended to diagnose MRSA infection nor to guide or monitor treatment for MRSA infections.   Urine culture     Status: None (Preliminary result)   Collection Time: 08/24/15 11:18 AM  Result Value Ref Range Status   Specimen Description URINE, RANDOM  Final   Special Requests NONE  Final   Culture   Final    >=100,000 COLONIES/mL GRAM NEGATIVE RODS IDENTIFICATION AND SUSCEPTIBILITIES TO FOLLOW    Report Status PENDING  Incomplete    Coagulation Studies: No results for input(s): LABPROT, INR in the last 72 hours.  Urinalysis:  Recent Labs  08/24/15 1118  COLORURINE AMBER*  LABSPEC 1.008  PHURINE 5.0  GLUCOSEU NEGATIVE  HGBUR 1+*  BILIRUBINUR NEGATIVE  KETONESUR NEGATIVE  PROTEINUR 30*  NITRITE POSITIVE*  LEUKOCYTESUR 3+*      Imaging: No  results found.   Medications:   . diltiazem (CARDIZEM) infusion Stopped (08/23/15 1201)  . furosemide (LASIX) infusion 8 mg/hr (08/26/15 0024)   . albumin human  25 g Intravenous Q12H  . apixaban  5 mg Oral BID  . calcitRIOL  0.25 mcg Oral Q M,W,F  . cefTRIAXone (ROCEPHIN)  IV  1 g Intravenous Q24H  . diltiazem  120 mg Oral Daily  . ferrous sulfate  325 mg Oral Q breakfast  . letrozole  2.5 mg Oral Daily  . levothyroxine  25 mcg Oral QAC breakfast  . metoprolol  150 mg Oral BID  . sodium chloride flush  3 mL Intravenous Q12H  . sodium chloride flush  3 mL Intravenous Q12H   sodium chloride, acetaminophen **OR** acetaminophen, albuterol, ondansetron **OR** ondansetron (ZOFRAN) IV, oxyCODONE-acetaminophen, sodium chloride flush  Assessment/ Plan:  81 y.o. female with atrial fibrillation, anemia, coronary artery disease, diabetes mellitus type 2, hypertension, hyperlipidemia, history of breast cancer, CKD stage IV/V who presents with increasing shortness of breath and edema.  1. Acute renal failure/chronic kidney disease stage IV/5.  Baseline EGFR 18 however EGFR has been lower than this in the past.  Patient has worsening renal function now. Unclear if this is progression to end-stage renal disease versus a true episode of acute renal failure.  -  Renal function not significantly changed. Creatinine remains high at 4.4. She may actually have end-stage renal disease. We will continue with Lasix drip over the weekend. However if renal function remains low like this she will likely need to convert to hemodialysis, starting early next week.  2. Lower extremity edema and anasarca. Remains 3+ on exam today. Continue Lasix drip for now   3. Anemia of chronic kidney disease. Patient was followed by Dr. Grayland Ormond as an outpatient. Hemoglobin currently low at 7.2.  As she will be transitioning most likely to hemodialysis we would recommend initiating Epogen with dialysis.  4. Secondary  hyperparathyroidism. Continue the patient calcitriol 0.25 g by mouth 3 times a week.  5. Hypokalemia.  Potassium remains low. Agree with administering potassium chloride prn    LOS: 4 Tynesia Harral 3/3/20173:05 PM

## 2015-08-26 NOTE — Care Management (Signed)
Barrier to discharge : Worsening renal failure and continuous lasix infusion.  Most likely is going to require hemodialysis

## 2015-08-26 NOTE — Progress Notes (Signed)
Patient resting comfortably at this time, no complaints. Teresa Lutz

## 2015-08-26 NOTE — Progress Notes (Signed)
[place] at [place] NAME: Teresa Lutz    MR#:  [id number]  DATE OF BIRTH:  1934-08-01  SUBJECTIVE:  More alert today states no more leg pain  REVIEW OF SYSTEMS:  CONSTITUTIONAL: No fever, fatigue or weakness.  EYES: No blurred or double vision.  EARS, NOSE, AND THROAT: No tinnitus or ear pain.  RESPIRATORY: No cough, shortness of breath, wheezing or hemoptysis.  CARDIOVASCULAR: No chest pain, positive orthopnea, positive edema.  GASTROINTESTINAL: No nausea, vomiting, diarrhea or abdominal pain.  GENITOURINARY: No dysuria, hematuria.  ENDOCRINE: No polyuria, nocturia,  HEMATOLOGY: No anemia, easy bruising or bleeding SKIN: No rash or lesion. MUSCULOSKELETAL: No joint pain or arthritis.   NEUROLOGIC: No tingling, numbness, weakness.  PSYCHIATRY: No anxiety or depression.   DRUG ALLERGIES:   Allergies  Allergen Reactions  . Celebrex [Celecoxib] Hives    VITALS:  Blood pressure 150/89, pulse 97, temperature 97.7 F (36.5 C), temperature source Oral, resp. rate 18, height 5\' 7"  (1.702 m), weight 95.89 kg (211 lb 6.4 oz), SpO2 99 %.  PHYSICAL EXAMINATION:  VITAL SIGNS: Filed Vitals:   08/26/15 0927 08/26/15 1147  BP: 127/69 150/89  Pulse: 90 97  Temp:  97.7 F (36.5 C)  Resp:  53   GENERAL:80 y.o.female currently in no acute distress.  HEAD: Normocephalic, atraumatic.  EYES: Pupils equal, round, reactive to light. Extraocular muscles intact. No scleral icterus.  MOUTH: Moist mucosal membrane. Dentition intact. No abscess noted.  EAR, NOSE, THROAT: Clear without exudates. No external lesions.  NECK: Supple. No thyromegaly. No nodules. No JVD.  PULMONARY: Diminished without wheeze rails or rhonci. No use of accessory muscles, Good respiratory effort. good air entry bilaterally CHEST: Nontender to palpation.  CARDIOVASCULAR: S1 and S2. Irregular rate and rhythm. No murmurs, rubs, or gallops. 3+ edema. Pedal pulses 2+  bilaterally.  GASTROINTESTINAL: Soft, nontender, nondistended. No masses. Positive bowel sounds. No hepatosplenomegaly.  MUSCULOSKELETAL: No swelling, clubbing, or edema. Range of motion full in all extremities.  NEUROLOGIC: Cranial nerves II through XII are intact. No gross focal neurological deficits. Sensation intact. Reflexes intact.  SKIN: No ulceration, lesions, rashes, or cyanosis. Skin warm and dry. Turgor intact.  PSYCHIATRIC: Mood, affect blunted, sleeping but arousable   LABORATORY PANEL:   CBC  Recent Labs Lab 08/25/15 0609  WBC 11.9*  HGB 7.2*  HCT 22.6*  PLT 267   ------------------------------------------------------------------------------------------------------------------  Chemistries   Recent Labs Lab 08/22/15 1519  08/26/15 0441  NA 143  < > 140  K 3.7  < > 3.4*  3.4*  CL 104  < > 102  CO2 24  < > 27  GLUCOSE 135*  < > 146*  BUN 85*  < > 109*  CREATININE 4.19*  < > 4.41*  CALCIUM 9.9  < > 10.2  MG  --   < > 2.2  AST 14*  --   --   ALT 18  --   --   ALKPHOS 65  --   --   BILITOT 0.5  --   --   < > = values in this interval not displayed. ------------------------------------------------------------------------------------------------------------------  Cardiac Enzymes  Recent Labs Lab 08/22/15 1519  TROPONINI 0.08*   ------------------------------------------------------------------------------------------------------------------  RADIOLOGY:  No results found.  EKG:   Orders placed or performed during the hospital encounter of 08/22/15  . ED EKG  . ED EKG    ASSESSMENT AND PLAN:   81 year old African female history of atrial fibrillation, chronic  kidney disease stage V, congestive heart failure ejection fraction 60-65 presenting with worsening edema  1. Acute on chronic diastolic congestive heart failure: Cardiology nephrology input appreciated, continue diuresis with IV Lasix for urine output renal function, DuoNeb treatments as  required, oxygen as required 2. Atrial fibrillation rapid ventricular response: Cardizem, stabilized 3. Acute kidney injury on chronic kidney disease: Fairly stable however if no improvement will likely require dialysis 4. Hypokalemia: Replace goal 4-5 5. Urinary tract infection site unspecified: Start ceftriaxone follow culture data 6. Hypothyroidism unspecified: Synthroid 7. Hypertension essential: Lopressor, Benicar 8. Venous thromboembolism prophylactic: Eliquis therapeutic  Disposition:  physical therapy evaluation   All the records are reviewed and case discussed with Care Management/Social Workerr. Management plans discussed with the patient, family and they are in agreement.  CODE STATUS: Full  TOTAL TIME TAKING CARE OF THIS PATIENT: 59minutes.   POSSIBLE D/C IN 2-3 DAYS, DEPENDING ON CLINICAL CONDITION.   Cotey Rakes,  Karenann Cai.D on 08/26/2015 at 3:00 PM  Between 7am to 6pm - Pager - [phone]  After 6pm: House Pager: - [facility]  Office  [phone]  CC: Primary care physician; Casilda Carls, MD

## 2015-08-27 DIAGNOSIS — R7989 Other specified abnormal findings of blood chemistry: Secondary | ICD-10-CM

## 2015-08-27 DIAGNOSIS — I482 Chronic atrial fibrillation: Secondary | ICD-10-CM

## 2015-08-27 DIAGNOSIS — D638 Anemia in other chronic diseases classified elsewhere: Secondary | ICD-10-CM

## 2015-08-27 LAB — BASIC METABOLIC PANEL
ANION GAP: 14 (ref 5–15)
BUN: 103 mg/dL — ABNORMAL HIGH (ref 6–20)
CALCIUM: 10.2 mg/dL (ref 8.9–10.3)
CHLORIDE: 98 mmol/L — AB (ref 101–111)
CO2: 24 mmol/L (ref 22–32)
Creatinine, Ser: 4.37 mg/dL — ABNORMAL HIGH (ref 0.44–1.00)
GFR calc non Af Amer: 9 mL/min — ABNORMAL LOW (ref >60)
GFR, EST AFRICAN AMERICAN: 10 mL/min — AB (ref >60)
Glucose, Bld: 145 mg/dL — ABNORMAL HIGH (ref 65–99)
Potassium: 3.3 mmol/L — ABNORMAL LOW (ref 3.5–5.1)
SODIUM: 136 mmol/L (ref 135–145)

## 2015-08-27 LAB — MAGNESIUM: Magnesium: 2.1 mg/dL (ref 1.7–2.4)

## 2015-08-27 LAB — CBC
HEMATOCRIT: 20.8 % — AB (ref 35.0–47.0)
HEMOGLOBIN: 6.8 g/dL — AB (ref 12.0–16.0)
MCH: 28.5 pg (ref 26.0–34.0)
MCHC: 32.7 g/dL (ref 32.0–36.0)
MCV: 87.2 fL (ref 80.0–100.0)
Platelets: 296 10*3/uL (ref 150–440)
RBC: 2.39 MIL/uL — AB (ref 3.80–5.20)
RDW: 16.6 % — ABNORMAL HIGH (ref 11.5–14.5)
WBC: 10.3 10*3/uL (ref 3.6–11.0)

## 2015-08-27 LAB — URINE CULTURE: Culture: >=100000

## 2015-08-27 LAB — PHOSPHORUS: PHOSPHORUS: 5.3 mg/dL — AB (ref 2.5–4.6)

## 2015-08-27 LAB — CREATININE, SERUM

## 2015-08-27 LAB — IRON AND TIBC: TIBC: 158 ug/dL — AB (ref 250–450)

## 2015-08-27 LAB — FERRITIN: FERRITIN: 185 ng/mL (ref 11–307)

## 2015-08-27 MED ORDER — CEPHALEXIN 250 MG/5ML PO SUSR
250.0000 mg | Freq: Two times a day (BID) | ORAL | Status: DC
  Filled 2015-08-27: qty 5

## 2015-08-27 MED ORDER — METOLAZONE 2.5 MG PO TABS
5.0000 mg | ORAL_TABLET | Freq: Every day | ORAL | Status: AC
  Administered 2015-08-27 – 2015-08-29 (×3): 5 mg via ORAL
  Filled 2015-08-27 (×3): qty 2

## 2015-08-27 MED ORDER — HEPARIN SODIUM (PORCINE) 5000 UNIT/ML IJ SOLN
5000.0000 [IU] | Freq: Three times a day (TID) | INTRAMUSCULAR | Status: DC
  Administered 2015-08-27 – 2015-08-29 (×5): 5000 [IU] via SUBCUTANEOUS
  Filled 2015-08-27 (×5): qty 1

## 2015-08-27 MED ORDER — CEPHALEXIN 250 MG/5ML PO SUSR
250.0000 mg | Freq: Two times a day (BID) | ORAL | Status: DC
  Administered 2015-08-28 – 2015-08-29 (×4): 250 mg via ORAL
  Filled 2015-08-27 (×6): qty 5

## 2015-08-27 MED ORDER — POTASSIUM CHLORIDE CRYS ER 20 MEQ PO TBCR
40.0000 meq | EXTENDED_RELEASE_TABLET | Freq: Two times a day (BID) | ORAL | Status: AC
  Administered 2015-08-27 (×2): 40 meq via ORAL
  Filled 2015-08-27 (×2): qty 2

## 2015-08-27 MED ORDER — HYDRALAZINE HCL 20 MG/ML IJ SOLN
10.0000 mg | INTRAMUSCULAR | Status: DC | PRN
  Administered 2015-08-27: 10 mg via INTRAVENOUS
  Filled 2015-08-27: qty 1

## 2015-08-27 NOTE — Progress Notes (Addendum)
[place] at [place] NAME: Teresa Lutz    MR#:  [id number]  DATE OF BIRTH:  August 14, 1934  SUBJECTIVE:  Remains somewhat obtunded. C/o pain in hands, inability to eat.   REVIEW OF SYSTEMS:  CONSTITUTIONAL: No fever, fatigue or weakness.  EYES: No blurred or double vision.  EARS, NOSE, AND THROAT: No tinnitus or ear pain.  RESPIRATORY: No cough, shortness of breath, wheezing or hemoptysis.  CARDIOVASCULAR: No chest pain, positive orthopnea, positive edema.  GASTROINTESTINAL: No nausea, vomiting, diarrhea or abdominal pain.  GENITOURINARY: No dysuria, hematuria.  ENDOCRINE: No polyuria, nocturia,  HEMATOLOGY: No anemia, easy bruising or bleeding SKIN: No rash or lesion. MUSCULOSKELETAL: No joint pain or arthritis.   NEUROLOGIC: No tingling, numbness, weakness.  PSYCHIATRY: No anxiety or depression.   DRUG ALLERGIES:   Allergies  Allergen Reactions  . Celebrex [Celecoxib] Hives    VITALS:  Blood pressure 150/88, pulse 88, temperature 97.9 F (36.6 C), temperature source Oral, resp. rate 18, height 5\' 7"  (1.702 m), weight 96.299 kg (212 lb 4.8 oz), SpO2 99 %.  PHYSICAL EXAMINATION:  VITAL SIGNS: Filed Vitals:   08/27/15 0600 08/27/15 1207  BP: 122/77 150/88  Pulse:  88  Temp:  97.9 F (36.6 C)  Resp:  26   GENERAL:81 y.o.female currently in no acute distress.  HEAD: Normocephalic, atraumatic.  EYES: Pupils equal, round, reactive to light. Extraocular muscles intact. No scleral icterus.  MOUTH: Moist mucosal membrane. Dentition intact. No abscess noted.  EAR, NOSE, THROAT: Clear without exudates. No external lesions.  NECK: Supple. No thyromegaly. No nodules. No JVD.  PULMONARY: Diminished without wheeze rails or rhonci. No use of accessory muscles, Good respiratory effort. good air entry bilaterally CHEST: Nontender to palpation.  CARDIOVASCULAR: S1 and S2. Irregular rate and rhythm. No murmurs, rubs, or gallops. 3+  edema. Pedal pulses 2+ bilaterally.  GASTROINTESTINAL: Soft, nontender, nondistended. No masses. Positive bowel sounds. No hepatosplenomegaly.  MUSCULOSKELETAL: 3 plus leg and feet , arm swelling, no clubbing, cyanosis. Range of motion decreased in all extremities.  NEUROLOGIC: Cranial nerves II through XII are intact. No gross focal neurological deficits. Sensation intact. Reflexes intact.  SKIN: No ulceration, lesions, rashes, or cyanosis. Skin warm and dry. Turgor intact.  PSYCHIATRIC: Mood, affect blunted, somnolent, sitting up, chatting   LABORATORY PANEL:   CBC  Recent Labs Lab 08/27/15 0615  WBC 10.3  HGB 6.8*  HCT 20.8*  PLT 296   ------------------------------------------------------------------------------------------------------------------  Chemistries   Recent Labs Lab 08/22/15 1519  08/27/15 0615  NA 143  < > 136  K 3.7  < > 3.3*  CL 104  < > 98*  CO2 24  < > 24  GLUCOSE 135*  < > 145*  BUN 85*  < > 103*  CREATININE 4.19*  < > 4.37*  TEST WILL BE CREDITED  CALCIUM 9.9  < > 10.2  MG  --   < > 2.1  AST 14*  --   --   ALT 18  --   --   ALKPHOS 65  --   --   BILITOT 0.5  --   --   < > = values in this interval not displayed. ------------------------------------------------------------------------------------------------------------------  Cardiac Enzymes  Recent Labs Lab 08/22/15 1519  TROPONINI 0.08*   ------------------------------------------------------------------------------------------------------------------  RADIOLOGY:  No results found.  EKG:   Orders placed or performed during the hospital encounter of 08/22/15  . ED EKG  . ED EKG  ASSESSMENT AND PLAN:   81 year old African female history of atrial fibrillation, chronic kidney disease stage V, congestive heart failure ejection fraction 60-65 presenting with worsening edema  1. Acute on chronic diastolic congestive heart failure: Cardiology nephrology input appreciated,  continue diuresis with IV Lasix , following urine output,  renal function, DuoNeb treatments as required, oxygen as required, remains on RA, urinary output 975 ml, ins/outs negative by about 240 ml since admission 2. Atrial fibrillation rapid ventricular response: Cardizem, stabilized 3. Acute kidney injury on chronic kidney disease: Fairly stable however will likely require dialysis, appreciate nephrologist input 4. Hypokalemia: Replacing goal 3.5-4.0, Mg level is normal 5. Urinary tract infection site unspecified due to klebsiella pneumoniae: change ceftriaxone to keflex po 6. Hypothyroidism unspecified: Synthroid 7. Hypertension essential: Lopressor, cardizem, off Benicar due to renal failure 8. Venous thromboembolism prophylactic: discontinue Eliquis , start heparin SQ 9 metabolic vs uremic encephalopathy, stable/improved, following 10 hand pain,? Etiology, possibly uremia, following, may start gabapentin if no improvement with HD 11 anemia, likely of chronic disease, get iron studies, transfuse after discussing risks and benefits tomorrow. Prior iron studies indicate loss of iron , get hemocccult Disposition:  physical therapy evaluation   All the records are reviewed and case discussed with Care Management/Social Workerr. Management plans discussed with the patient, family and they are in agreement.  CODE STATUS: Full  TOTAL TIME TAKING CARE OF THIS PATIENT: 35 minutes.   POSSIBLE D/C IN 2-3 DAYS, DEPENDING ON CLINICAL CONDITION.   Theodoro Grist M.D on 08/27/2015 at 4:58 PM  Between 7am to 6pm - Pager - [phone]  After 6pm: House Pager: - [phone]  [facility]  Office  [phone]  CC: Primary care physician; Casilda Carls, MD

## 2015-08-27 NOTE — Progress Notes (Signed)
[facility] NOTE  Pharmacy Consult for electrolyte monitoring  Indication: furosemide drip    Allergies  Allergen Reactions  . Celebrex [Celecoxib] Hives   Patient Measurements: Height: 5\' 7"  (170.2 cm) Weight: 212 lb 4.8 oz (96.299 kg) IBW/kg (Calculated) : 61.6  Vital Signs: Temp: 97.9 F (36.6 C) (03/04 1207) Temp Source: Oral (03/04 1207) BP: 150/88 mmHg (03/04 1207) Pulse Rate: 88 (03/04 1207) Intake/Output from previous day: 03/03 0701 - 03/04 0700 In: 580 [P.O.:530; IV Piggyback:50] Out: -  Intake/Output from this shift: Total I/O In: 120 [P.O.:120] Out: -  Vent settings for last 24 hours:    Labs:  Recent Labs  08/25/15 0609 08/26/15 0441 08/27/15 0615  WBC 11.9*  --  10.3  HGB 7.2*  --  6.8*  HCT 22.6*  --  20.8*  PLT 267  --  296  CREATININE 4.33* 4.41* 4.37*  TEST WILL BE CREDITED  MG 2.1 2.2 2.1  PHOS 4.8*  --  5.3*   CrCl cannot be calculated (Patient has no serum creatinine result on file.).  No results for input(s): GLUCAP in the last 72 hours.  Assessment: Pharmacy consulted for electrolyte monitoring and management for 81 yo female currently ordered furosemide drip 8mg /hr.   All electrolytes within normal limits except K is 3.3, which is slightly below goal range.   Plan:  Will supplement with KCl 40 mEq PO x 2  and will then reorder electrolytes with am labs.   Pharmacy will continue to monitor, thank you for the consult.  Larene Beach, PharmD   08/27/2015

## 2015-08-27 NOTE — Progress Notes (Signed)
Lasix gtt at 8. Pt reports no pain. A fib. Room air. Takes meds ok. A & o. Bedpan. UTI positive and IV abx. Pt has no further concerns at this time.

## 2015-08-27 NOTE — Progress Notes (Signed)
     SUBJECTIVE: No pain or SOB  Tele: atrial fib, rate 100-110 bpm  BP 122/77 mmHg  Pulse 96  Temp(Src) 98.4 F (36.9 C) (Oral)  Resp 18  Ht 5\' 7"  (1.702 m)  Wt 212 lb 4.8 oz (96.299 kg)  BMI 33.24 kg/m2  SpO2 100%  Intake/Output Summary (Last 24 hours) at 08/27/15 0856 Last data filed at 08/26/15 2200  Gross per 24 hour  Intake    580 ml  Output      0 ml  Net    580 ml    PHYSICAL EXAM General: Well developed, well nourished, in no acute distress. Alert and oriented x 3.  Psych:  Good affect, responds appropriately Neck: No JVD. No masses noted.  Lungs: Clear bilaterally with no wheezes or rhonci noted.  Heart: irreg irreg with no murmurs noted. Abdomen: Bowel sounds are present. Soft, non-tender.  Extremities: 1-2+ bilateral lower extremity edema.   LABS: Basic Metabolic Panel:  Recent Labs  08/25/15 0609 08/26/15 0441 08/27/15 0615  NA 141 140 136  K 3.1* 3.4*  3.4* 3.3*  CL 101 102 98*  CO2 26 27 24   GLUCOSE 134* 146* 145*  BUN 97* 109* 103*  CREATININE 4.33* 4.41* 4.37*  TEST WILL BE CREDITED  CALCIUM 10.2 10.2 10.2  MG 2.1 2.2 2.1  PHOS 4.8*  --  5.3*   CBC:  Recent Labs  08/25/15 0609 08/27/15 0615  WBC 11.9* 10.3  HGB 7.2* 6.8*  HCT 22.6* 20.8*  MCV 87.4 87.2  PLT 267 296   Current Meds: . albumin human  25 g Intravenous Q12H  . apixaban  5 mg Oral BID  . calcitRIOL  0.25 mcg Oral Q M,W,F  . cefTRIAXone (ROCEPHIN)  IV  1 g Intravenous Q24H  . diltiazem  120 mg Oral Daily  . ferrous sulfate  325 mg Oral Q breakfast  . letrozole  2.5 mg Oral Daily  . levothyroxine  25 mcg Oral QAC breakfast  . metoprolol  150 mg Oral BID  . sodium chloride flush  3 mL Intravenous Q12H  . sodium chloride flush  3 mL Intravenous Q12H     ASSESSMENT AND PLAN:  1. Acute on Chronic Diastolic CHF: Presented with worsening lower extremity edema and mild orthopnea and dyspnea. Was seen by Dr. Fletcher Anon on 08/16/2015 and reported not taking her diuretic  for over a week. BNP 809 on admission. CXR showed minimal enlargement of the cardiac silhouette with chronic right basilar atelectasis. Weight 216 lbs on 08/16/2015. In 04/2015, she was 192 lbs. Weight at time of admission was 210 lbs, reported as 211 lbs on 08/26/2015. Nephrology started IV Lasix drip but she has diuresed poorly. Likely that HD will be required. The Lasix drip is being managed by the Nephrology team.   2. Atrial fibrillation, persistent: She presented in atrial fibrillation with RVR, HR in the 140's. Likely secondary to her significant volume overload. She is now rate controlled on po Cardizem. CHA2DS2-VASc Score is 5. Will continue Eliquis for anticoagulation. No new recs today.   3. Stage 4 CKD: followed by Nephrology.   4. HTN: BP stable.  5. Elevated Troponin: Troponin 0.08 on admission. Likely secondary to demand ischemia in the setting of CHF/AF RVR/anemia/renal failure. Low risk stress myoview in 03/2015 (no ischemia). Given renal failure and recent neg study, would not pursue further ischemic evaluation at this time.  6. Anemia: per primary team    Sakai Heinle  3/4/20178:56 AM

## 2015-08-27 NOTE — Progress Notes (Signed)
[facility]  ROUNDING NOTE   Subjective:  Urine output n/a today Patient continues on Lasix drip. Potassium currently low at 3.3 Still has considerable lower extremity edema. S creatinine and BUN levels continue to be high     Objective:  Vital signs in last 24 hours:  Temp:  [97.8 F (36.6 C)-98.4 F (36.9 C)] 97.9 F (36.6 C) (03/04 1207) Pulse Rate:  [81-96] 88 (03/04 1207) Resp:  [18] 18 (03/04 1207) BP: (122-164)/(77-110) 150/88 mmHg (03/04 1207) SpO2:  [98 %-100 %] 99 % (03/04 1207) Weight:  [96.299 kg (212 lb 4.8 oz)] 96.299 kg (212 lb 4.8 oz) (03/04 0536)  Weight change: 0.408 kg (14.4 oz) Filed Weights   08/25/15 0432 08/26/15 0545 08/27/15 0536  Weight: 95.573 kg (210 lb 11.2 oz) 95.89 kg (211 lb 6.4 oz) 96.299 kg (212 lb 4.8 oz)    Intake/Output: I/O last 3 completed shifts: In: 771.6 [P.O.:530; I.V.:91.6; IV Piggyback:150] Out: 375 [Urine:375]   Intake/Output this shift:  Total I/O In: 120 [P.O.:120] Out: -   Physical Exam: General: NAD, resting in bed  Head:  Moist oral mucosal membranes  Eyes: Anicteric   Neck: Supple, trachea midline  Lungs:  basiilar rales, normal effort  Heart: S1S2 irregular  Abdomen:  Soft, nontender, BS [resemt   Extremities: 3+ peripheral edema.  Neurologic: Nonfocal, moving all four extremities. Able to follow commands  Skin: No lesions       Basic Metabolic Panel:  Recent Labs Lab 08/22/15 1915 08/23/15 0636  08/24/15 0854 08/24/15 1657 08/25/15 0609 08/26/15 0441 08/27/15 0615  NA  --  144  --  142  --  141 140 136  K  --  3.2*  < > 3.0* 3.7 3.1* 3.4*  3.4* 3.3*  CL  --  107  --  102  --  101 102 98*  CO2  --  25  --  26  --  26 27 24   GLUCOSE  --  135*  --  146*  --  134* 146* 145*  BUN  --  89*  --  96*  --  97* 109* 103*  CREATININE  --  4.14*  --  4.16*  --  4.33* 4.41* 4.37*  TEST WILL BE CREDITED  CALCIUM  --  9.5  --  10.0  --  10.2 10.2 10.2  MG 2.3  --   --   --   --  2.1 2.2 2.1   PHOS 5.6*  --   --   --   --  4.8*  --  5.3*  < > = values in this interval not displayed.  Liver Function Tests:  Recent Labs Lab 08/22/15 1519  AST 14*  ALT 18  ALKPHOS 65  BILITOT 0.5  PROT 6.6  ALBUMIN 2.5*   No results for input(s): LIPASE, AMYLASE in the last 168 hours. No results for input(s): AMMONIA in the last 168 hours.  CBC:  Recent Labs Lab 08/22/15 1519 08/23/15 0636 08/25/15 0609 08/27/15 0615  WBC 11.3* 9.2 11.9* 10.3  NEUTROABS 10.0*  --   --   --   HGB 8.9* 8.2* 7.2* 6.8*  HCT 27.7* 25.6* 22.6* 20.8*  MCV 88.8 88.0 87.4 87.2  PLT 327 284 267 296    Cardiac Enzymes:  Recent Labs Lab 08/22/15 1519  TROPONINI 0.08*    BNP: Invalid input(s): POCBNP  CBG:  Recent Labs Lab 08/22/15 1823 08/22/15 1947  GLUCAP 97 87    Microbiology: Results  for orders placed or performed during the hospital encounter of 08/22/15  MRSA PCR Screening     Status: None   Collection Time: 08/22/15  6:36 PM  Result Value Ref Range Status   MRSA by PCR NEGATIVE NEGATIVE Final    Comment:        The GeneXpert MRSA Assay (FDA approved for NASAL specimens only), is one component of a comprehensive MRSA colonization surveillance program. It is not intended to diagnose MRSA infection nor to guide or monitor treatment for MRSA infections.   Urine culture     Status: None   Collection Time: 08/24/15 11:18 AM  Result Value Ref Range Status   Specimen Description URINE, RANDOM  Final   Special Requests NONE  Final   Culture >=100,000 COLONIES/mL KLEBSIELLA PNEUMONIAE  Final   Report Status 08/27/2015 FINAL  Final   Organism ID, Bacteria KLEBSIELLA PNEUMONIAE  Final      Susceptibility   Klebsiella pneumoniae - MIC*    AMPICILLIN RESISTANT Resistant     CEFAZOLIN <=4 SENSITIVE Sensitive     CEFTRIAXONE <=1 SENSITIVE Sensitive     CIPROFLOXACIN <=0.25 SENSITIVE Sensitive     GENTAMICIN <=1 SENSITIVE Sensitive     IMIPENEM <=0.25 SENSITIVE Sensitive      NITROFURANTOIN 64 INTERMEDIATE Intermediate     TRIMETH/SULFA <=20 SENSITIVE Sensitive     AMPICILLIN/SULBACTAM <=2 SENSITIVE Sensitive     PIP/TAZO <=4 SENSITIVE Sensitive     Extended ESBL NEGATIVE Sensitive     * >=100,000 COLONIES/mL KLEBSIELLA PNEUMONIAE    Coagulation Studies: No results for input(s): LABPROT, INR in the last 72 hours.  Urinalysis: No results for input(s): COLORURINE, LABSPEC, PHURINE, GLUCOSEU, HGBUR, BILIRUBINUR, KETONESUR, PROTEINUR, UROBILINOGEN, NITRITE, LEUKOCYTESUR in the last 72 hours.  Invalid input(s): APPERANCEUR    Imaging: No results found.   Medications:   . diltiazem (CARDIZEM) infusion Stopped (08/23/15 1201)  . furosemide (LASIX) infusion 8 mg/hr (08/26/15 0024)   . albumin human  25 g Intravenous Q12H  . apixaban  5 mg Oral BID  . calcitRIOL  0.25 mcg Oral Q M,W,F  . cefTRIAXone (ROCEPHIN)  IV  1 g Intravenous Q24H  . diltiazem  120 mg Oral Daily  . ferrous sulfate  325 mg Oral Q breakfast  . letrozole  2.5 mg Oral Daily  . levothyroxine  25 mcg Oral QAC breakfast  . metoprolol  150 mg Oral BID  . sodium chloride flush  3 mL Intravenous Q12H  . sodium chloride flush  3 mL Intravenous Q12H   sodium chloride, acetaminophen **OR** acetaminophen, albuterol, hydrALAZINE, ondansetron **OR** ondansetron (ZOFRAN) IV, oxyCODONE-acetaminophen, sodium chloride flush  Assessment/ Plan:  81 y.o. female with atrial fibrillation, anemia, coronary artery disease, diabetes mellitus type 2, hypertension, hyperlipidemia, history of breast cancer, CKD stage IV/V who presents with increasing shortness of breath and edema.  1. Acute renal failure/chronic kidney disease stage IV/5.  Baseline EGFR 18 however EGFR has been lower than this in the past.  Patient has worsening renal function now. Unclear if this is progression to end-stage renal disease versus a true episode of acute renal failure.  -  Renal function not significantly changed. Creatinine  remains high at 4.4. She may actually have end-stage renal disease. We will continue with Lasix drip over the weekend. However if renal function remains low like this she will likely need to convert to hemodialysis, starting early next week.  2. Lower extremity edema and anasarca. Remains 3+ on exam today. Continue Lasix drip  for now   3. Anemia of chronic kidney disease. Patient was followed by Dr. Grayland Ormond as an outpatient. Hemoglobin currently low at 6.8.  As she will be transitioning most likely to hemodialysis we would recommend initiating Epogen with dialysis. - may need blood transfusion  4. Secondary hyperparathyroidism. Continue the patient calcitriol 0.25 g by mouth 3 times a week.  5. Hypokalemia.  Potassium remains low. Agree with administering potassium chloride prn    LOS: 5 Teresa Lutz 3/4/201712:09 PM

## 2015-08-28 LAB — CBC
HCT: 20.1 % — ABNORMAL LOW (ref 35.0–47.0)
Hemoglobin: 6.6 g/dL — ABNORMAL LOW (ref 12.0–16.0)
MCH: 28.8 pg (ref 26.0–34.0)
MCHC: 33 g/dL (ref 32.0–36.0)
MCV: 87.1 fL (ref 80.0–100.0)
PLATELETS: 277 10*3/uL (ref 150–440)
RBC: 2.31 MIL/uL — ABNORMAL LOW (ref 3.80–5.20)
RDW: 16.8 % — AB (ref 11.5–14.5)
WBC: 9.7 10*3/uL (ref 3.6–11.0)

## 2015-08-28 LAB — BASIC METABOLIC PANEL
ANION GAP: 16 — AB (ref 5–15)
BUN: 116 mg/dL — AB (ref 6–20)
CALCIUM: 10.7 mg/dL — AB (ref 8.9–10.3)
CO2: 23 mmol/L (ref 22–32)
CREATININE: 4.45 mg/dL — AB (ref 0.44–1.00)
Chloride: 100 mmol/L — ABNORMAL LOW (ref 101–111)
GFR calc Af Amer: 10 mL/min — ABNORMAL LOW (ref >60)
GFR, EST NON AFRICAN AMERICAN: 9 mL/min — AB (ref >60)
GLUCOSE: 129 mg/dL — AB (ref 65–99)
Potassium: 4 mmol/L (ref 3.5–5.1)
Sodium: 139 mmol/L (ref 135–145)

## 2015-08-28 LAB — MAGNESIUM: Magnesium: 2.1 mg/dL (ref 1.7–2.4)

## 2015-08-28 LAB — URIC ACID: URIC ACID, SERUM: 14.7 mg/dL — AB (ref 2.3–6.6)

## 2015-08-28 MED ORDER — PREDNISONE 50 MG PO TABS
50.0000 mg | ORAL_TABLET | Freq: Every day | ORAL | Status: DC
  Administered 2015-08-28 – 2015-08-31 (×4): 50 mg via ORAL
  Filled 2015-08-28 (×5): qty 1

## 2015-08-28 NOTE — Progress Notes (Signed)
[facility]  ROUNDING NOTE   Subjective:  Urine output ~ 500 cc today Patient continues on Lasix drip with no significant edema Potassium currently low at 4.0 Still has considerable lower extremity edema. S creatinine and BUN levels continue to worsen     Objective:  Vital signs in last 24 hours:  Temp:  [97.9 F (36.6 C)-98.2 F (36.8 C)] 98.2 F (36.8 C) (03/05 0405) Pulse Rate:  [88-104] 100 (03/05 0405) Resp:  [16-18] 16 (03/05 0405) BP: (142-154)/(81-102) 151/85 mmHg (03/05 0405) SpO2:  [98 %-99 %] 98 % (03/05 0405) Weight:  [94.802 kg (209 lb)] 94.802 kg (209 lb) (03/05 0405)  Weight change: -1.497 kg (-3 lb 4.8 oz) Filed Weights   08/26/15 0545 08/27/15 0536 08/28/15 0405  Weight: 95.89 kg (211 lb 6.4 oz) 96.299 kg (212 lb 4.8 oz) 94.802 kg (209 lb)    Intake/Output: I/O last 3 completed shifts: In: 170 [P.O.:170] Out: 500 [Urine:500]   Intake/Output this shift:  Total I/O In: -  Out: 100 [Urine:100]  Physical Exam: General: NAD, resting in bed  Head:  Moist oral mucosal membranes  Eyes: Anicteric   Neck: Supple, trachea midline  Lungs:  basiilar rales, normal effort  Heart: S1S2 irregular  Abdomen:  Soft, nontender, BS [resemt   Extremities: 3+ peripheral edema.  Neurologic: Nonfocal, moving all four extremities. Able to follow commands  Skin: No lesions       Basic Metabolic Panel:  Recent Labs Lab 08/22/15 1915  08/24/15 0854 08/24/15 1657 08/25/15 0609 08/26/15 0441 08/27/15 0615 08/28/15 0447  NA  --   < > 142  --  141 140 136 139  K  --   < > 3.0* 3.7 3.1* 3.4*  3.4* 3.3* 4.0  CL  --   < > 102  --  101 102 98* 100*  CO2  --   < > 26  --  26 27 24 23   GLUCOSE  --   < > 146*  --  134* 146* 145* 129*  BUN  --   < > 96*  --  97* 109* 103* 116*  CREATININE  --   < > 4.16*  --  4.33* 4.41* 4.37*  TEST WILL BE CREDITED 4.45*  CALCIUM  --   < > 10.0  --  10.2 10.2 10.2 10.7*  MG 2.3  --   --   --  2.1 2.2 2.1 2.1  PHOS  5.6*  --   --   --  4.8*  --  5.3*  --   < > = values in this interval not displayed.  Liver Function Tests:  Recent Labs Lab 08/22/15 1519  AST 14*  ALT 18  ALKPHOS 65  BILITOT 0.5  PROT 6.6  ALBUMIN 2.5*   No results for input(s): LIPASE, AMYLASE in the last 168 hours. No results for input(s): AMMONIA in the last 168 hours.  CBC:  Recent Labs Lab 08/22/15 1519 08/23/15 0636 08/25/15 0609 08/27/15 0615 08/28/15 0447  WBC 11.3* 9.2 11.9* 10.3 9.7  NEUTROABS 10.0*  --   --   --   --   HGB 8.9* 8.2* 7.2* 6.8* 6.6*  HCT 27.7* 25.6* 22.6* 20.8* 20.1*  MCV 88.8 88.0 87.4 87.2 87.1  PLT 327 284 267 296 277    Cardiac Enzymes:  Recent Labs Lab 08/22/15 1519  TROPONINI 0.08*    BNP: Invalid input(s): POCBNP  CBG:  Recent Labs Lab 08/22/15 1823 08/22/15 1947  GLUCAP 97  87    Microbiology: Results for orders placed or performed during the hospital encounter of 08/22/15  MRSA PCR Screening     Status: None   Collection Time: 08/22/15  6:36 PM  Result Value Ref Range Status   MRSA by PCR NEGATIVE NEGATIVE Final    Comment:        The GeneXpert MRSA Assay (FDA approved for NASAL specimens only), is one component of a comprehensive MRSA colonization surveillance program. It is not intended to diagnose MRSA infection nor to guide or monitor treatment for MRSA infections.   Urine culture     Status: None   Collection Time: 08/24/15 11:18 AM  Result Value Ref Range Status   Specimen Description URINE, RANDOM  Final   Special Requests NONE  Final   Culture >=100,000 COLONIES/mL KLEBSIELLA PNEUMONIAE  Final   Report Status 08/27/2015 FINAL  Final   Organism ID, Bacteria KLEBSIELLA PNEUMONIAE  Final      Susceptibility   Klebsiella pneumoniae - MIC*    AMPICILLIN RESISTANT Resistant     CEFAZOLIN <=4 SENSITIVE Sensitive     CEFTRIAXONE <=1 SENSITIVE Sensitive     CIPROFLOXACIN <=0.25 SENSITIVE Sensitive     GENTAMICIN <=1 SENSITIVE Sensitive      IMIPENEM <=0.25 SENSITIVE Sensitive     NITROFURANTOIN 64 INTERMEDIATE Intermediate     TRIMETH/SULFA <=20 SENSITIVE Sensitive     AMPICILLIN/SULBACTAM <=2 SENSITIVE Sensitive     PIP/TAZO <=4 SENSITIVE Sensitive     Extended ESBL NEGATIVE Sensitive     * >=100,000 COLONIES/mL KLEBSIELLA PNEUMONIAE    Coagulation Studies: No results for input(s): LABPROT, INR in the last 72 hours.  Urinalysis: No results for input(s): COLORURINE, LABSPEC, PHURINE, GLUCOSEU, HGBUR, BILIRUBINUR, KETONESUR, PROTEINUR, UROBILINOGEN, NITRITE, LEUKOCYTESUR in the last 72 hours.  Invalid input(s): APPERANCEUR    Imaging: No results found.   Medications:   . diltiazem (CARDIZEM) infusion Stopped (08/23/15 1201)  . furosemide (LASIX) infusion 8 mg/hr (08/26/15 0024)   . albumin human  25 g Intravenous Q12H  . cephALEXin  250 mg Oral Q12H  . diltiazem  120 mg Oral Daily  . ferrous sulfate  325 mg Oral Q breakfast  . heparin subcutaneous  5,000 Units Subcutaneous 3 times per day  . letrozole  2.5 mg Oral Daily  . levothyroxine  25 mcg Oral QAC breakfast  . metolazone  5 mg Oral Daily  . metoprolol  150 mg Oral BID  . sodium chloride flush  3 mL Intravenous Q12H  . sodium chloride flush  3 mL Intravenous Q12H   sodium chloride, acetaminophen **OR** acetaminophen, albuterol, hydrALAZINE, ondansetron **OR** ondansetron (ZOFRAN) IV, oxyCODONE-acetaminophen, sodium chloride flush  Assessment/ Plan:  81 y.o. female with atrial fibrillation, anemia, coronary artery disease, diabetes mellitus type 2, hypertension, hyperlipidemia, history of breast cancer, CKD stage IV/V who presents with increasing shortness of breath and edema.  1. Acute renal failure/chronic kidney disease stage IV/5.  Baseline EGFR 18 however EGFR has been lower than this in the past.  Patient has worsening renal function now. Unclear if this is progression to end-stage renal disease versus a true episode of acute renal failure.  -   Renal function not significantly changed. Creatinine remains high at 4.4. She may actually have end-stage renal disease. We will continue with Lasix drip over the weekend. However if renal function remains low like this she will likely need to convert to hemodialysis, starting early next week.  Discussed case with Patient's daughter Ms Adylin Hankey  by phone She wants patient o discuss case with Dr Juleen China tomorrow before making final decision re: starting dialysis   2. Lower extremity edema and anasarca. Remains 3+ on exam today.  Continue Lasix drip for now . May be stopped once dialysis is started  3. Anemia of chronic kidney disease. Patient was followed by Dr. Grayland Ormond as an outpatient. Hemoglobin currently low at 6.6.  As she will be transitioning most likely to hemodialysis we would recommend initiating Epogen with dialysis. - may need blood transfusion  4. Secondary hyperparathyroidism.  Hypercalcemia noted Discontinue calcitriol  5. Hypokalemia.     potassium chloride prn    LOS: 6 Teresa Lutz 3/5/201711:03 AM

## 2015-08-28 NOTE — Progress Notes (Signed)
[facility] NOTE  Pharmacy Consult for electrolyte monitoring  Indication: furosemide drip    Allergies  Allergen Reactions  . Celebrex [Celecoxib] Hives   Patient Measurements: Height: 5\' 7"  (170.2 cm) Weight: 209 lb (94.802 kg) IBW/kg (Calculated) : 61.6  Vital Signs: Temp: 98.2 F (36.8 C) (03/05 0405) BP: 151/85 mmHg (03/05 0405) Pulse Rate: 100 (03/05 0405) Intake/Output from previous day: 03/04 0701 - 03/05 0700 In: 120 [P.O.:120] Out: 500 [Urine:500] Intake/Output from this shift:   Vent settings for last 24 hours:    Labs:  Recent Labs  08/26/15 0441 08/27/15 0615 08/28/15 0447  WBC  --  10.3 9.7  HGB  --  6.8* 6.6*  HCT  --  20.8* 20.1*  PLT  --  296 277  CREATININE 4.41* 4.37*  TEST WILL BE CREDITED 4.45*  MG 2.2 2.1 2.1  PHOS  --  5.3*  --    Estimated Creatinine Clearance: 12.3 mL/min (by C-G formula based on Cr of 4.45).  No results for input(s): GLUCAP in the last 72 hours.  Assessment: Pharmacy consulted for electrolyte monitoring and management for 81 yo female currently ordered furosemide drip 8mg /hr.   All electrolytes within normal limits  Plan:  No supplementation warranted today.    Pharmacy will continue to monitor, thank you for the consult.  Larene Beach, PharmD   08/28/2015

## 2015-08-28 NOTE — Progress Notes (Signed)
     SUBJECTIVE: No complaints.   BP 151/85 mmHg  Pulse 100  Temp(Src) 98.2 F (36.8 C) (Oral)  Resp 16  Ht 5\' 7"  (1.702 m)  Wt 209 lb (94.802 kg)  BMI 32.73 kg/m2  SpO2 98%  Intake/Output Summary (Last 24 hours) at 08/28/15 0936 Last data filed at 08/28/15 [id number]  Gross per 24 hour  Intake    120 ml  Output    600 ml  Net   -480 ml    PHYSICAL EXAM General: Well developed, well nourished, in no acute distress. Alert and pleasant.   Psych:  Good affect, responds appropriately Neck: No JVD. No masses noted.  Lungs: Clear bilaterally with no wheezes or rhonci noted.  Heart: irreg irreg with no murmurs noted. Abdomen: Bowel sounds are present. Soft, non-tender.  Extremities: 2-3+ bilateral upper and lower extremity edema.   LABS: Basic Metabolic Panel:  Recent Labs  08/27/15 0615 08/28/15 0447  NA 136 139  K 3.3* 4.0  CL 98* 100*  CO2 24 23  GLUCOSE 145* 129*  BUN 103* 116*  CREATININE 4.37*  TEST WILL BE CREDITED 4.45*  CALCIUM 10.2 10.7*  MG 2.1 2.1  PHOS 5.3*  --    CBC:  Recent Labs  08/27/15 0615 08/28/15 0447  WBC 10.3 9.7  HGB 6.8* 6.6*  HCT 20.8* 20.1*  MCV 87.2 87.1  PLT 296 277   Current Meds: . albumin human  25 g Intravenous Q12H  . cephALEXin  250 mg Oral Q12H  . diltiazem  120 mg Oral Daily  . ferrous sulfate  325 mg Oral Q breakfast  . heparin subcutaneous  5,000 Units Subcutaneous 3 times per day  . letrozole  2.5 mg Oral Daily  . levothyroxine  25 mcg Oral QAC breakfast  . metolazone  5 mg Oral Daily  . metoprolol  150 mg Oral BID  . sodium chloride flush  3 mL Intravenous Q12H  . sodium chloride flush  3 mL Intravenous Q12H     ASSESSMENT AND PLAN:  1. Acute on Chronic Diastolic CHF: She remains volume overloaded with upper and lower ext edema. Weight and volume overload overall unchanged with attempted diuresis. Nephrology started IV Lasix drip but she has diuresed poorly. Likely that HD will be required. The Lasix drip is  being managed by the Nephrology team.   2. Atrial fibrillation, persistent: She presented in atrial fibrillation with RVR, HR in the 140's. Likely secondary to her significant volume overload. She is now rate controlled on po Cardizem. CHA2DS2-VASc Score is 5. Will continue Eliquis for anticoagulation. No new recs today.   3. Stage 4 CKD: followed by Nephrology.   4. HTN: BP stable.  5. Elevated Troponin: Troponin 0.08 on admission. Likely secondary to demand ischemia in the setting of CHF/AF with RVR/anemia/renal failure. Low risk stress myoview in 03/2015 (no ischemia). Given renal failure and recent neg study, would not pursue further ischemic evaluation at this time.  6. Anemia: H/H continues to fall, Hbg 6.6 this am. Will likely need transfusion pRBCs today. Management per primary team   Layman Gully  3/5/20179:36 AM

## 2015-08-28 NOTE — Progress Notes (Signed)
Room air. A fib. Pt reports no pain. Hand is very swollen and md was notified. Pt has been using bedpan. A & O. Takes meds ok. Poor PO intake. Fluid restricted. Pt has no further concerns at this time.

## 2015-08-28 NOTE — Progress Notes (Signed)
[facility] [place] NAME: Teresa Lutz    MR#:  [id number]  DATE OF BIRTH:  07-31-1934  SUBJECTIVE:  Remains somnolent, uncomfortable . C/o pain in hands, feet, inability to eat.   REVIEW OF SYSTEMS:  CONSTITUTIONAL: No fever, fatigue or weakness.  EYES: No blurred or double vision.  EARS, NOSE, AND THROAT: No tinnitus or ear pain.  RESPIRATORY: No cough, shortness of breath, wheezing or hemoptysis.  CARDIOVASCULAR: No chest pain, positive orthopnea, positive edema.  GASTROINTESTINAL: No nausea, vomiting, diarrhea or abdominal pain.  GENITOURINARY: No dysuria, hematuria.  ENDOCRINE: No polyuria, nocturia,  HEMATOLOGY: No anemia, easy bruising or bleeding SKIN: No rash or lesion. MUSCULOSKELETAL: No joint pain or arthritis.   NEUROLOGIC: No tingling, numbness, weakness.  PSYCHIATRY: No anxiety or depression.   DRUG ALLERGIES:   Allergies  Allergen Reactions  . Celebrex [Celecoxib] Hives    VITALS:  Blood pressure 132/71, pulse 101, temperature 97.9 F (36.6 C), temperature source Oral, resp. rate 18, height 5\' 7"  (1.702 m), weight 94.802 kg (209 lb), SpO2 99 %.  PHYSICAL EXAMINATION:  VITAL SIGNS: Filed Vitals:   08/28/15 0405 08/28/15 1212  BP: 151/85 132/71  Pulse: 100 101  Temp: 98.2 F (36.8 C) 97.9 F (36.6 C)  Resp: 16 40   GENERAL:81 y.o.female currently in no acute distress.  HEAD: Normocephalic, atraumatic.  EYES: Pupils equal, round, reactive to light. Extraocular muscles intact. No scleral icterus.  MOUTH: Moist mucosal membrane. Dentition intact. No abscess noted.  EAR, NOSE, THROAT: Clear without exudates. No external lesions.  NECK: Supple. No thyromegaly. No nodules. No JVD.  PULMONARY: Diminished without wheeze rails or rhonci. No use of accessory muscles, Good respiratory effort. good air entry bilaterally CHEST: Nontender to palpation.  CARDIOVASCULAR: S1 and S2. Irregular rate and rhythm. No  murmurs, rubs, or gallops. 3+ edema. Pedal pulses 2+ bilaterally.  GASTROINTESTINAL: Soft, nontender, nondistended. No masses. Positive bowel sounds. No hepatosplenomegaly.  MUSCULOSKELETAL: 3 plus leg and feet , arm swelling, no clubbing, cyanosis. Range of motion decreased in all extremities.  NEUROLOGIC: Cranial nerves II through XII are intact. No gross focal neurological deficits. Sensation intact. Reflexes intact.  SKIN: No ulceration, lesions, rashes, or cyanosis. Skin warm and dry. Turgor intact.  PSYCHIATRIC: Mood, affect blunted, somnolent, sitting up, chatting   LABORATORY PANEL:   CBC  Recent Labs Lab 08/28/15 0447  WBC 9.7  HGB 6.6*  HCT 20.1*  PLT 277   ------------------------------------------------------------------------------------------------------------------  Chemistries   Recent Labs Lab 08/22/15 1519  08/28/15 0447  NA 143  < > 139  K 3.7  < > 4.0  CL 104  < > 100*  CO2 24  < > 23  GLUCOSE 135*  < > 129*  BUN 85*  < > 116*  CREATININE 4.19*  < > 4.45*  CALCIUM 9.9  < > 10.7*  MG  --   < > 2.1  AST 14*  --   --   ALT 18  --   --   ALKPHOS 65  --   --   BILITOT 0.5  --   --   < > = values in this interval not displayed. ------------------------------------------------------------------------------------------------------------------  Cardiac Enzymes  Recent Labs Lab 08/22/15 1519  TROPONINI 0.08*   ------------------------------------------------------------------------------------------------------------------  RADIOLOGY:  No results found.  EKG:   Orders placed or performed during the hospital encounter of 08/22/15  . ED EKG  . ED EKG    ASSESSMENT AND  PLAN:   81 year old African female history of atrial fibrillation, chronic kidney disease stage V, congestive heart failure ejection fraction 60-65 presenting with worsening edema  1. Acute on chronic diastolic congestive heart failure: Cardiology nephrology input appreciated,  continue diuresis with IV Lasix drip, following urine output,  renal function, DuoNeb treatments as required, oxygen as required, remains on RA, urinary output 500 ml, ins/outs negative by about 280 ml since admission 2. Atrial fibrillation rapid ventricular response: Cardizem, metoprolol, stabilized 3. Acute kidney injury on chronic kidney disease:  will require dialysis, appreciate nephrologist input, early hemodialysis catheter placement tomorrow, discussed with nephrology 4. Hypokalemia: Replacing goal 3.5-4.0, Mg level is normal 5. Urinary tract infection site unspecified due to klebsiella pneumoniae: Continue keflex po 6. Hypothyroidism unspecified: Synthroid 7. Hypertension essential: Lopressor, cardizem, off Benicar due to renal failure, blood pressure is well controlled 8. Venous thromboembolism prophylactic: off Eliquis , continue heparin SQ 9 metabolic vs uremic encephalopathy, stable/improved, following 10 hand pain,? Etiology, possibly uremia, following, may start gabapentin if no improvement with HD, get uric acid level 11 anemia, likely of chronic disease, per iron studies, transfuse after hemodialysis is initiated 12. Generalized weakness, physical therapist evaluation is still pending Disposition:  physical therapy evaluation   All the records are reviewed and case discussed with Care Management/Social Workerr. Management plans discussed with the patient, family and they are in agreement.  CODE STATUS: Full  TOTAL TIME TAKING CARE OF THIS PATIENT: 40 minutes.  About 10 minutes was spent discussing with patient's daughter POSSIBLE D/C IN 2-3 DAYS, DEPENDING ON CLINICAL CONDITION.   Theodoro Grist M.D on 08/28/2015 at 3:24 PM  Between 7am to 6pm - Pager - [phone]  After 6pm: House Pager: - [phone]  [facility]  Office  [phone]  CC: Primary care physician; Casilda Carls, MD

## 2015-08-28 NOTE — Progress Notes (Signed)
MD vaickute was notified of R hand swollen and uric acid 14.7. Prednisone 50mg  daily PO.

## 2015-08-29 ENCOUNTER — Encounter
Admission: EM | Disposition: A | Discharge status: Skilled Nursing Facility | Payer: Self-pay | Source: Home / Self Care | Attending: Internal Medicine

## 2015-08-29 ENCOUNTER — Encounter: Payer: Self-pay | Admitting: *Deleted

## 2015-08-29 HISTORY — PX: PERIPHERAL VASCULAR CATHETERIZATION: SHX172C

## 2015-08-29 LAB — BASIC METABOLIC PANEL
Anion gap: 18 — ABNORMAL HIGH (ref 5–15)
BUN: 123 mg/dL — AB (ref 6–20)
CALCIUM: 10.8 mg/dL — AB (ref 8.9–10.3)
CO2: 22 mmol/L (ref 22–32)
Chloride: 98 mmol/L — ABNORMAL LOW (ref 101–111)
Creatinine, Ser: 4.6 mg/dL — ABNORMAL HIGH (ref 0.44–1.00)
GFR calc Af Amer: 10 mL/min — ABNORMAL LOW (ref >60)
GFR, EST NON AFRICAN AMERICAN: 8 mL/min — AB (ref >60)
Glucose, Bld: 157 mg/dL — ABNORMAL HIGH (ref 65–99)
POTASSIUM: 3.7 mmol/L (ref 3.5–5.1)
SODIUM: 138 mmol/L (ref 135–145)

## 2015-08-29 LAB — CBC
HEMATOCRIT: 22.1 % — AB (ref 35.0–47.0)
HEMOGLOBIN: 7.2 g/dL — AB (ref 12.0–16.0)
MCH: 28.2 pg (ref 26.0–34.0)
MCHC: 32.5 g/dL (ref 32.0–36.0)
MCV: 86.6 fL (ref 80.0–100.0)
Platelets: 341 10*3/uL (ref 150–440)
RBC: 2.55 MIL/uL — ABNORMAL LOW (ref 3.80–5.20)
RDW: 16.7 % — ABNORMAL HIGH (ref 11.5–14.5)
WBC: 12 10*3/uL — ABNORMAL HIGH (ref 3.6–11.0)

## 2015-08-29 LAB — DIFFERENTIAL
Basophils Absolute: 0 10*3/uL (ref 0–0.1)
Basophils Relative: 0 %
Eosinophils Absolute: 0 10*3/uL (ref 0–0.7)
Eosinophils Relative: 0 %
Lymphocytes Relative: 1 %
Lymphs Abs: 0.2 10*3/uL — ABNORMAL LOW (ref 1.0–3.6)
Monocytes Absolute: 0.2 10*3/uL (ref 0.2–0.9)
Monocytes Relative: 2 %
Neutro Abs: 11.6 10*3/uL — ABNORMAL HIGH (ref 1.4–6.5)
Neutrophils Relative %: 97 %

## 2015-08-29 LAB — RENAL FUNCTION PANEL
ANION GAP: 20 — AB (ref 5–15)
Albumin: 3.8 g/dL (ref 3.5–5.0)
BUN: 128 mg/dL — ABNORMAL HIGH (ref 6–20)
CO2: 22 mmol/L (ref 22–32)
Calcium: 10.5 mg/dL — ABNORMAL HIGH (ref 8.9–10.3)
Chloride: 94 mmol/L — ABNORMAL LOW (ref 101–111)
Creatinine, Ser: 4.6 mg/dL — ABNORMAL HIGH (ref 0.44–1.00)
GFR calc Af Amer: 10 mL/min — ABNORMAL LOW (ref >60)
GFR calc non Af Amer: 8 mL/min — ABNORMAL LOW (ref >60)
GLUCOSE: 229 mg/dL — AB (ref 65–99)
POTASSIUM: 3.6 mmol/L (ref 3.5–5.1)
Phosphorus: 8 mg/dL — ABNORMAL HIGH (ref 2.5–4.6)
Sodium: 136 mmol/L (ref 135–145)

## 2015-08-29 LAB — MAGNESIUM: MAGNESIUM: 2.2 mg/dL (ref 1.7–2.4)

## 2015-08-29 LAB — PREPARE RBC (CROSSMATCH)

## 2015-08-29 LAB — ABO/RH: ABO/RH(D): O POS

## 2015-08-29 SURGERY — DIALYSIS/PERMA CATHETER INSERTION
Anesthesia: Moderate Sedation

## 2015-08-29 MED ORDER — SODIUM CHLORIDE 0.9 % IV SOLN
Freq: Once | INTRAVENOUS | Status: AC
  Administered 2015-08-29: 20:00:00 via INTRAVENOUS

## 2015-08-29 MED ORDER — POLYETHYLENE GLYCOL 3350 17 G PO PACK
17.0000 g | PACK | Freq: Every day | ORAL | Status: DC
  Administered 2015-08-29 – 2015-09-08 (×9): 17 g via ORAL
  Filled 2015-08-29 (×9): qty 1

## 2015-08-29 MED ORDER — SENNOSIDES-DOCUSATE SODIUM 8.6-50 MG PO TABS
2.0000 | ORAL_TABLET | Freq: Two times a day (BID) | ORAL | Status: DC
  Administered 2015-08-29 – 2015-09-08 (×16): 2 via ORAL
  Filled 2015-08-29 (×17): qty 2

## 2015-08-29 MED ORDER — HEPARIN SODIUM (PORCINE) 10000 UNIT/ML IJ SOLN
INTRAMUSCULAR | Status: AC
  Filled 2015-08-29: qty 1

## 2015-08-29 MED ORDER — LIDOCAINE-EPINEPHRINE (PF) 1 %-1:200000 IJ SOLN
INTRAMUSCULAR | Status: AC
  Filled 2015-08-29: qty 30

## 2015-08-29 MED ORDER — MIDAZOLAM HCL 2 MG/2ML IJ SOLN
INTRAMUSCULAR | Status: DC | PRN
  Administered 2015-08-29: 1 mg via INTRAVENOUS

## 2015-08-29 MED ORDER — HEPARIN (PORCINE) IN NACL 2-0.9 UNIT/ML-% IJ SOLN
INTRAMUSCULAR | Status: AC
  Filled 2015-08-29: qty 500

## 2015-08-29 MED ORDER — DEXTROSE 5 % IV SOLN
1.5000 g | Freq: Once | INTRAVENOUS | Status: AC
  Administered 2015-08-29: 1.5 g via INTRAVENOUS

## 2015-08-29 MED ORDER — MIDAZOLAM HCL 5 MG/5ML IJ SOLN
INTRAMUSCULAR | Status: AC
  Filled 2015-08-29: qty 5

## 2015-08-29 MED ORDER — TUBERCULIN PPD 5 UNIT/0.1ML ID SOLN
5.0000 [IU] | Freq: Once | INTRADERMAL | Status: AC
  Administered 2015-08-29: 5 [IU] via INTRADERMAL
  Filled 2015-08-29: qty 0.1

## 2015-08-29 SURGICAL SUPPLY — 3 items
CATH PALINDROME RT-P 15FX19CM (CATHETERS) ×3 IMPLANT
PACK ANGIOGRAPHY (CUSTOM PROCEDURE TRAY) ×3 IMPLANT
TOWEL OR 17X26 4PK STRL BLUE (TOWEL DISPOSABLE) ×3 IMPLANT

## 2015-08-29 NOTE — Progress Notes (Signed)
[facility]  ROUNDING NOTE   Subjective:   Patient on furosemide gtt with minimal improvement in peripheral edema and now with critical level of BUN 123 and creatinine of 4.6, eGFR of 10.    Objective:  Vital signs in last 24 hours:  Temp:  [97.8 F (36.6 C)-98.2 F (36.8 C)] 97.8 F (36.6 C) (03/06 1342) Pulse Rate:  [73-114] 73 (03/06 1342) Resp:  [16-18] 18 (03/06 1227) BP: (136-163)/(79-93) 139/79 mmHg (03/06 1342) SpO2:  [97 %-100 %] 99 % (03/06 1342) Weight:  [93.947 kg (207 lb 1.9 oz)] 93.947 kg (207 lb 1.9 oz) (03/06 0420)  Weight change: -0.855 kg (-1 lb 14.2 oz) Filed Weights   08/27/15 0536 08/28/15 0405 08/29/15 0420  Weight: 96.299 kg (212 lb 4.8 oz) 94.802 kg (209 lb) 93.947 kg (207 lb 1.9 oz)    Intake/Output: I/O last 3 completed shifts: In: 32 [P.O.:240; I.V.:96; IV Piggyback:500] Out: 1400 [Urine:1400]   Intake/Output this shift:  Total I/O In: 120 [P.O.:120] Out: 600 [Urine:600]  Physical Exam: General: NAD, resting in bed  Head:  Moist oral mucosal membranes  Eyes: Anicteric   Neck: Supple, trachea midline  Lungs:  basiilar rales, normal effort  Heart: S1S2 irregular  Abdomen:  Soft, nontender, BS [resemt   Extremities: 3+ peripheral edema.  Neurologic: Nonfocal, moving all four extremities. Able to follow commands  Skin: No lesions       Basic Metabolic Panel:  Recent Labs Lab 08/22/15 1915  08/25/15 0609 08/26/15 0441 08/27/15 0615 08/28/15 0447 08/29/15 0605  NA  --   < > 141 140 136 139 138  K  --   < > 3.1* 3.4*  3.4* 3.3* 4.0 3.7  CL  --   < > 101 102 98* 100* 98*  CO2  --   < > 26 27 24 23 22   GLUCOSE  --   < > 134* 146* 145* 129* 157*  BUN  --   < > 97* 109* 103* 116* 123*  CREATININE  --   < > 4.33* 4.41* 4.37*  TEST WILL BE CREDITED 4.45* 4.60*  CALCIUM  --   < > 10.2 10.2 10.2 10.7* 10.8*  MG 2.3  --  2.1 2.2 2.1 2.1 2.2  PHOS 5.6*  --  4.8*  --  5.3*  --   --   < > = values in this interval not  displayed.  Liver Function Tests:  Recent Labs Lab 08/22/15 1519  AST 14*  ALT 18  ALKPHOS 65  BILITOT 0.5  PROT 6.6  ALBUMIN 2.5*   No results for input(s): LIPASE, AMYLASE in the last 168 hours. No results for input(s): AMMONIA in the last 168 hours.  CBC:  Recent Labs Lab 08/22/15 1519 08/23/15 0636 08/25/15 0609 08/27/15 0615 08/28/15 0447  WBC 11.3* 9.2 11.9* 10.3 9.7  NEUTROABS 10.0*  --   --   --   --   HGB 8.9* 8.2* 7.2* 6.8* 6.6*  HCT 27.7* 25.6* 22.6* 20.8* 20.1*  MCV 88.8 88.0 87.4 87.2 87.1  PLT 327 284 267 296 277    Cardiac Enzymes:  Recent Labs Lab 08/22/15 1519  TROPONINI 0.08*    BNP: Invalid input(s): POCBNP  CBG:  Recent Labs Lab 08/22/15 1823 08/22/15 1947  GLUCAP 97 87    Microbiology: Results for orders placed or performed during the hospital encounter of 08/22/15  MRSA PCR Screening     Status: None   Collection Time: 08/22/15  6:36  PM  Result Value Ref Range Status   MRSA by PCR NEGATIVE NEGATIVE Final    Comment:        The GeneXpert MRSA Assay (FDA approved for NASAL specimens only), is one component of a comprehensive MRSA colonization surveillance program. It is not intended to diagnose MRSA infection nor to guide or monitor treatment for MRSA infections.   Urine culture     Status: None   Collection Time: 08/24/15 11:18 AM  Result Value Ref Range Status   Specimen Description URINE, RANDOM  Final   Special Requests NONE  Final   Culture >=100,000 COLONIES/mL KLEBSIELLA PNEUMONIAE  Final   Report Status 08/27/2015 FINAL  Final   Organism ID, Bacteria KLEBSIELLA PNEUMONIAE  Final      Susceptibility   Klebsiella pneumoniae - MIC*    AMPICILLIN RESISTANT Resistant     CEFAZOLIN <=4 SENSITIVE Sensitive     CEFTRIAXONE <=1 SENSITIVE Sensitive     CIPROFLOXACIN <=0.25 SENSITIVE Sensitive     GENTAMICIN <=1 SENSITIVE Sensitive     IMIPENEM <=0.25 SENSITIVE Sensitive     NITROFURANTOIN 64 INTERMEDIATE  Intermediate     TRIMETH/SULFA <=20 SENSITIVE Sensitive     AMPICILLIN/SULBACTAM <=2 SENSITIVE Sensitive     PIP/TAZO <=4 SENSITIVE Sensitive     Extended ESBL NEGATIVE Sensitive     * >=100,000 COLONIES/mL KLEBSIELLA PNEUMONIAE    Coagulation Studies: No results for input(s): LABPROT, INR in the last 72 hours.  Urinalysis: No results for input(s): COLORURINE, LABSPEC, PHURINE, GLUCOSEU, HGBUR, BILIRUBINUR, KETONESUR, PROTEINUR, UROBILINOGEN, NITRITE, LEUKOCYTESUR in the last 72 hours.  Invalid input(s): APPERANCEUR    Imaging: No results found.   Medications:   . diltiazem (CARDIZEM) infusion Stopped (08/23/15 1201)   . albumin human  25 g Intravenous Q12H  . cephALEXin  250 mg Oral Q12H  . diltiazem  120 mg Oral Daily  . ferrous sulfate  325 mg Oral Q breakfast  . heparin subcutaneous  5,000 Units Subcutaneous 3 times per day  . letrozole  2.5 mg Oral Daily  . levothyroxine  25 mcg Oral QAC breakfast  . metoprolol  150 mg Oral BID  . predniSONE  50 mg Oral Q breakfast  . sodium chloride flush  3 mL Intravenous Q12H  . tuberculin  5 Units Intradermal Once   sodium chloride, acetaminophen **OR** acetaminophen, albuterol, hydrALAZINE, ondansetron **OR** ondansetron (ZOFRAN) IV, oxyCODONE-acetaminophen, sodium chloride flush  Assessment/ Plan:  81 y.o. female with atrial fibrillation, anemia, coronary artery disease, diabetes mellitus type 2, hypertension, hyperlipidemia, history of breast cancer, CKD stage IV/V who presents with increasing shortness of breath and edema.  1. Chronic kidney disease stage V: now End stage renal disease:  - discontinue furosemide gtt.  - schedule patient for first hemodialysis treatment today. - Consult vascular surgery for permcath placement. AVF is scheduled to be placed on 3/8, hopefully this can still happen while patient is admitted - Start discharge planning.   2. Hypertension and Diastolic Congestive Heart Failure with lower  extremity edema and anasarca. Remains 3+ on exam today. Echocardiogram from 05/02/15.  - will attempt ultrafiltration with hemodialysis today.   3. Anemia of chronic kidney disease. Patient was followed by Dr. Grayland Ormond as an outpatient. Hemoglobin currently low at 6.6.   - EPO with HD treatment.   4. Secondary hyperparathyroidism: with hypercalcemia: phosphorus at goal.  - Check PTH - now holding calcitriol.    LOS: 7 Kasper Mudrick 3/6/20171:59 PM

## 2015-08-29 NOTE — Care Management Important Message (Signed)
Important Message  Patient Details  Name: Teresa Lutz MRN: [id number] Date of Birth: 03/07/1935   Medicare Important Message Given:  Yes    Juliann Pulse A Mikeala Girdler 08/29/2015, 10:25 AM

## 2015-08-29 NOTE — H&P (Signed)
  [facility] History & Physical Update  The patient was interviewed and re-examined.  The patient's previous History and Physical has been reviewed and is unchanged.  There is no change in the plan of care. We plan to proceed with the scheduled procedure.  DEW,JASON, MD  08/29/2015, 11:34 AM

## 2015-08-29 NOTE — Consult Note (Signed)
[facility] Vascular Consult Note  MRN : [id number]  Teresa Lutz is a 81 y.o. ([birth year]) female who presents with chief complaint of  Chief Complaint  Patient presents with  . Leg Swelling  . Tachycardia  .  History of Present Illness: Patient admitted with worsening CKD as well as cardiac issues.  Her renal function has been poor and has had gradual decline over several years.  Asked by Dr. Juleen China of nephrology to see the patient now and place a Permcath.  Outpatient workup for long term dialysis access previously, and she will still need that. She is quite anemic and short of breath with minimal activity.  Also having a lot of leg swelling and volume overload issues.    Current Facility-Administered Medications  Medication Dose Route Frequency Provider Last Rate Last Dose  . 0.9 %  sodium chloride infusion  250 mL Intravenous PRN Demetrios Loll, MD 10 mL/hr at 08/23/15 1500 250 mL at 08/23/15 1500  . acetaminophen (TYLENOL) tablet 650 mg  650 mg Oral Q6H PRN Demetrios Loll, MD   650 mg at 08/22/15 2114   Or  . acetaminophen (TYLENOL) suppository 650 mg  650 mg Rectal Q6H PRN Demetrios Loll, MD      . albumin human 25 % solution 25 g  25 g Intravenous Q12H Munsoor Lateef, MD   25 g at 08/28/15 2042  . albuterol (PROVENTIL) (2.5 MG/3ML) 0.083% nebulizer solution 2.5 mg  2.5 mg Nebulization Q2H PRN Demetrios Loll, MD      . cephALEXin (KEFLEX) 250 MG/5ML suspension 250 mg  250 mg Oral Q12H Theodoro Grist, MD   250 mg at 08/28/15 2042  . diltiazem (CARDIZEM CD) 24 hr capsule 120 mg  120 mg Oral Daily Demetrios Loll, MD   120 mg at 08/29/15 0857  . diltiazem (CARDIZEM) 100 mg in dextrose 5 % 100 mL (1 mg/mL) infusion  5-15 mg/hr Intravenous Titrated Earleen Newport, MD   Stopped at 08/23/15 1201  . ferrous sulfate tablet 325 mg  325 mg Oral Q breakfast Demetrios Loll, MD   325 mg at 08/29/15 0857  . heparin injection 5,000 Units  5,000 Units Subcutaneous 3 times per day Theodoro Grist, MD    5,000 Units at 08/29/15 0508  . hydrALAZINE (APRESOLINE) injection 10 mg  10 mg Intravenous Q4H PRN Saundra Shelling, MD   10 mg at 08/27/15 0627  . letrozole [facility]) tablet 2.5 mg  2.5 mg Oral Daily Demetrios Loll, MD   2.5 mg at 08/29/15 0857  . levothyroxine (SYNTHROID, LEVOTHROID) tablet 25 mcg  25 mcg Oral QAC breakfast Demetrios Loll, MD   25 mcg at 08/29/15 [phone]  . metoprolol (LOPRESSOR) tablet 150 mg  150 mg Oral BID Demetrios Loll, MD   150 mg at 08/29/15 0857  . ondansetron (ZOFRAN) tablet 4 mg  4 mg Oral Q6H PRN Demetrios Loll, MD       Or  . ondansetron [facility]) injection 4 mg  4 mg Intravenous Q6H PRN Demetrios Loll, MD      . oxyCODONE-acetaminophen (PERCOCET/ROXICET) 5-325 MG per tablet 1 tablet  1 tablet Oral Q6H PRN Flora Lipps, MD   1 tablet at 08/23/15 1201  . predniSONE (DELTASONE) tablet 50 mg  50 mg Oral Q breakfast Theodoro Grist, MD   50 mg at 08/29/15 0857  . sodium chloride flush (NS) 0.9 % injection 3 mL  3 mL Intravenous Q12H Demetrios Loll, MD   3 mL at 08/29/15 1000  .  sodium chloride flush (NS) 0.9 % injection 3 mL  3 mL Intravenous PRN Demetrios Loll, MD        Past Medical History  Diagnosis Date  . Arthritis 1990  . Breast cancer ([place]) 2012    Right stage 1  . Ulcer 1980  . Malignant neoplasm of upper-inner quadrant of female breast ([place])     right  . Hemorrhoid   . Unspecified vitamin D deficiency   . Anemia of chronic disease   . Hypopotassemia   . Unspecified hypothyroidism   . Goiter, specified as simple   . Hyperlipidemia   . CKD (chronic kidney disease), stage IV ([place])   . Hypertension 1960  . Chronic atrial fibrillation ([place]) 2014    a. Eliquis 5 mg bid; CHADSVASc 5 (CHF, HTN, age x 2, female)  . Lower extremity edema   . Chronic diastolic CHF (congestive heart failure) (H[facility])     a. EF 60-65% by echo in 04/2015  . Pulmonary HTN ([place])     a. PA peak pressure 41 mmHg by echo in 04/2015    Past Surgical History  Procedure Laterality Date  . Mammosite balloon placement  Right  11/2010  . Colonoscopy  2011  . Breast lumpectomy Right 50 years ago  . Breast lumpectomy Right 2012    invasive   . Cardiac catheterization  06/2010    [facility]: Mild nonobstructive coronary artery disease with normal ejection fraction.  . Cardioversion      Social History Social History  Substance Use Topics  . Smoking status: Never Smoker   . Smokeless tobacco: Never Used  . Alcohol Use: No  No IVDU  Family History Family History  Problem Relation Age of Onset  . Cancer Mother     ovarian  . Hypertension Mother   . Cancer Brother     colon  . Stroke Father     Allergies  Allergen Reactions  . Celebrex [Celecoxib] Hives     REVIEW OF SYSTEMS (Negative unless checked)  Constitutional: [] Weight loss  [] Fever  [] Chills Cardiac: [] Chest pain   [] Chest pressure   [x] Palpitations   [] Shortness of breath when laying flat   [] Shortness of breath at rest   [x] Shortness of breath with exertion. Vascular:  [] Pain in legs with walking   [] Pain in legs at rest   [] Pain in legs when laying flat   [] Claudication   [] Pain in feet when walking  [] Pain in feet at rest  [] Pain in feet when laying flat   [] History of DVT   [] Phlebitis   [x] Swelling in legs   [] Varicose veins   [] Non-healing ulcers Pulmonary:   [] Uses home oxygen   [] Productive cough   [] Hemoptysis   [] Wheeze  [] COPD   [] Asthma Neurologic:  [] Dizziness  [] Blackouts   [] Seizures   [] History of stroke   [] History of TIA  [] Aphasia   [] Temporary blindness   [] Dysphagia   [] Weakness or numbness in arms   [] Weakness or numbness in legs Musculoskeletal:  [] Arthritis   [] Joint swelling   [] Joint pain   [] Low back pain Hematologic:  [] Easy bruising  [] Easy bleeding   [] Hypercoagulable state   [x] Anemic  [] Hepatitis Gastrointestinal:  [] Blood in stool   [] Vomiting blood  [] Gastroesophageal reflux/heartburn   [] Difficulty swallowing. Genitourinary:  [x] Chronic kidney disease   [] Difficult urination  [] Frequent urination  [] Burning with  urination   [] Blood in urine Skin:  [] Rashes   [] Ulcers   [] Wounds Psychological:  [] History of anxiety   []   History of major depression.  Physical Examination  Filed Vitals:   08/28/15 0405 08/28/15 1212 08/28/15 1937 08/29/15 0420  BP: 151/85 132/71 163/85 136/81  Pulse: 100 101 114 96  Temp: 98.2 F (36.8 C) 97.9 F (36.6 C) 98.1 F (36.7 C) 98.2 F (36.8 C)  TempSrc:  Oral Oral Oral  Resp: 16 18 16 16   Height:      Weight: 94.802 kg (209 lb)   93.947 kg (207 lb 1.9 oz)  SpO2: 98% 99% 97% 100%   Body mass index is 32.43 kg/(m^2). Gen:  WD/WN, NAD Head: [place]/AT, No temporalis wasting. Prominent temp pulse not noted. Ear/Nose/Throat: Hearing grossly intact, nares w/o erythema or drainage, oropharynx w/o Erythema/Exudate Eyes: PERRLA, EOMI.  Neck: Supple, no nuchal rigidity.  No JVD.  Pulmonary:  Good air movement, equal bilaterally.  Cardiac: RRR, normal S1, S2. Vascular:  Vessel Right Left  Radial Palpable Palpable  Ulnar Palpable Palpable  Brachial Palpable Palpable  Carotid Palpable, without bruit Palpable, without bruit  Aorta Not palpable N/A  Femoral Palpable Palpable  Popliteal Not Palpable Not Palpable  PT Not Palpable Not Palpable  DP Palpable Palpable   Gastrointestinal: soft, non-tender/non-distended. No guarding/reflex. No masses, surgical incisions, or scars. Musculoskeletal: M/S 5/5 throughout.  Extremities without ischemic changes.  No deformity or atrophy. Moderate LE edema. Neurologic: CN 2-12 intact. Pain and light touch intact in extremities.  Symmetrical.  Speech is fluent. Motor exam as listed above. Psychiatric: Judgment intact, Mood & affect appropriate for pt's clinical situation. Dermatologic: No rashes or ulcers noted.  No cellulitis or open wounds. Lymph : No Cervical, Axillary, or Inguinal lymphadenopathy.   CBC Lab Results  Component Value Date   WBC 9.7 08/28/2015   HGB 6.6* 08/28/2015   HCT 20.1* 08/28/2015   MCV 87.1 08/28/2015    PLT 277 08/28/2015    BMET    Component Value Date/Time   NA 138 08/29/2015 0605   NA 144 03/24/2015 1403   NA 139 03/18/2014 1504   K 3.7 08/29/2015 0605   K 2.8* 03/18/2014 1504   CL 98* 08/29/2015 0605   CL 100 03/18/2014 1504   CO2 22 08/29/2015 0605   CO2 30 03/18/2014 1504   GLUCOSE 157* 08/29/2015 0605   GLUCOSE 138* 03/24/2015 1403   GLUCOSE 138* 03/18/2014 1504   BUN 123* 08/29/2015 0605   BUN 65* 03/24/2015 1403   BUN 74* 03/18/2014 1504   CREATININE 4.60* 08/29/2015 0605   CREATININE 3.67* 03/18/2014 1504   CALCIUM 10.8* 08/29/2015 0605   CALCIUM 9.8 03/18/2014 1504   GFRNONAA 8* 08/29/2015 0605   GFRNONAA 13* 03/18/2014 1504   GFRAA 10* 08/29/2015 0605   GFRAA 15* 03/18/2014 1504   Estimated Creatinine Clearance: 11.9 mL/min (by C-G formula based on Cr of 4.6).  COAG Lab Results  Component Value Date   INR 1.1 10/27/2013    Radiology Dg Chest 2 View  08/01/2015  CLINICAL DATA:  Atrial fibrillation.  Diabetes . EXAM: CHEST  2 VIEW COMPARISON:  01/23/2008. FINDINGS: Mediastinum hilar structures normal. Right base subsegmental atelectasis and/or scarring. No pleural effusion pneumothorax. Cardiomegaly. No acute bony abnormality. IMPRESSION: Right base subsegmental atelectasis and/or scarring. Electronically Signed   By: Marcello Moores  Register   On: 08/01/2015 11:44   US Renal  08/23/2015  CLINICAL DATA:  Acute renal failure. EXAM: RENAL / URINARY TRACT ULTRASOUND COMPLETE COMPARISON:  None. FINDINGS: Right Kidney: Length: 10.3 cm. Mild increased cortical echogenicity. No hydronephrosis. 1.8 cm cyst over the lower  pole. Left Kidney: Length: 10.5 cm. Increased cortical echogenicity. No hydronephrosis. 4 cm cyst over the mid pole. Bladder: Appears normal for degree of bladder distention. IMPRESSION: Normal size kidneys without hydronephrosis. Mild increased cortical echogenicity compatible medical renal disease. Single bilateral renal cysts as described. Electronically  Signed   By: Marin Olp M.D.   On: 08/23/2015 12:24   Dg Chest Port 1 View  08/22/2015  CLINICAL DATA:  BILATERAL lower extremity swelling since this weekend, known CHF, dyspnea, breast cancer, chronic atrial fibrillation, pulmonary hypertension EXAM: PORTABLE CHEST 1 VIEW COMPARISON:  Portable exam 1533 hours compared to 08/01/2015 FINDINGS: Minimal enlargement of cardiac silhouette. Tortuosity of thoracic aorta. Pulmonary vascularity normal. Chronic RIGHT basilar atelectasis. Lungs otherwise clear. No pleural effusion or pneumothorax. Bones demineralized with degenerative disc disease changes thoracic spine and BILATERAL glenohumeral degenerative changes. IMPRESSION: Minimal enlargement of cardiac silhouette with chronic RIGHT basilar atelectasis. Electronically Signed   By: Lavonia Dana M.D.   On: 08/22/2015 15:50     Assessment/Plan 1. CKD now progressed to ESRD.  Asked by nephrology to go ahead and place a permcath and will plan this today.  Still will need her permanent access placement which is already scheduled for the near future.  This may be done as an inpatient at this point.   2. A. Fib.  Stable, rate controlled. Came in with tachycardia, but better now 3. Hyperlipidemia.  On a statin agent 4. History of breast cancer.      Makailah Slavick, MD  08/29/2015 12:20 PM

## 2015-08-29 NOTE — Progress Notes (Signed)
Hospital Problem List     Principal Problem:   Acute on chronic diastolic (congestive) heart failure (HCC) Active Problems:   Hyperlipidemia   Chronic atrial fibrillation (HCC)   Anemia of chronic disease   Atrial fibrillation with RVR (HCC)   Pressure ulcer   End stage renal disease (HCC)   Generalized edema   Hypoalbuminemia due to protein-calorie malnutrition (HCC)   Hypertensive heart disease   Atrial fibrillation with rapid ventricular response [facility])    Patient Profile:   Primary Cardiologist: Dr. Fletcher Anon  81 y.o. female w/ PMH of chronic atrial fibrillation (on Eliquis), chronic diastolic CHF, Stage 4 CKD, Pulmonary HTN, HLD, OSA (on CPAP), Type 2 DM and HTN who presented to [facility] on 08/22/2015 for increased lower extremity edema over the past week. Started on IV Lasix drip per Nephrology but creatinine continues to worsen.  Subjective   Reports increased swelling in her upper extremities. Voiced frustration this morning saying "everyone asks the same things and I am not feeling any better".   Inpatient Medications    . albumin human  25 g Intravenous Q12H  . cephALEXin  250 mg Oral Q12H  . diltiazem  120 mg Oral Daily  . ferrous sulfate  325 mg Oral Q breakfast  . heparin subcutaneous  5,000 Units Subcutaneous 3 times per day  . letrozole  2.5 mg Oral Daily  . levothyroxine  25 mcg Oral QAC breakfast  . metolazone  5 mg Oral Daily  . metoprolol  150 mg Oral BID  . predniSONE  50 mg Oral Q breakfast  . sodium chloride flush  3 mL Intravenous Q12H    Vital Signs    Filed Vitals:   08/28/15 0405 08/28/15 1212 08/28/15 1937 08/29/15 0420  BP: 151/85 132/71 163/85 136/81  Pulse: 100 101 114 96  Temp: 98.2 F (36.8 C) 97.9 F (36.6 C) 98.1 F (36.7 C) 98.2 F (36.8 C)  TempSrc:  Oral Oral Oral  Resp: 16 18 16 16   Height:      Weight: 209 lb (94.802 kg)   207 lb 1.9 oz (93.947 kg)  SpO2: 98% 99% 97% 100%    Intake/Output Summary (Last 24 hours) at  08/29/15 0816 Last data filed at 08/29/15 0440  Gross per 24 hour  Intake    836 ml  Output    800 ml  Net     36 ml   Filed Weights   08/27/15 0536 08/28/15 0405 08/29/15 0420  Weight: 212 lb 4.8 oz (96.299 kg) 209 lb (94.802 kg) 207 lb 1.9 oz (93.947 kg)    Physical Exam    General: Well developed, well nourished, female appearing in no acute distress. Head: Normocephalic, atraumatic.  Neck: Supple without bruits, JVD elevated to 9 cm. Lungs:  Resp regular and unlabored, decreased breath sounds at the bases bilaterally. No wheezing or rhonchi appreciated. Heart: Irregularly irregular, S1, S2, no S3, S4, or murmur; no rub. Abdomen: Soft, non-tender, non-distended with normoactive bowel sounds. No hepatomegaly. No rebound/guarding. No obvious abdominal masses. Extremities: No clubbing or cyanosis. 2+ pitting edema bilaterally up to thighs. Also present in upper extremities. Distal pedal pulses are 2+ bilaterally. Neuro: Alert and oriented X 3. Moves all extremities spontaneously. Psych: Normal affect.  Labs    CBC  Recent Labs  08/27/15 0615 08/28/15 0447  WBC 10.3 9.7  HGB 6.8* 6.6*  HCT 20.8* 20.1*  MCV 87.2 87.1  PLT 296 [id number]   Basic Metabolic Panel  Recent  Labs  08/27/15 0615 08/28/15 0447 08/29/15 0605  NA 136 139 138  K 3.3* 4.0 3.7  CL 98* 100* 98*  CO2 24 23 22   GLUCOSE 145* 129* 157*  BUN 103* 116* 123*  CREATININE 4.37*  TEST WILL BE CREDITED 4.45* 4.60*  CALCIUM 10.2 10.7* 10.8*  MG 2.1 2.1 2.2  PHOS 5.3*  --   --     Telemetry    Atrial fibrillation, rate-controlled in the 80's - low 100's.   ECG    No new tracings.   Cardiac Studies and Radiology    Echocardiogram: 05/02/2015 Study Conclusions - Left ventricle: The cavity size was normal. There was mild concentric hypertrophy. Systolic function was normal. The estimated ejection fraction was in the range of 60% to 65%. Wall motion was normal; there were no regional wall  motion abnormalities. The study is not technically sufficient to allow evaluation of LV diastolic function. - Left atrium: The atrium was mildly dilated. - Right ventricle: Systolic function was normal. - Pulmonary arteries: Systolic pressure was mildly elevated PA peak pressure: 41 mm Hg (S).  Impressions: - Rhythm is atrial fibrillation.  Assessment & Plan    1. Acute on Chronic Diastolic CHF - presented with worsening lower extremity edema and mild orthopnea and dyspnea. Was seen by Dr. Fletcher Anon on 08/16/2015 and reported not taking her diuretic for over a week.  - BNP 809 on admission. CXR showed minimal enlargement of the cardiac silhouette with chronic right basilar atelectasis. - Weight 216 lbs on 08/16/2015. In 04/2015, she was 192 lbs. Weight at time of admission was 210 lbs, reported as 207 lbs on 08/29/2015. - Nephrology started IV Lasix drip. Recorded net output of only -1.2L this admission, with worsening creatinine. Discussion planned today between the patient, her daughter, and Dr. Juleen China regarding starting HD.   2. Chronic atrial fibrillation - presented in atrial fibrillation with RVR, HR in the 140's. Likely secondary to her significant volume overload. - Started on IV Cardizem at time of admission. HR currently well-controlled in the 70's - 80's. Continue Cardizem CD 120mg  daily. - This patients CHA2DS2-VASc Score and unadjusted Ischemic Stroke Rate (% per year) is equal to 7.2 % stroke rate/year from a score of 5 (CHF, HTN, Female, Age (2)). Eliquis currently held secondary to anemia.  3. Stage 4 CKD - creatinine 4.19 on admission, at 4.60 on 08/29/2015. Was 3.39 in 02/2015. - Nephrology following. Will likely require HD.   4. HTN - BP has been 132/71 - 163/85 in the past 24 hours. - continue current medication regimen. May need to titrate medications, but will do so once the decision regarding HD is made.  5. HLD - reported in her history, but not currently on  statin therapy. - LDL 65 this admission.  6. OSA  - on CPAP  7. Anemia of Chronic Kidney Disease  - Hgb trending down to 6.6 on 08/29/2015. Was 9.3 two months ago. - Nephrology following.  8. Elevated Troponin - troponin 0.08 on admission. Likely secondary to demand ischemia in the setting of CHF/AF RVR. Renal failure may also be playing a role. - Low risk MV in 03/2015 (no ischemia). - Given renal failure and recent neg study, would not pursue further ischemic evaluation at this time.   Signed, Erma Heritage , PA-C 8:16 AM 08/29/2015 Pager: [phone]

## 2015-08-29 NOTE — Progress Notes (Signed)
CSW was informed that patient will be receiving dialysis. Maudie Mercury- dialysis coordinator was notified. CSW spoke to [place] at [facility] and her reports that he is able to take patient if they are interested in their facility. CSW will continue to follow and assist.  Ernest Pine, MSW, [place] Work Department [phone]

## 2015-08-29 NOTE — Op Note (Signed)
OPERATIVE NOTE    PRE-OPERATIVE DIAGNOSIS: 1. ESRD 2. anemia  POST-OPERATIVE DIAGNOSIS: same as above  PROCEDURE: 1. Ultrasound guidance for vascular access to the right internal jugular vein 2. Fluoroscopic guidance for placement of catheter 3. Placement of a 19 cm tip to cuff tunneled hemodialysis catheter via the right internal jugular vein  SURGEON: Leotis Pain, MD  ANESTHESIA:  Local with 1 mg of Versed  ESTIMATED BLOOD LOSS: 25 cc  FLUORO TIME: 0.1 minutes  CONTRAST: none  FINDING(S): 1.  Patent right internal jugular vein  SPECIMEN(S):  None  INDICATIONS:   Teresa Lutz is a 81 y.o. female who presents with progression of her CKD to ESRD.  The patient needs long term dialysis access for their ESRD, and a Permcath is necessary.  Risks and benefits are discussed and informed consent is obtained.    DESCRIPTION: After obtaining full informed written consent, the patient was brought back to the vascular suited. The patient's right neck and chest were sterilely prepped and draped in a sterile surgical field was created. Sedation was administered during a face to face encounter with the patient throughout the procedure with my supervision of the RN administering medicines and monitoring the patient's vital signs, pulse oximetry, telemetry and mental status throughout from the start of the procedure until the patient was taken to the recovery room.  The right internal jugular vein was visualized with ultrasound and found to be patent. It was then accessed under direct ultrasound guidance and a permanent image was recorded. A wire was placed. After skin nick and dilatation, the peel-away sheath was placed over the wire. I then turned my attention to an area under the clavicle. Approximately 1-2 fingerbreadths below the clavicle a small counterincision was created and tunneled from the subclavicular incision to the access site. Using fluoroscopic guidance, a 19 centimeter tip to cuff  tunneled hemodialysis catheter was selected, and tunneled from the subclavicular incision to the access site. It was then placed through the peel-away sheath and the peel-away sheath was removed. Using fluoroscopic guidance the catheter tips were parked in the right atrium. The appropriate distal connectors were placed. It withdrew blood well and flushed easily with heparinized saline and a concentrated heparin solution was then placed. It was secured to the chest wall with 2 Prolene sutures. The access incision was closed single 4-0 Monocryl. A 4-0 Monocryl pursestring suture was placed around the exit site. Sterile dressings were placed. The patient tolerated the procedure well and was taken to the recovery room in stable condition.  COMPLICATIONS: None  CONDITION: Stable  Teresa Lutz  08/29/2015, 1:14 PM

## 2015-08-29 NOTE — Progress Notes (Signed)
[place] NOTE  Pharmacy Consult for electrolyte monitoring  Indication: furosemide drip (CKD V pt... poss HD?)    Allergies  Allergen Reactions  . Celebrex [Celecoxib] Hives   Patient Measurements: Height: 5\' 7"  (170.2 cm) Weight: 207 lb 1.9 oz (93.947 kg) IBW/kg (Calculated) : 61.6  Vital Signs: Temp: 98.2 F (36.8 C) (03/06 0420) Temp Source: Oral (03/06 0420) BP: 136/81 mmHg (03/06 0420) Pulse Rate: 96 (03/06 0420) Intake/Output from previous day: 03/05 0701 - 03/06 0700 In: 836 [P.O.:240; I.V.:96; IV Piggyback:500] Out: 900 [Urine:900] Intake/Output from this shift: Total I/O In: -  Out: 600 [Urine:600] Vent settings for last 24 hours:    Labs:  Recent Labs  08/27/15 0615 08/28/15 0447 08/29/15 0605  WBC 10.3 9.7  --   HGB 6.8* 6.6*  --   HCT 20.8* 20.1*  --   PLT 296 277  --   CREATININE 4.37*  TEST WILL BE CREDITED 4.45* 4.60*  MG 2.1 2.1 2.2  PHOS 5.3*  --   --    Estimated Creatinine Clearance: 11.9 mL/min (by C-G formula based on Cr of 4.6).  Lab Results  Component Value Date   K 3.7 08/29/2015   No results for input(s): GLUCAP in the last 72 hours.  Assessment: Pharmacy consulted for electrolyte monitoring and management for 81 yo female currently ordered furosemide drip 8mg /hr.  Patient CKD V. Possible transition to Hemodialysis- to discuss with family today 3/6.  Plan:  3/6:  K 3.7  Mag 2.2.  No supplementation needed today. F/u labs in am. (Magnesium has been WNL x 5 days, will transition to checking as needed).    Pharmacy will continue to monitor, thank you for the consult.  Chinita Greenland PharmD Clinical Pharmacist 08/29/2015  08/29/2015

## 2015-08-29 NOTE — Care Management (Signed)
Barrier to discharge- renal status is not improving.  Perm cath today and initiate dialysis.  Notified dialysis coordinator

## 2015-08-29 NOTE — Progress Notes (Signed)
[place] at [place] NAME: Teresa Lutz    MR#:  [id number]  DATE OF BIRTH:  19-Jun-1935  SUBJECTIVE:  Seen at HD, about same, feels tired.  REVIEW OF SYSTEMS:  CONSTITUTIONAL: No fever, fatigue or weakness.  EYES: No blurred or double vision.  EARS, NOSE, AND THROAT: No tinnitus or ear pain.  RESPIRATORY: No cough, shortness of breath, wheezing or hemoptysis.  CARDIOVASCULAR: No chest pain, positive orthopnea, positive edema.  GASTROINTESTINAL: No nausea, vomiting, diarrhea or abdominal pain.  GENITOURINARY: No dysuria, hematuria.  ENDOCRINE: No polyuria, nocturia,  HEMATOLOGY: No anemia, easy bruising or bleeding SKIN: No rash or lesion. MUSCULOSKELETAL: No joint pain or arthritis.   NEUROLOGIC: No tingling, numbness, weakness.  PSYCHIATRY: No anxiety or depression.   DRUG ALLERGIES:   Allergies  Allergen Reactions  . Celebrex [Celecoxib] Hives    VITALS:  Blood pressure 121/82, pulse 72, temperature 97.4 F (36.3 C), temperature source Oral, resp. rate 18, height 5\' 7"  (1.702 m), weight 93.947 kg (207 lb 1.9 oz), SpO2 99 %.  PHYSICAL EXAMINATION:  VITAL SIGNS: Filed Vitals:   08/29/15 1630 08/29/15 1700  BP:    Pulse:    Temp:    Resp: 11 18   GENERAL:80 y.o.female currently in no acute distress.  HEAD: Normocephalic, atraumatic.  EYES: Pupils equal, round, reactive to light. Extraocular muscles intact. No scleral icterus.  MOUTH: Moist mucosal membrane. Dentition intact. No abscess noted.  EAR, NOSE, THROAT: Clear without exudates. No external lesions.  NECK: Supple. No thyromegaly. No nodules. No JVD.  PULMONARY: Diminished without wheeze rails or rhonci. No use of accessory muscles, Good respiratory effort. good air entry bilaterally CHEST: Nontender to palpation.  CARDIOVASCULAR: S1 and S2. Irregular rate and rhythm. No murmurs, rubs, or gallops. 3+ edema. Pedal pulses 2+ bilaterally.  GASTROINTESTINAL:  Soft, nontender, nondistended. No masses. Positive bowel sounds. No hepatosplenomegaly.  MUSCULOSKELETAL: 3 plus leg and feet , arm swelling, no clubbing, cyanosis. Range of motion decreased in all extremities.  NEUROLOGIC: Cranial nerves II through XII are intact. No gross focal neurological deficits. Sensation intact. Reflexes intact.  SKIN: No ulceration, lesions, rashes, or cyanosis. Skin warm and dry. Turgor intact.  PSYCHIATRIC: Mood, affect blunted, somnolent, sitting up, chatting Access: Right internal jugular vein tunneled HD cathetar placed on 3/6  LABORATORY PANEL:   CBC  Recent Labs Lab 08/28/15 0447  WBC 9.7  HGB 6.6*  HCT 20.1*  PLT 277   ------------------------------------------------------------------------------------------------------------------  Chemistries   Recent Labs Lab 08/29/15 0605  NA 138  K 3.7  CL 98*  CO2 22  GLUCOSE 157*  BUN 123*  CREATININE 4.60*  CALCIUM 10.8*  MG 2.2   ------------------------------------------------------------------------------------------------------------------  Cardiac Enzymes No results for input(s): TROPONINI in the last 168 hours. ------------------------------------------------------------------------------------------------------------------  RADIOLOGY:  No results found.  EKG:   Orders placed or performed during the hospital encounter of 08/22/15  . ED EKG  . ED EKG    ASSESSMENT AND PLAN:   81 year old African female history of atrial fibrillation, chronic kidney disease stage V, congestive heart failure ejection fraction 60-65 presenting with worsening edema  1. Acute on chronic diastolic congestive heart failure: Cardiology nephrology input appreciated, now requiring HD, 1st session today  2. Atrial fibrillation rapid ventricular response: continue Cardizem, metoprolol, stabilized  3. Chronic kidney disease stage V: now End stage renal disease: s/p Placement of a 19 cm tip to cuff tunneled  hemodialysis catheter via the right internal jugular vein  by Dr Lucky Cowboy on 3/6 - 1st HD today  4. Hypokalemia: Replacing goal 3.5-4.0, Mg level is normal  5. Urinary tract infection site unspecified due to klebsiella pneumoniae: Continue keflex po  6. Hypothyroidism unspecified: Synthroid  7. Hypertension essential: Lopressor, cardizem, off Benicar due to renal failure, blood pressure is well controlled  8. Venous thromboembolism prophylactic: off Eliquis considering gradual decline in Hb, continue heparin SQ 9 metabolic vs uremic encephalopathy, stable/improved, following 10 hand pain,? Etiology, possibly uremia, following, may start gabapentin if no improvement with HD 11 anemia, likely of chronic disease, per iron studies, transfuse after hemodialysis is initiated 12. Generalized weakness, physical therapist evaluation is still pending Disposition:  physical therapy evaluation   All the records are reviewed and case discussed with Care Management/Social Workerr. Management plans discussed with the patient, family and they are in agreement.  CODE STATUS: Full  TOTAL TIME TAKING CARE OF THIS PATIENT: 20 minutes.   POSSIBLE D/C IN 2-3 DAYS, DEPENDING ON CLINICAL CONDITION.   [facility], Iszabella Hebenstreit M.D on 08/29/2015 at 5:31 PM  Between 7am to 6pm - Pager - [phone]  After 6pm: House Pager: - [phone]  [facility]  Office  [phone]  CC: Primary care physician; Casilda Carls, MD

## 2015-08-30 ENCOUNTER — Encounter: Payer: Self-pay | Admitting: Vascular Surgery

## 2015-08-30 LAB — TYPE AND SCREEN
ABO/RH(D): O POS
Antibody Screen: NEGATIVE
UNIT DIVISION: 0

## 2015-08-30 LAB — BASIC METABOLIC PANEL
Anion gap: 14 (ref 5–15)
BUN: 110 mg/dL — AB (ref 6–20)
CHLORIDE: 97 mmol/L — AB (ref 101–111)
CO2: 26 mmol/L (ref 22–32)
CREATININE: 4.02 mg/dL — AB (ref 0.44–1.00)
Calcium: 10.4 mg/dL — ABNORMAL HIGH (ref 8.9–10.3)
GFR calc Af Amer: 11 mL/min — ABNORMAL LOW (ref >60)
GFR calc non Af Amer: 10 mL/min — ABNORMAL LOW (ref >60)
GLUCOSE: 202 mg/dL — AB (ref 65–99)
POTASSIUM: 3.9 mmol/L (ref 3.5–5.1)
Sodium: 137 mmol/L (ref 135–145)

## 2015-08-30 LAB — CBC WITH DIFFERENTIAL/PLATELET
Basophils Absolute: 0 10*3/uL (ref 0–0.1)
Basophils Relative: 0 %
EOS PCT: 0 %
Eosinophils Absolute: 0 10*3/uL (ref 0–0.7)
HCT: 26 % — ABNORMAL LOW (ref 35.0–47.0)
Hemoglobin: 8.7 g/dL — ABNORMAL LOW (ref 12.0–16.0)
LYMPHS ABS: 0.2 10*3/uL — AB (ref 1.0–3.6)
LYMPHS PCT: 2 %
MCH: 28.4 pg (ref 26.0–34.0)
MCHC: 33.5 g/dL (ref 32.0–36.0)
MCV: 85 fL (ref 80.0–100.0)
MONO ABS: 0.5 10*3/uL (ref 0.2–0.9)
Monocytes Relative: 4 %
Neutro Abs: 12.4 10*3/uL — ABNORMAL HIGH (ref 1.4–6.5)
Neutrophils Relative %: 94 %
PLATELETS: 334 10*3/uL (ref 150–440)
RBC: 3.06 MIL/uL — ABNORMAL LOW (ref 3.80–5.20)
RDW: 16.4 % — AB (ref 11.5–14.5)
WBC: 13.2 10*3/uL — ABNORMAL HIGH (ref 3.6–11.0)

## 2015-08-30 LAB — CBC
HEMATOCRIT: 26.9 % — AB (ref 35.0–47.0)
Hemoglobin: 8.9 g/dL — ABNORMAL LOW (ref 12.0–16.0)
MCH: 28.3 pg (ref 26.0–34.0)
MCHC: 33 g/dL (ref 32.0–36.0)
MCV: 85.8 fL (ref 80.0–100.0)
Platelets: 341 10*3/uL (ref 150–440)
RBC: 3.14 MIL/uL — ABNORMAL LOW (ref 3.80–5.20)
RDW: 16.5 % — AB (ref 11.5–14.5)
WBC: 10.2 10*3/uL (ref 3.6–11.0)

## 2015-08-30 LAB — HEPATITIS B SURFACE ANTIBODY,QUALITATIVE: Hep B S Ab: NONREACTIVE

## 2015-08-30 LAB — HEPATITIS B CORE ANTIBODY, TOTAL: HEP B C TOTAL AB: NEGATIVE

## 2015-08-30 LAB — HEPATITIS B SURFACE ANTIGEN: HEP B S AG: NEGATIVE

## 2015-08-30 LAB — PARATHYROID HORMONE, INTACT (NO CA): PTH: 378 pg/mL — AB (ref 15–65)

## 2015-08-30 MED ORDER — EPOETIN ALFA 10000 UNIT/ML IJ SOLN
10000.0000 [IU] | Freq: Once | INTRAMUSCULAR | Status: AC
  Administered 2015-08-30: 10000 [IU] via INTRAVENOUS

## 2015-08-30 NOTE — Progress Notes (Signed)
Pre-hd tx 

## 2015-08-30 NOTE — Progress Notes (Signed)
Post hd tx 

## 2015-08-30 NOTE — Progress Notes (Signed)
[place] NOTE  Pharmacy Consult for electrolyte monitoring  Indication: furosemide drip (CKD V pt... poss HD?)    Allergies  Allergen Reactions  . Celebrex [Celecoxib] Hives   Patient Measurements: Height: 5\' 7"  (170.2 cm) Weight: 224 lb (101.606 kg) IBW/kg (Calculated) : 61.6  Vital Signs: Temp: 97.5 F (36.4 C) (03/07 1112) Temp Source: Oral (03/07 1112) BP: 133/80 mmHg (03/07 1112) Pulse Rate: 59 (03/07 1112) Intake/Output from previous day: 03/06 0701 - 03/07 0700 In: 491 [P.O.:120; I.V.:10; Blood:361] Out: 2000 [Urine:1000] Intake/Output from this shift: Total I/O In: 240 [P.O.:240] Out: -  Vent settings for last 24 hours:    Labs:  Recent Labs  08/28/15 0447 08/29/15 0605 08/29/15 1430 08/29/15 1726 08/30/15 0147 08/30/15 0752  WBC 9.7  --  12.0*  --  13.2* 10.2  HGB 6.6*  --  7.2*  --  8.7* 8.9*  HCT 20.1*  --  22.1*  --  26.0* 26.9*  PLT 277  --  341  --  334 341  CREATININE 4.45* 4.60*  --  4.60*  --  4.02*  MG 2.1 2.2  --   --   --   --   PHOS  --   --   --  8.0*  --   --   ALBUMIN  --   --   --  3.8  --   --    Estimated Creatinine Clearance: 14.1 mL/min (by C-G formula based on Cr of 4.02).  Lab Results  Component Value Date   K 3.9 08/30/2015   No results for input(s): GLUCAP in the last 72 hours.  Assessment: Pharmacy consulted for electrolyte monitoring and management for 81 yo female currently ordered furosemide drip 8mg /hr.  Patient CKD V. Possible transition to Hemodialysis- to discuss with family today 3/6.  K= 3.9  Plan:  No supplementation needed today. F/u labs in am.   Pharmacy will continue to monitor, thank you for the consult.  Ulice Dash, PharmD Clinical Pharmacist   08/30/2015  08/30/2015

## 2015-08-30 NOTE — Progress Notes (Signed)
[facility]  ROUNDING NOTE   Subjective:   Seen and examined on second dialysis treatment. Tolerating well. UF of 2 litres. First dialysis yesterday 3/6.   Objective:  Vital signs in last 24 hours:  Temp:  [97.4 F (36.3 C)-98.3 F (36.8 C)] 97.7 F (36.5 C) (03/07 1415) Pulse Rate:  [59-109] 101 (03/07 1420) Resp:  [13-20] 13 (03/07 1420) BP: (121-156)/(71-101) 154/80 mmHg (03/07 1420) SpO2:  [94 %-100 %] 98 % (03/07 1415) Weight:  [101.606 kg (224 lb)-102 kg (224 lb 13.9 oz)] 102 kg (224 lb 13.9 oz) (03/07 1415)  Weight change: 7.659 kg (16 lb 14.2 oz) Filed Weights   08/29/15 0420 08/30/15 0500 08/30/15 1415  Weight: 93.947 kg (207 lb 1.9 oz) 101.606 kg (224 lb) 102 kg (224 lb 13.9 oz)    Intake/Output: I/O last 3 completed shifts: In: 491 [P.O.:120; I.V.:10; Blood:361] Out: 2200 [Urine:1200; Other:1000]   Intake/Output this shift:  Total I/O In: 240 [P.O.:240] Out: 0   Physical Exam: General: NAD, resting in bed  Head: Moist oral mucosal membranes  Eyes: Anicteric   Neck: Supple, trachea midline  Lungs:  basiilar rales, normal effort  Heart: S1S2 irregular  Abdomen:  Soft, nontender, BS [resemt   Extremities: 3+ peripheral edema.  Neurologic: Nonfocal, moving all four extremities. Able to follow commands  Skin: No lesions       Basic Metabolic Panel:  Recent Labs Lab 08/25/15 0609 08/26/15 0441 08/27/15 0615 08/28/15 0447 08/29/15 0605 08/29/15 1726 08/30/15 0752  NA 141 140 136 139 138 136 137  K 3.1* 3.4*  3.4* 3.3* 4.0 3.7 3.6 3.9  CL 101 102 98* 100* 98* 94* 97*  CO2 26 27 24 23 22 22 26   GLUCOSE 134* 146* 145* 129* 157* 229* 202*  BUN 97* 109* 103* 116* 123* 128* 110*  CREATININE 4.33* 4.41* 4.37*  TEST WILL BE CREDITED 4.45* 4.60* 4.60* 4.02*  CALCIUM 10.2 10.2 10.2 10.7* 10.8* 10.5* 10.4*  MG 2.1 2.2 2.1 2.1 2.2  --   --   PHOS 4.8*  --  5.3*  --   --  8.0*  --     Liver Function Tests:  Recent Labs Lab  08/29/15 1726  ALBUMIN 3.8   No results for input(s): LIPASE, AMYLASE in the last 168 hours. No results for input(s): AMMONIA in the last 168 hours.  CBC:  Recent Labs Lab 08/27/15 0615 08/28/15 0447 08/29/15 1430 08/30/15 0147 08/30/15 0752  WBC 10.3 9.7 12.0* 13.2* 10.2  NEUTROABS  --   --  11.6* 12.4*  --   HGB 6.8* 6.6* 7.2* 8.7* 8.9*  HCT 20.8* 20.1* 22.1* 26.0* 26.9*  MCV 87.2 87.1 86.6 85.0 85.8  PLT 296 277 341 334 341    Cardiac Enzymes: No results for input(s): CKTOTAL, CKMB, CKMBINDEX, TROPONINI in the last 168 hours.  BNP: Invalid input(s): POCBNP  CBG: No results for input(s): GLUCAP in the last 168 hours.  Microbiology: Results for orders placed or performed during the hospital encounter of 08/22/15  MRSA PCR Screening     Status: None   Collection Time: 08/22/15  6:36 PM  Result Value Ref Range Status   MRSA by PCR NEGATIVE NEGATIVE Final    Comment:        The GeneXpert MRSA Assay (FDA approved for NASAL specimens only), is one component of a comprehensive MRSA colonization surveillance program. It is not intended to diagnose MRSA infection nor to guide or monitor treatment for MRSA infections.  Urine culture     Status: None   Collection Time: 08/24/15 11:18 AM  Result Value Ref Range Status   Specimen Description URINE, RANDOM  Final   Special Requests NONE  Final   Culture >=100,000 COLONIES/mL KLEBSIELLA PNEUMONIAE  Final   Report Status 08/27/2015 FINAL  Final   Organism ID, Bacteria KLEBSIELLA PNEUMONIAE  Final      Susceptibility   Klebsiella pneumoniae - MIC*    AMPICILLIN RESISTANT Resistant     CEFAZOLIN <=4 SENSITIVE Sensitive     CEFTRIAXONE <=1 SENSITIVE Sensitive     CIPROFLOXACIN <=0.25 SENSITIVE Sensitive     GENTAMICIN <=1 SENSITIVE Sensitive     IMIPENEM <=0.25 SENSITIVE Sensitive     NITROFURANTOIN 64 INTERMEDIATE Intermediate     TRIMETH/SULFA <=20 SENSITIVE Sensitive     AMPICILLIN/SULBACTAM <=2 SENSITIVE  Sensitive     PIP/TAZO <=4 SENSITIVE Sensitive     Extended ESBL NEGATIVE Sensitive     * >=100,000 COLONIES/mL KLEBSIELLA PNEUMONIAE    Coagulation Studies: No results for input(s): LABPROT, INR in the last 72 hours.  Urinalysis: No results for input(s): COLORURINE, LABSPEC, PHURINE, GLUCOSEU, HGBUR, BILIRUBINUR, KETONESUR, PROTEINUR, UROBILINOGEN, NITRITE, LEUKOCYTESUR in the last 72 hours.  Invalid input(s): APPERANCEUR    Imaging: No results found.   Medications:     . cephALEXin  250 mg Oral Q12H  . diltiazem  120 mg Oral Daily  . ferrous sulfate  325 mg Oral Q breakfast  . letrozole  2.5 mg Oral Daily  . levothyroxine  25 mcg Oral QAC breakfast  . metoprolol  150 mg Oral BID  . polyethylene glycol  17 g Oral Daily  . predniSONE  50 mg Oral Q breakfast  . senna-docusate  2 tablet Oral BID  . sodium chloride flush  3 mL Intravenous Q12H  . tuberculin  5 Units Intradermal Once   sodium chloride, acetaminophen **OR** acetaminophen, albuterol, hydrALAZINE, ondansetron **OR** ondansetron (ZOFRAN) IV, oxyCODONE-acetaminophen, sodium chloride flush  Assessment/ Plan:  81 y.o. female with atrial fibrillation, anemia, coronary artery disease, diabetes mellitus type 2, hypertension, hyperlipidemia, history of breast cancer, CKD stage IV/V who presents with increasing shortness of breath and edema.  1. End stage renal disease: first dialysis on 08/29/15. RIJ permcath. AVF to be placed later this week. She is medically cleared.  Seen and examined on second treatment. Tolerating well.  - Start discharge planning. [place] follow with [facility]  2. Hypertension and Diastolic Congestive Heart Failure with lower extremity edema and anasarca. Remains 3+ on exam today. Echocardiogram from 05/02/15.  - will attempt ultrafiltration with hemodialysis today. Uf goal of 2 litres  3. Anemia of chronic kidney disease. Patient was followed by Dr. Grayland Ormond as an outpatient. Status post 1 unit  PRBC 08/29/15  - EPO with HD treatment.   4. Secondary hyperparathyroidism: with hypercalcemia: phosphorus at goal. PTH 378 holding calcitriol due to hypercalcemia.    LOS: Damon, Adaiah Jaskot 3/7/20172:52 PM

## 2015-08-30 NOTE — Progress Notes (Signed)
[facility]  Daily Progress Note   Subjective  - 1 Day Post-Op  Patient remains lethargic.  Not answering any questions this am and provides no history. Got HD last night.  Objective Filed Vitals:   08/29/15 2301 08/29/15 2303 08/30/15 0420 08/30/15 0500  BP: 150/101 145/93 141/94   Pulse: 109  94   Temp: 98.3 F (36.8 C)  97.6 F (36.4 C)   TempSrc: Oral  Oral   Resp:   18   Height:      Weight:    101.606 kg (224 lb)  SpO2: 97%  98%     Intake/Output Summary (Last 24 hours) at 08/30/15 0919 Last data filed at 08/30/15 0548  Gross per 24 hour  Intake    491 ml  Output   1400 ml  Net   -909 ml    PULM  CTAB CV  RRR VASC  permcath site C/D/I.  Mild bruising only.  Laboratory CBC    Component Value Date/Time   WBC 10.2 08/30/2015 0752   WBC 6.6 03/24/2015 1403   WBC 9.0 03/18/2014 1504   HGB 8.9* 08/30/2015 0752   HGB 10.4* 03/18/2014 1504   HCT 26.9* 08/30/2015 0752   HCT 29.2* 03/24/2015 1403   HCT 31.6* 03/18/2014 1504   PLT 341 08/30/2015 0752   PLT 339 03/24/2015 1403   PLT 285 03/18/2014 1504    BMET    Component Value Date/Time   NA 137 08/30/2015 0752   NA 144 03/24/2015 1403   NA 139 03/18/2014 1504   K 3.9 08/30/2015 0752   K 2.8* 03/18/2014 1504   CL 97* 08/30/2015 0752   CL 100 03/18/2014 1504   CO2 26 08/30/2015 0752   CO2 30 03/18/2014 1504   GLUCOSE 202* 08/30/2015 0752   GLUCOSE 138* 03/24/2015 1403   GLUCOSE 138* 03/18/2014 1504   BUN 110* 08/30/2015 0752   BUN 65* 03/24/2015 1403   BUN 74* 03/18/2014 1504   CREATININE 4.02* 08/30/2015 0752   CREATININE 3.67* 03/18/2014 1504   CALCIUM 10.4* 08/30/2015 0752   CALCIUM 9.8 03/18/2014 1504   GFRNONAA 10* 08/30/2015 0752   GFRNONAA 13* 03/18/2014 1504   GFRAA 11* 08/30/2015 0752   GFRAA 15* 03/18/2014 1504    Assessment/Planning: POD #1 s/p Permcath for ESRD   Permcath worked well for dialysis  No bleeding from site  OK to use as needed until her  permanent access is ready.  My partner has on schedule for permanent access later this week if she is felt medically stable.    Janey Petron  08/30/2015, 9:19 AM

## 2015-08-30 NOTE — Progress Notes (Signed)
PT Cancellation Note  Patient Details Name: Deah Otsuka MRN: [id number] DOB: [birth year]   Cancelled Treatment:    Reason Eval/Treat Not Completed: Patient at procedure or test/unavailable. Pt is at hemodialysis at this time and is unavailable to participate in therapy. PT will f/u tomorrow and resume therapy when appropriate.   Neoma Laming, PT, DPT  08/30/2015, 2:40 PM [phone]

## 2015-08-30 NOTE — Progress Notes (Signed)
[facility] at [place] NAME: Teresa Lutz    MR#:  [id number]  DATE OF BIRTH:  April 11, 1935  SUBJECTIVE:  About same. No new issues.  REVIEW OF SYSTEMS:  CONSTITUTIONAL: No fever, fatigue or weakness.  EYES: No blurred or double vision.  EARS, NOSE, AND THROAT: No tinnitus or ear pain.  RESPIRATORY: No cough, shortness of breath, wheezing or hemoptysis.  CARDIOVASCULAR: No chest pain, positive orthopnea, positive edema.  GASTROINTESTINAL: No nausea, vomiting, diarrhea or abdominal pain.  GENITOURINARY: No dysuria, hematuria.  ENDOCRINE: No polyuria, nocturia,  HEMATOLOGY: No anemia, easy bruising or bleeding SKIN: No rash or lesion. MUSCULOSKELETAL: No joint pain or arthritis.   NEUROLOGIC: No tingling, numbness, weakness.  PSYCHIATRY: No anxiety or depression.   DRUG ALLERGIES:   Allergies  Allergen Reactions  . Celebrex [Celecoxib] Hives    VITALS:  Blood pressure 140/95, pulse 112, temperature 97.7 F (36.5 C), temperature source Oral, resp. rate 12, height 5\' 7"  (1.702 m), weight 102 kg (224 lb 13.9 oz), SpO2 98 %.  PHYSICAL EXAMINATION:  VITAL SIGNS: Filed Vitals:   08/30/15 1500 08/30/15 1530  BP: 149/84 140/95  Pulse: 79 112  Temp:    Resp: 11 12   GENERAL:80 y.o.female currently in no acute distress.  HEAD: Normocephalic, atraumatic.  EYES: Pupils equal, round, reactive to light. Extraocular muscles intact. No scleral icterus.  MOUTH: Moist mucosal membrane. Dentition intact. No abscess noted.  EAR, NOSE, THROAT: Clear without exudates. No external lesions.  NECK: Supple. No thyromegaly. No nodules. No JVD.  PULMONARY: Diminished without wheeze rails or rhonci. No use of accessory muscles, Good respiratory effort. good air entry bilaterally CHEST: Nontender to palpation.  CARDIOVASCULAR: S1 and S2. Irregular rate and rhythm. No murmurs, rubs, or gallops. 3+ edema. Pedal pulses 2+ bilaterally.   GASTROINTESTINAL: Soft, nontender, nondistended. No masses. Positive bowel sounds. No hepatosplenomegaly.  MUSCULOSKELETAL: 3 plus leg and feet , arm swelling, no clubbing, cyanosis. Range of motion decreased in all extremities.  NEUROLOGIC: Cranial nerves II through XII are intact. No gross focal neurological deficits. Sensation intact. Reflexes intact.  SKIN: No ulceration, lesions, rashes, or cyanosis. Skin warm and dry. Turgor intact.  PSYCHIATRIC: Mood, affect blunted, somnolent, sitting up, chatting Access: Right internal jugular vein tunneled HD cathetar placed on 3/6  LABORATORY PANEL:   CBC  Recent Labs Lab 08/30/15 0752  WBC 10.2  HGB 8.9*  HCT 26.9*  PLT 341   ------------------------------------------------------------------------------------------------------------------  Chemistries   Recent Labs Lab 08/29/15 0605  08/30/15 0752  NA 138  < > 137  K 3.7  < > 3.9  CL 98*  < > 97*  CO2 22  < > 26  GLUCOSE 157*  < > 202*  BUN 123*  < > 110*  CREATININE 4.60*  < > 4.02*  CALCIUM 10.8*  < > 10.4*  MG 2.2  --   --   < > = values in this interval not displayed.   ASSESSMENT AND PLAN:   81 year old African female history of atrial fibrillation, chronic kidney disease stage V, congestive heart failure ejection fraction 60-65 presenting with worsening edema  1. Acute on chronic diastolic congestive heart failure: Cardiology nephrology input appreciated, now requiring HD, 2nd session today  2. Atrial fibrillation rapid ventricular response: continue Cardizem, metoprolol, stabilized  3. Chronic kidney disease stage V: now End stage renal disease: s/p Placement of a 19 cm tip to cuff tunneled hemodialysis catheter via the right internal  jugular vein by Dr Lucky Cowboy on 3/6 - 2nd HD today, will need perm cath by vascular - she is medically clear this procedure when vascular would like to perform  4. Hypokalemia: Replacing goal 3.5-4.0, Mg level is normal  5. Urinary tract  infection site unspecified due to klebsiella pneumoniae: Continue keflex po  6. Hypothyroidism unspecified: Synthroid  7. Hypertension essential: Lopressor, cardizem, off Benicar due to renal failure, blood pressure is well controlled  8. Venous thromboembolism prophylactic: off Eliquis considering gradual decline in Hb, continue heparin SQ 9 metabolic vs uremic encephalopathy, stable/improved, following 10 hand pain: Improved with gabapentin 11 anemia, likely of chronic disease, per iron studies, transfuse after hemodialysis is initiated 12. Generalized weakness, physical therapist evaluation is still pending Disposition:  physical therapy evaluation   All the records are reviewed and case discussed with Care Management/Social Workerr. Management plans discussed with the patient, family and they are in agreement.  CODE STATUS: Full  TOTAL TIME TAKING CARE OF THIS PATIENT: 20 minutes.   POSSIBLE D/C IN 2-3 DAYS, DEPENDING ON CLINICAL CONDITION. Will need outpt HD set up and perm cath before D/C   [facility], Rodney Wigger M.D on 08/30/2015 at 4:12 PM  Between 7am to 6pm - Pager - [phone]  After 6pm: House Pager: - [facility]  Office  [phone]  CC: Primary care physician; Casilda Carls, MD

## 2015-08-30 NOTE — Progress Notes (Signed)
Inpatient Diabetes Program Recommendations  AACE/ADA: New Consensus Statement on Inpatient Glycemic Control (2015)  Target Ranges:  Prepandial:   less than 140 mg/dL      Peak postprandial:   less than 180 mg/dL (1-2 hours)      Critically ill patients:  140 - 180 mg/dL   Review of Glycemic Control  Lab glucose   Results for Teresa Lutz, Teresa Lutz (MRN [id number]) as of 08/30/2015 13:47  Ref. Range 08/27/2015 06:15 08/27/2015 06:15 08/28/2015 04:47 08/29/2015 06:05 08/29/2015 14:30 08/29/2015 17:26 08/29/2015 17:50 08/29/2015 17:51 08/29/2015 17:51 08/30/2015 01:47 08/30/2015 07:52  Glucose Latest Ref Range: 65-99 mg/dL  145 (H) 129 (H) 157 (H)  229 (H)     202 (H)    Diabetes history: none noted Outpatient Diabetes medications: no Current orders for Inpatient glycemic control: no  Inpatient Diabetes Program Recommendations:  Elevated lab glucose since starting on steroids- Please consider checking blood sugars q6h and ordering Novolog 0-9 units q6h.   Gentry Fitz, RN, BA, MHA, CDE Diabetes Coordinator Inpatient Diabetes Program  [phone] (Team Pager) [phone] ([place]) 08/30/2015 1:49 PM

## 2015-08-30 NOTE — Progress Notes (Signed)
HD tx completed.

## 2015-08-30 NOTE — Progress Notes (Signed)
Hemodialysis start 

## 2015-08-31 ENCOUNTER — Ambulatory Visit: Admission: RE | Admit: 2015-08-31 | Payer: Medicare HMO | Source: Ambulatory Visit | Admitting: Vascular Surgery

## 2015-08-31 ENCOUNTER — Encounter
Admission: EM | Disposition: A | Discharge status: Skilled Nursing Facility | Payer: Self-pay | Source: Home / Self Care | Attending: Internal Medicine

## 2015-08-31 LAB — CBC
HEMATOCRIT: 27.5 % — AB (ref 35.0–47.0)
Hemoglobin: 8.9 g/dL — ABNORMAL LOW (ref 12.0–16.0)
MCH: 27.7 pg (ref 26.0–34.0)
MCHC: 32.4 g/dL (ref 32.0–36.0)
MCV: 85.7 fL (ref 80.0–100.0)
PLATELETS: 361 10*3/uL (ref 150–440)
RBC: 3.2 MIL/uL — ABNORMAL LOW (ref 3.80–5.20)
RDW: 16.3 % — ABNORMAL HIGH (ref 11.5–14.5)
WBC: 12.9 10*3/uL — ABNORMAL HIGH (ref 3.6–11.0)

## 2015-08-31 LAB — BASIC METABOLIC PANEL
Anion gap: 13 (ref 5–15)
BUN: 83 mg/dL — AB (ref 6–20)
CHLORIDE: 98 mmol/L — AB (ref 101–111)
CO2: 27 mmol/L (ref 22–32)
CREATININE: 3.39 mg/dL — AB (ref 0.44–1.00)
Calcium: 10.5 mg/dL — ABNORMAL HIGH (ref 8.9–10.3)
GFR calc Af Amer: 14 mL/min — ABNORMAL LOW (ref >60)
GFR calc non Af Amer: 12 mL/min — ABNORMAL LOW (ref >60)
Glucose, Bld: 202 mg/dL — ABNORMAL HIGH (ref 65–99)
POTASSIUM: 3.9 mmol/L (ref 3.5–5.1)
Sodium: 138 mmol/L (ref 135–145)

## 2015-08-31 SURGERY — INSERTION OF ARTERIOVENOUS (AV) GORE-TEX GRAFT ARM
Anesthesia: General | Laterality: Left

## 2015-08-31 NOTE — Progress Notes (Deleted)
Resting O2 saturations on 2L [place]: 94%   Resting O2 saturation on RA: 91%  Ambulating O2 saturations on RA: 88% 

## 2015-08-31 NOTE — Progress Notes (Signed)
HD TX Termination 

## 2015-08-31 NOTE — Progress Notes (Signed)
HD Tx Initiation 

## 2015-08-31 NOTE — Progress Notes (Signed)
[facility]  Daily Progress Note   Subjective  - 2 Days Post-Op  Patient feeling much better she is much more alert and tolerating a diet  Objective Filed Vitals:   08/31/15 1345 08/31/15 1400 08/31/15 1459 08/31/15 1933  BP: 117/78 113/78 143/99 129/80  Pulse: 110 106 121 113  Temp:  98.1 F (36.7 C) 98.1 F (36.7 C)   TempSrc:  Oral    Resp: 11 14  21   Height:      Weight:  96.1 kg (211 lb 13.8 oz)    SpO2: 100% 100% 99% 98%    Intake/Output Summary (Last 24 hours) at 08/31/15 2040 Last data filed at 08/31/15 1630  Gross per 24 hour  Intake      0 ml  Output   2500 ml  Net  -2500 ml    PULM  Normal effort , no use of accessory muscles CV  No JVD, RRR Abd      No distended, nontender VASC  tunneled catheter clean dry and intact functioning well; 4+ pitting edema bilateral lower extremities  Laboratory CBC    Component Value Date/Time   WBC 12.9* 08/31/2015 0457   WBC 6.6 03/24/2015 1403   WBC 9.0 03/18/2014 1504   HGB 8.9* 08/31/2015 0457   HGB 10.4* 03/18/2014 1504   HCT 27.5* 08/31/2015 0457   HCT 29.2* 03/24/2015 1403   HCT 31.6* 03/18/2014 1504   PLT 361 08/31/2015 0457   PLT 339 03/24/2015 1403   PLT 285 03/18/2014 1504    BMET    Component Value Date/Time   NA 138 08/31/2015 0457   NA 144 03/24/2015 1403   NA 139 03/18/2014 1504   K 3.9 08/31/2015 0457   K 2.8* 03/18/2014 1504   CL 98* 08/31/2015 0457   CL 100 03/18/2014 1504   CO2 27 08/31/2015 0457   CO2 30 03/18/2014 1504   GLUCOSE 202* 08/31/2015 0457   GLUCOSE 138* 03/24/2015 1403   GLUCOSE 138* 03/18/2014 1504   BUN 83* 08/31/2015 0457   BUN 65* 03/24/2015 1403   BUN 74* 03/18/2014 1504   CREATININE 3.39* 08/31/2015 0457   CREATININE 3.67* 03/18/2014 1504   CALCIUM 10.5* 08/31/2015 0457   CALCIUM 9.8 03/18/2014 1504   GFRNONAA 12* 08/31/2015 0457   GFRNONAA 13* 03/18/2014 1504   GFRAA 14* 08/31/2015 0457   GFRAA 15* 03/18/2014 1504     Assessment/Planning:   End-stage renal disease requiring hemodialysis  Patient is tolerating dialysis well overall her condition is much improved and I will place her on the schedule for upper extremity AV access for this Friday. The risks and benefits have been discussed nothing new has occurred the patient has no new questions and wishes to proceed with her access surgery.    Lutz, Teresa Lory  08/31/2015, 8:40 PM

## 2015-08-31 NOTE — Progress Notes (Signed)
Pre HD TX Machine & Patient Checks 

## 2015-08-31 NOTE — Progress Notes (Signed)
[facility] at [place] NAME: Teresa Lutz    MR#:  [id number]  DATE OF BIRTH:  10-16-1934  SUBJECTIVE:   No acute issues overnight. Plan for AV graft today. REVIEW OF SYSTEMS:  CONSTITUTIONAL: No fever, fatigue or weakness.  EYES: No blurred or double vision.  EARS, NOSE, AND THROAT: No tinnitus or ear pain.  RESPIRATORY: No cough, shortness of breath, wheezing or hemoptysis.  CARDIOVASCULAR: No chest pain, positive orthopnea, positive edema.  GASTROINTESTINAL: No nausea, vomiting, diarrhea or abdominal pain.  GENITOURINARY: No dysuria, hematuria.  ENDOCRINE: No polyuria, nocturia,  HEMATOLOGY: No anemia, easy bruising or bleeding SKIN: No rash or lesion. MUSCULOSKELETAL: No joint pain or arthritis.   NEUROLOGIC: No tingling, numbness, weakness.  PSYCHIATRY: No anxiety or depression.   DRUG ALLERGIES:   Allergies  Allergen Reactions  . Celebrex [Celecoxib] Hives    VITALS:  Blood pressure 132/71, pulse 100, temperature 98.1 F (36.7 C), temperature source Oral, resp. rate 14, height 5\' 7"  (1.702 m), weight 100.109 kg (220 lb 11.2 oz), SpO2 100 %.  PHYSICAL EXAMINATION:  VITAL SIGNS: Filed Vitals:   08/31/15 1040 08/31/15 1100  BP: 132/71   Pulse: 98 100  Temp:    Resp: 67    GENERAL:81 y.o.female currently in no acute distress.  HEAD: Normocephalic, atraumatic.  EYES: Pupils equal, round, reactive to light. Extraocular muscles intact. No scleral icterus.  MOUTH: Moist mucosal membrane. Dentition intact. No abscess noted.  EAR, NOSE, THROAT: Clear without exudates. No external lesions.  NECK: Supple. No thyromegaly. No nodules. No JVD.  PULMONARY: decreased throughout without wheeze rails or rhonci. No use of accessory muscles, Good respiratory effort. good air entry bilaterally CHEST: Nontender to palpation.  CARDIOVASCULAR: S1 and S2. Irregular rate and rhythm. No murmurs, rubs, or gallops. 2+ edema.   GASTROINTESTINAL: Soft, nontender, nondistended. No masses. Positive bowel sounds. No hepatosplenomegaly.  MUSCULOSKELETAL:  Range of motion decreased in all extremities.  NEUROLOGIC: Cranial nerves II through XII are intact. No gross focal neurological deficits. Sensation intact.  SKIN: No ulceration, lesions, rashes, or cyanosis. Skin warm and dry. Turgor intact.  PSYCHIATRIC: Mood, affect blunted,  Access: Right internal jugular vein tunneled HD cathetar placed on 3/6  LABORATORY PANEL:   CBC  Recent Labs Lab 08/31/15 0457  WBC 12.9*  HGB 8.9*  HCT 27.5*  PLT 361   ------------------------------------------------------------------------------------------------------------------  Chemistries   Recent Labs Lab 08/29/15 0605  08/31/15 0457  NA 138  < > 138  K 3.7  < > 3.9  CL 98*  < > 98*  CO2 22  < > 27  GLUCOSE 157*  < > 202*  BUN 123*  < > 83*  CREATININE 4.60*  < > 3.39*  CALCIUM 10.8*  < > 10.5*  MG 2.2  --   --   < > = values in this interval not displayed.   ASSESSMENT AND PLAN:   81 year old African female history of atrial fibrillation, chronic kidney disease stage V, congestive heart failure ejection fraction 60-65 presenting with worsening edema  1. Acute on chronic diastolic congestive heart failure: This is multifactorial due to worsening of renal function, anemia and low albumin. Continue hemodialysis.   2. Atrial fibrillation rapid ventricular response: Heart rates are better controlled. Continue diltiazem and metoprolol.   3.End stage renal disease: s/p  tunneled hemodialysis catheter via the right internal jugular vein by Dr Lucky Cowboy on 3/6 -  Today is her third day of dialysis.  Plan for AV graft as per vascular consult today.  4. Hypokalemia: Improved 5. Urinary tract infection site unspecified due to klebsiella pneumoniae: Continue keflex po  6. Hypothyroidism unspecified: Synthroid  7. Hypertension essential: Blood pressure control.  Continue metoprolol, diltiazem   8. Venous thromboembolism prophylactic: off Eliquis considering gradual decline in Hb, continue heparin SQ 9 uremic encephalopathy: Improving   10 hand pain: Improved with gabapentin 11 anemia of chronic disease:EPO as per nephrology  12. Generalized weakness: PT consult pending for disposition    D/w Dr Abigail Butts Management plans discussed with the patient and she is in agreement.  CODE STATUS: Full  TOTAL TIME TAKING CARE OF THIS PATIENT: 22 minutes.   POSSIBLE D/C IN 2-3 DAYS, DEPENDING ON CLINICAL CONDITION. Will need outpt HD set up    Brinleigh Tew M.D on 08/31/2015 at 11:44 AM  Between 7am to 6pm - Pager - [phone]  After 6pm: House Pager: - [facility]  Office  [phone]  CC: Primary care physician; Casilda Carls, MD

## 2015-08-31 NOTE — Progress Notes (Signed)
Spoke to surgery, they wanted to get report to take patient to get AV fistula.  Explained patient is not NPO (had a big breakfast at 0900), no consent, patient has not been educated and no orders from vascular.   Surgery called back, patient is to be NPO, dr. Lucky Cowboy to come speak to patient and put remaining orders.  Plan for surgery later today.

## 2015-08-31 NOTE — Progress Notes (Signed)
PT Cancellation Note  Patient Details Name: Hawra Metzer MRN: [id number] DOB: 1935-06-18   Cancelled Treatment:    Reason Eval/Treat Not Completed: Patient at procedure or test/unavailable (Pt currently off floor at dialysis.)  Will re-attempt PT at a later date/time.   Raquel Sarna Kinan Safley 08/31/2015, 1:15 PM Leitha Bleak, [place]

## 2015-08-31 NOTE — Progress Notes (Signed)
[facility]  ROUNDING NOTE   Subjective:   Seen and examined on Third dialysis treatment. Tolerating well. UF of 2.5 litres. First dialysis 3/6.   Objective:  Vital signs in last 24 hours:  Temp:  [97.7 F (36.5 C)-98.4 F (36.9 C)] 98.1 F (36.7 C) (03/08 1030) Pulse Rate:  [67-112] 100 (03/08 1100) Resp:  [11-21] 14 (03/08 1040) BP: (127-156)/(71-95) 132/71 mmHg (03/08 1040) SpO2:  [98 %-100 %] 100 % (03/08 1100) Weight:  [100.109 kg (220 lb 11.2 oz)-102 kg (224 lb 13.9 oz)] 100.109 kg (220 lb 11.2 oz) (03/08 0419)  Weight change: 0.394 kg (13.9 oz) Filed Weights   08/30/15 1415 08/30/15 1655 08/31/15 0419  Weight: 102 kg (224 lb 13.9 oz) 100.5 kg (221 lb 9 oz) 100.109 kg (220 lb 11.2 oz)    Intake/Output: I/O last 3 completed shifts: In: 35 [P.O.:240; I.V.:10; Blood:361] Out: 1900 [Urine:400; Other:1500]   Intake/Output this shift:     Physical Exam: General: NAD, resting in bed  Head: Moist oral mucosal membranes  Eyes: Anicteric   Neck: Supple, trachea midline  Lungs:  basiilar rales, normal effort  Heart: S1S2 irregular  Abdomen:  Soft, nontender, BS [resemt   Extremities: 3+ peripheral edema.  Neurologic: Nonfocal, moving all four extremities. Able to follow commands  Skin: No lesions  Access:  RIJ permcath 3/6 Dr. Lucky Cowboy    Basic Metabolic Panel:  Recent Labs Lab 08/25/15 [phone] 08/26/15 0441 08/27/15 0615 08/28/15 0447 08/29/15 0605 08/29/15 1726 08/30/15 0752 08/31/15 0457  NA 141 140 136 139 138 136 137 138  K 3.1* 3.4*  3.4* 3.3* 4.0 3.7 3.6 3.9 3.9  CL 101 102 98* 100* 98* 94* 97* 98*  CO2 26 27 24 23 22 22 26 27   GLUCOSE 134* 146* 145* 129* 157* 229* 202* 202*  BUN 97* 109* 103* 116* 123* 128* 110* 83*  CREATININE 4.33* 4.41* 4.37*  TEST WILL BE CREDITED 4.45* 4.60* 4.60* 4.02* 3.39*  CALCIUM 10.2 10.2 10.2 10.7* 10.8* 10.5* 10.4* 10.5*  MG 2.1 2.2 2.1 2.1 2.2  --   --   --   PHOS 4.8*  --  5.3*  --   --  8.0*  --   --      Liver Function Tests:  Recent Labs Lab 08/29/15 1726  ALBUMIN 3.8   No results for input(s): LIPASE, AMYLASE in the last 168 hours. No results for input(s): AMMONIA in the last 168 hours.  CBC:  Recent Labs Lab 08/28/15 0447 08/29/15 1430 08/30/15 0147 08/30/15 0752 08/31/15 0457  WBC 9.7 12.0* 13.2* 10.2 12.9*  NEUTROABS  --  11.6* 12.4*  --   --   HGB 6.6* 7.2* 8.7* 8.9* 8.9*  HCT 20.1* 22.1* 26.0* 26.9* 27.5*  MCV 87.1 86.6 85.0 85.8 85.7  PLT 277 341 334 341 361    Cardiac Enzymes: No results for input(s): CKTOTAL, CKMB, CKMBINDEX, TROPONINI in the last 168 hours.  BNP: Invalid input(s): POCBNP  CBG: No results for input(s): GLUCAP in the last 168 hours.  Microbiology: Results for orders placed or performed during the hospital encounter of 08/22/15  MRSA PCR Screening     Status: None   Collection Time: 08/22/15  6:36 PM  Result Value Ref Range Status   MRSA by PCR NEGATIVE NEGATIVE Final    Comment:        The GeneXpert MRSA Assay (FDA approved for NASAL specimens only), is one component of a comprehensive MRSA colonization surveillance program. It  is not intended to diagnose MRSA infection nor to guide or monitor treatment for MRSA infections.   Urine culture     Status: None   Collection Time: 08/24/15 11:18 AM  Result Value Ref Range Status   Specimen Description URINE, RANDOM  Final   Special Requests NONE  Final   Culture >=100,000 COLONIES/mL KLEBSIELLA PNEUMONIAE  Final   Report Status 08/27/2015 FINAL  Final   Organism ID, Bacteria KLEBSIELLA PNEUMONIAE  Final      Susceptibility   Klebsiella pneumoniae - MIC*    AMPICILLIN RESISTANT Resistant     CEFAZOLIN <=4 SENSITIVE Sensitive     CEFTRIAXONE <=1 SENSITIVE Sensitive     CIPROFLOXACIN <=0.25 SENSITIVE Sensitive     GENTAMICIN <=1 SENSITIVE Sensitive     IMIPENEM <=0.25 SENSITIVE Sensitive     NITROFURANTOIN 64 INTERMEDIATE Intermediate     TRIMETH/SULFA <=20 SENSITIVE  Sensitive     AMPICILLIN/SULBACTAM <=2 SENSITIVE Sensitive     PIP/TAZO <=4 SENSITIVE Sensitive     Extended ESBL NEGATIVE Sensitive     * >=100,000 COLONIES/mL KLEBSIELLA PNEUMONIAE    Coagulation Studies: No results for input(s): LABPROT, INR in the last 72 hours.  Urinalysis: No results for input(s): COLORURINE, LABSPEC, PHURINE, GLUCOSEU, HGBUR, BILIRUBINUR, KETONESUR, PROTEINUR, UROBILINOGEN, NITRITE, LEUKOCYTESUR in the last 72 hours.  Invalid input(s): APPERANCEUR    Imaging: No results found.   Medications:     . diltiazem  120 mg Oral Daily  . ferrous sulfate  325 mg Oral Q breakfast  . letrozole  2.5 mg Oral Daily  . levothyroxine  25 mcg Oral QAC breakfast  . metoprolol  150 mg Oral BID  . polyethylene glycol  17 g Oral Daily  . predniSONE  50 mg Oral Q breakfast  . senna-docusate  2 tablet Oral BID  . sodium chloride flush  3 mL Intravenous Q12H  . tuberculin  5 Units Intradermal Once   sodium chloride, acetaminophen **OR** acetaminophen, albuterol, hydrALAZINE, ondansetron **OR** ondansetron (ZOFRAN) IV, oxyCODONE-acetaminophen, sodium chloride flush  Assessment/ Plan:  81 y.o. female with atrial fibrillation, anemia, coronary artery disease, diabetes mellitus type 2, hypertension, hyperlipidemia, history of breast cancer, CKD stage IV/V who presents with increasing shortness of breath and edema.  1. End stage renal disease: first dialysis on 08/29/15. RIJ permcath. AVF to be placed today. She is medically cleared.  Seen and examined on third treatment. Tolerating well.  - Start discharge planning. [place] follow with [facility]  2. Hypertension and Diastolic Congestive Heart Failure with lower extremity edema and anasarca. Remains 3+ on exam today. Echocardiogram from 05/02/15.  - will attempt ultrafiltration with hemodialysis today. Uf goal of 2.5 litres  3. Anemia of chronic kidney disease. Patient was followed by Dr. Grayland Ormond as an outpatient. Status  post 1 unit PRBC 08/29/15  - EPO with HD treatment.   4. Secondary hyperparathyroidism: with hypercalcemia: phosphorus at goal. PTH 378 holding calcitriol due to hypercalcemia.    LOS: Teresa Lutz, Teresa Lutz 3/8/201711:46 AM

## 2015-08-31 NOTE — Progress Notes (Addendum)
Patient alert and oriented, delayed response.  HD today, no complications.  VSS, RA.  Afib on monitor.    PPD to be read at [place] on 3/8

## 2015-08-31 NOTE — Progress Notes (Signed)
Paged Dr. Lucky Cowboy since patient has been NPO all day with no orders for surgery.  Dr. Lucky Cowboy states that she will not have surgery today since she had breakfast and to go ahead and feed her when she gets back from dialysis.    He is unsure of when surgery will be scheduled, but possibly on Friday if there is room on schedule.

## 2015-08-31 NOTE — Progress Notes (Signed)
CSW called patient's daughter [name] and presented bed offers. Accepted bed at [facility]. Faxed Therapy notes to [facility]- admissions coordinator at [facility] via [place] to begin [employer]. CSW will continue to follow and assist.  Ernest Pine, MSW, [place] Work Department [phone]

## 2015-08-31 NOTE — Progress Notes (Signed)
Post HD TX Assessment 

## 2015-08-31 NOTE — Progress Notes (Signed)
Pre HD Tx Assessment 

## 2015-08-31 NOTE — Progress Notes (Signed)
[place] NOTE  Pharmacy Consult for electrolyte monitoring  Indication: furosemide drip (CKD V pt... poss HD?)    Allergies  Allergen Reactions  . Celebrex [Celecoxib] Hives   Patient Measurements: Height: 5\' 7"  (170.2 cm) Weight: 220 lb 11.2 oz (100.109 kg) IBW/kg (Calculated) : 61.6  Vital Signs: Temp: 98.1 F (36.7 C) (03/08 1030) Temp Source: Oral (03/08 1030) BP: 118/87 mmHg (03/08 1100) Pulse Rate: 101 (03/08 1130) Intake/Output from previous day: 03/07 0701 - 03/08 0700 In: 240 [P.O.:240] Out: 1500  Intake/Output from this shift:   Vent settings for last 24 hours:    Labs:  Recent Labs  08/29/15 0605  08/29/15 1726 08/30/15 0147 08/30/15 0752 08/31/15 0457  WBC  --   < >  --  13.2* 10.2 12.9*  HGB  --   < >  --  8.7* 8.9* 8.9*  HCT  --   < >  --  26.0* 26.9* 27.5*  PLT  --   < >  --  334 341 361  CREATININE 4.60*  --  4.60*  --  4.02* 3.39*  MG 2.2  --   --   --   --   --   PHOS  --   --  8.0*  --   --   --   ALBUMIN  --   --  3.8  --   --   --   < > = values in this interval not displayed. Estimated Creatinine Clearance: 16.6 mL/min (by C-G formula based on Cr of 3.39).  Lab Results  Component Value Date   K 3.9 08/31/2015   No results for input(s): GLUCAP in the last 72 hours.  Assessment: Pharmacy consulted for electrolyte monitoring and management for 81 yo female currently ordered furosemide drip 8mg /hr.Patient CKD V. Possible transition to Hemodialysis- to discuss with family today 3/6.  K= 3.9  Plan:  No supplementation needed today. K WNL during last 5 days.  Furosemide drip d/c'd on 3/6 now ESRD on HD.   Will sign off at this time. Please reconsult if needed.   Rayna Sexton, PharmD, BCPS Clinical Pharmacist 08/31/2015 11:58 AM ]

## 2015-08-31 NOTE — Progress Notes (Signed)
Post HD Tx Assessment  

## 2015-08-31 NOTE — Progress Notes (Signed)
Paged Dr. Juleen China and explained that Dr. Lucky Cowboy will not be doing surgery on Ms. Cadiente today and may not be able to do surgery until Friday.  Dr. Juleen China states that he will speak to Dr. Lucky Cowboy himself.

## 2015-09-01 ENCOUNTER — Inpatient Hospital Stay: Payer: Medicare HMO

## 2015-09-01 DIAGNOSIS — K668 Other specified disorders of peritoneum: Secondary | ICD-10-CM

## 2015-09-01 LAB — TYPE AND SCREEN
ABO/RH(D): O POS
Antibody Screen: NEGATIVE

## 2015-09-01 LAB — BASIC METABOLIC PANEL
ANION GAP: 13 (ref 5–15)
BUN: 65 mg/dL — AB (ref 6–20)
CALCIUM: 10.5 mg/dL — AB (ref 8.9–10.3)
CO2: 28 mmol/L (ref 22–32)
Chloride: 98 mmol/L — ABNORMAL LOW (ref 101–111)
Creatinine, Ser: 3.14 mg/dL — ABNORMAL HIGH (ref 0.44–1.00)
GFR calc Af Amer: 15 mL/min — ABNORMAL LOW (ref >60)
GFR, EST NON AFRICAN AMERICAN: 13 mL/min — AB (ref >60)
Glucose, Bld: 199 mg/dL — ABNORMAL HIGH (ref 65–99)
POTASSIUM: 4.1 mmol/L (ref 3.5–5.1)
SODIUM: 139 mmol/L (ref 135–145)

## 2015-09-01 LAB — PHOSPHORUS: PHOSPHORUS: 5 mg/dL — AB (ref 2.5–4.6)

## 2015-09-01 LAB — CBC
HCT: 28.1 % — ABNORMAL LOW (ref 35.0–47.0)
Hemoglobin: 8.9 g/dL — ABNORMAL LOW (ref 12.0–16.0)
MCH: 28.2 pg (ref 26.0–34.0)
MCHC: 31.8 g/dL — ABNORMAL LOW (ref 32.0–36.0)
MCV: 88.7 fL (ref 80.0–100.0)
PLATELETS: 321 10*3/uL (ref 150–440)
RBC: 3.17 MIL/uL — AB (ref 3.80–5.20)
RDW: 16.5 % — AB (ref 11.5–14.5)
WBC: 12.8 10*3/uL — AB (ref 3.6–11.0)

## 2015-09-01 LAB — GLUCOSE, CAPILLARY
GLUCOSE-CAPILLARY: 173 mg/dL — AB (ref 65–99)
Glucose-Capillary: 179 mg/dL — ABNORMAL HIGH (ref 65–99)

## 2015-09-01 MED ORDER — PREDNISONE 20 MG PO TABS
40.0000 mg | ORAL_TABLET | Freq: Every day | ORAL | Status: DC
  Administered 2015-09-01: 40 mg via ORAL
  Filled 2015-09-01: qty 2

## 2015-09-01 MED ORDER — CEFAZOLIN SODIUM-DEXTROSE 2-3 GM-% IV SOLR
2.0000 g | INTRAVENOUS | Status: AC
  Administered 2015-09-02: 2 g via INTRAVENOUS
  Filled 2015-09-01: qty 50

## 2015-09-01 MED ORDER — INSULIN ASPART 100 UNIT/ML ~~LOC~~ SOLN
0.0000 [IU] | Freq: Three times a day (TID) | SUBCUTANEOUS | Status: DC
  Administered 2015-09-01 – 2015-09-03 (×2): 2 [IU] via SUBCUTANEOUS
  Administered 2015-09-03: 1 [IU] via SUBCUTANEOUS
  Administered 2015-09-04: 3 [IU] via SUBCUTANEOUS
  Administered 2015-09-04: 2 [IU] via SUBCUTANEOUS
  Administered 2015-09-04 – 2015-09-05 (×2): 1 [IU] via SUBCUTANEOUS
  Administered 2015-09-05 (×2): 2 [IU] via SUBCUTANEOUS
  Administered 2015-09-06: 1 [IU] via SUBCUTANEOUS
  Administered 2015-09-08: 2 [IU] via SUBCUTANEOUS
  Filled 2015-09-01 (×3): qty 2
  Filled 2015-09-01: qty 1
  Filled 2015-09-01: qty 3
  Filled 2015-09-01: qty 2
  Filled 2015-09-01: qty 1
  Filled 2015-09-01 (×2): qty 2

## 2015-09-01 MED ORDER — EPOETIN ALFA 10000 UNIT/ML IJ SOLN
10000.0000 [IU] | Freq: Once | INTRAMUSCULAR | Status: AC
  Administered 2015-09-01: 10000 [IU] via INTRAVENOUS

## 2015-09-01 NOTE — Progress Notes (Signed)
Pre-hd tx 

## 2015-09-01 NOTE — Progress Notes (Signed)
[facility]  ROUNDING NOTE   Subjective:   Third dialysis treatment yesterday. Treatment again today for fluid removal.   Objective:  Vital signs in last 24 hours:  Temp:  [97.6 F (36.4 C)-98.2 F (36.8 C)] 97.6 F (36.4 C) (03/09 0900) Pulse Rate:  [90-121] 96 (03/09 0900) Resp:  [10-21] 16 (03/09 0900) BP: (93-146)/(47-99) 146/91 mmHg (03/09 0900) SpO2:  [98 %-100 %] 100 % (03/09 0900) Weight:  [96.1 kg (211 lb 13.8 oz)-96.84 kg (213 lb 7.9 oz)] 96.84 kg (213 lb 7.9 oz) (03/09 0443)  Weight change: -2.2 kg (-4 lb 13.6 oz) Filed Weights   08/31/15 1030 08/31/15 1400 09/01/15 0443  Weight: 99.8 kg (220 lb 0.3 oz) 96.1 kg (211 lb 13.8 oz) 96.84 kg (213 lb 7.9 oz)    Intake/Output: I/O last 3 completed shifts: In: 0  Out: 3050 [Urine:550; Other:2500]   Intake/Output this shift:     Physical Exam: General: NAD, resting in bed  Head: Moist oral mucosal membranes  Eyes: Anicteric   Neck: Supple, trachea midline  Lungs:  basiilar rales, normal effort  Heart: S1S2 irregular  Abdomen:  Soft, nontender, BS [resemt   Extremities: 2+ peripheral edema.  Neurologic: Nonfocal, moving all four extremities. Able to follow commands  Skin: No lesions  Access:  RIJ permcath 3/6 Dr. Lucky Cowboy    Basic Metabolic Panel:  Recent Labs Lab 08/26/15 0441 08/27/15 0615 08/28/15 0447 08/29/15 [id number] 08/29/15 1726 08/30/15 0752 08/31/15 0457 09/01/15 0429  NA 140 136 139 138 136 137 138 139  K 3.4*  3.4* 3.3* 4.0 3.7 3.6 3.9 3.9 4.1  CL 102 98* 100* 98* 94* 97* 98* 98*  CO2 27 24 23 22 22 26 27 28   GLUCOSE 146* 145* 129* 157* 229* 202* 202* 199*  BUN 109* 103* 116* 123* 128* 110* 83* 65*  CREATININE 4.41* 4.37*  TEST WILL BE CREDITED 4.45* 4.60* 4.60* 4.02* 3.39* 3.14*  CALCIUM 10.2 10.2 10.7* 10.8* 10.5* 10.4* 10.5* 10.5*  MG 2.2 2.1 2.1 2.2  --   --   --   --   PHOS  --  5.3*  --   --  8.0*  --   --  5.0*    Liver Function Tests:  Recent Labs Lab 08/29/15 1726   ALBUMIN 3.8   No results for input(s): LIPASE, AMYLASE in the last 168 hours. No results for input(s): AMMONIA in the last 168 hours.  CBC:  Recent Labs Lab 08/29/15 1430 08/30/15 0147 08/30/15 0752 08/31/15 0457 09/01/15 0429  WBC 12.0* 13.2* 10.2 12.9* 12.8*  NEUTROABS 11.6* 12.4*  --   --   --   HGB 7.2* 8.7* 8.9* 8.9* 8.9*  HCT 22.1* 26.0* 26.9* 27.5* 28.1*  MCV 86.6 85.0 85.8 85.7 88.7  PLT 341 334 341 361 321    Cardiac Enzymes: No results for input(s): CKTOTAL, CKMB, CKMBINDEX, TROPONINI in the last 168 hours.  BNP: Invalid input(s): POCBNP  CBG: No results for input(s): GLUCAP in the last 168 hours.  Microbiology: Results for orders placed or performed during the hospital encounter of 08/22/15  MRSA PCR Screening     Status: None   Collection Time: 08/22/15  6:36 PM  Result Value Ref Range Status   MRSA by PCR NEGATIVE NEGATIVE Final    Comment:        The GeneXpert MRSA Assay (FDA approved for NASAL specimens only), is one component of a comprehensive MRSA colonization surveillance program. It is not intended to  diagnose MRSA infection nor to guide or monitor treatment for MRSA infections.   Urine culture     Status: None   Collection Time: 08/24/15 11:18 AM  Result Value Ref Range Status   Specimen Description URINE, RANDOM  Final   Special Requests NONE  Final   Culture >=100,000 COLONIES/mL KLEBSIELLA PNEUMONIAE  Final   Report Status 08/27/2015 FINAL  Final   Organism ID, Bacteria KLEBSIELLA PNEUMONIAE  Final      Susceptibility   Klebsiella pneumoniae - MIC*    AMPICILLIN RESISTANT Resistant     CEFAZOLIN <=4 SENSITIVE Sensitive     CEFTRIAXONE <=1 SENSITIVE Sensitive     CIPROFLOXACIN <=0.25 SENSITIVE Sensitive     GENTAMICIN <=1 SENSITIVE Sensitive     IMIPENEM <=0.25 SENSITIVE Sensitive     NITROFURANTOIN 64 INTERMEDIATE Intermediate     TRIMETH/SULFA <=20 SENSITIVE Sensitive     AMPICILLIN/SULBACTAM <=2 SENSITIVE Sensitive      PIP/TAZO <=4 SENSITIVE Sensitive     Extended ESBL NEGATIVE Sensitive     * >=100,000 COLONIES/mL KLEBSIELLA PNEUMONIAE    Coagulation Studies: No results for input(s): LABPROT, INR in the last 72 hours.  Urinalysis: No results for input(s): COLORURINE, LABSPEC, PHURINE, GLUCOSEU, HGBUR, BILIRUBINUR, KETONESUR, PROTEINUR, UROBILINOGEN, NITRITE, LEUKOCYTESUR in the last 72 hours.  Invalid input(s): APPERANCEUR    Imaging: No results found.   Medications:     . diltiazem  120 mg Oral Daily  . ferrous sulfate  325 mg Oral Q breakfast  . letrozole  2.5 mg Oral Daily  . levothyroxine  25 mcg Oral QAC breakfast  . metoprolol  150 mg Oral BID  . polyethylene glycol  17 g Oral Daily  . predniSONE  40 mg Oral Q breakfast  . senna-docusate  2 tablet Oral BID  . sodium chloride flush  3 mL Intravenous Q12H   sodium chloride, acetaminophen **OR** acetaminophen, albuterol, hydrALAZINE, ondansetron **OR** ondansetron (ZOFRAN) IV, oxyCODONE-acetaminophen, sodium chloride flush  Assessment/ Plan:  81 y.o. female with atrial fibrillation, anemia, coronary artery disease, diabetes mellitus type 2, hypertension, hyperlipidemia, history of breast cancer, CKD stage IV/V who presents with increasing shortness of breath and edema.  1. End stage renal disease: first dialysis on 08/29/15. RIJ permcath. AVF to be placed on this admission. She is medically cleared.  Seen and examined on third treatment. Tolerating well.  - Start discharge planning. [place] follow with [facility]  2. Hypertension and Diastolic Congestive Heart Failure with lower extremity edema and anasarca. Remains 2+ on exam today. Echocardiogram from 05/02/15.   3. Anemia of chronic kidney disease. Patient was followed by Dr. Grayland Ormond as an outpatient. Status post 1 unit PRBC 08/29/15  - EPO with HD treatment.   4. Secondary hyperparathyroidism: with hypercalcemia: phosphorus at goal. PTH 378 holding calcitriol due to  hypercalcemia.    LOS: [place], [place] 3/9/201711:25 AM

## 2015-09-01 NOTE — Care Management (Signed)
Have discussed the need for patient to be sitting in a chair for dialysis with nursing staff

## 2015-09-01 NOTE — Progress Notes (Signed)
PT Cancellation Note  Patient Details Name: Teresa Lutz MRN: [id number] DOB: [birth year]   Cancelled Treatment:    Reason Eval/Treat Not Completed: Patient at procedure or test/unavailable.  Pt currently off floor in dialysis.  Will re-attempt PT at a different date and time.   Mittie Bodo, SPT Mittie Bodo 09/01/2015, 1:27 PM

## 2015-09-01 NOTE — Progress Notes (Signed)
Hemodialysis start 

## 2015-09-01 NOTE — Progress Notes (Signed)
Inpatient Diabetes Program Recommendations  AACE/ADA: New Consensus Statement on Inpatient Glycemic Control (2015)  Target Ranges:  Prepandial:   less than 140 mg/dL      Peak postprandial:   less than 180 mg/dL (1-2 hours)      Critically ill patients:  140 - 180 mg/dL  Results for YASIRAH, KURAMOTO (MRN [id number]) as of 09/01/2015 09:50  Ref. Range 08/29/2015 17:26 08/30/2015 07:52 08/31/2015 04:57 09/01/2015 04:29  Glucose Latest Ref Range: 65-99 mg/dL 229 (H) 202 (H) 202 (H) 199 (H)   Review of Glycemic Control  Diabetes history: No Outpatient Diabetes medications: NA Current orders for Inpatient glycemic control: None  Inpatient Diabetes Program Recommendations: Correction (SSI): While inpatient and ordered steroids, please consider ordering CBGs with Novolog correction scale. HgbA1C: May want to consider ordering an A1C to evaluate glycemic control over the past 2-3 months.   Thanks, Barnie Alderman, RN, MSN, CDE Diabetes Coordinator Inpatient Diabetes Program [phone] (Team Pager from [place] to [place]) [phone] ([place] office) [phone] [facility] office) [phone] [facility] office)

## 2015-09-01 NOTE — Progress Notes (Signed)
Hemodialysis completed. 

## 2015-09-01 NOTE — Progress Notes (Signed)
[facility] [place] NAME: Teresa Lutz    MR#:  [id number]  DATE OF BIRTH:  04-21-1935  SUBJECTIVE:    No acute issues overnight. Patient remains not very talkative. Plan for AV graft tomorrow. REVIEW OF SYSTEMS:  She has not the best historian  CONSTITUTIONAL: No fever, fatigue or weakness. Decreased appetite EYES: No blurred or double vision.  EARS, NOSE, AND THROAT: No tinnitus or ear pain.  RESPIRATORY: No cough, shortness of breath, wheezing or hemoptysis.  CARDIOVASCULAR: No chest pain, positive orthopnea, positive edema.  GASTROINTESTINAL: No nausea, vomiting, diarrhea or abdominal pain.  GENITOURINARY: No dysuria, hematuria.  ENDOCRINE: No polyuria, nocturia,  HEMATOLOGY: No anemia, easy bruising or bleeding SKIN: No rash or lesion. MUSCULOSKELETAL: No joint pain or arthritis.   NEUROLOGIC: No tingling, numbness, weakness.  PSYCHIATRY: No anxiety or depression.   DRUG ALLERGIES:   Allergies  Allergen Reactions  . Celebrex [Celecoxib] Hives    VITALS:  Blood pressure 146/91, pulse 96, temperature 97.6 F (36.4 C), temperature source Oral, resp. rate 16, height 5\' 7"  (1.702 m), weight 96.84 kg (213 lb 7.9 oz), SpO2 100 %.  PHYSICAL EXAMINATION:  VITAL SIGNS: Filed Vitals:   09/01/15 0443 09/01/15 0900  BP: 132/87 146/91  Pulse: 90 96  Temp: 98.2 F (36.8 C) 97.6 F (36.4 C)  Resp: 18 16   GENERAL:81 y.o.female not in acute distress HEAD: Normocephalic, atraumatic.  EYES: Pupils equal, round, reactive to light. Extraocular muscles intact. No scleral icterus.  MOUTH: Moist mucosal membrane. Dentition intact. No abscess noted.  EAR, NOSE, THROAT: Clear without exudates. No external lesions.  NECK: Supple. No thyromegaly. No nodules. No JVD.  PULMONARY: No wheeze rails or rhonci. No use of accessory muscles, Good respiratory effort. good air entry bilaterally CHEST: Nontender to palpation.  CARDIOVASCULAR:  S1 and S2. No murmurs, rubs, or gallops. 2+ edema.  GASTROINTESTINAL: Soft, nontender, nondistended. No masses. Positive bowel sounds. No hepatosplenomegaly.  MUSCULOSKELETAL:  Range of motion decreased in all extremities.  NEUROLOGIC: Cranial nerves II through XII are intact. No gross focal neurological deficits. SKIN: No ulceration, lesions, rashes, or cyanosis. Skin warm and dry. Turgor intact.  PSYCHIATRIC: Mood, affect blunted,  Access: Right internal jugular vein tunneled HD cathetar placed on 3/6  LABORATORY PANEL:   CBC  Recent Labs Lab 09/01/15 0429  WBC 12.8*  HGB 8.9*  HCT 28.1*  PLT 321   ------------------------------------------------------------------------------------------------------------------  Chemistries   Recent Labs Lab 08/29/15 0605  09/01/15 0429  NA 138  < > 139  K 3.7  < > 4.1  CL 98*  < > 98*  CO2 22  < > 28  GLUCOSE 157*  < > 199*  BUN 123*  < > 65*  CREATININE 4.60*  < > 3.14*  CALCIUM 10.8*  < > 10.5*  MG 2.2  --   --   < > = values in this interval not displayed.   ASSESSMENT AND PLAN:   81 year old African female history of atrial fibrillation, chronic kidney disease stage V, congestive heart failure ejection fraction 60-65 presenting with worsening edema  1. Acute on chronic diastolic congestive heart failure: This is multifactorial due to worsening of renal function, anemia and low albumin. Continue hemodialysis.   2. Atrial fibrillation rapid ventricular response: Heart rates are better controlled. Continue diltiazem and metoprolol.   3.End stage renal disease: s/p  tunneled hemodialysis catheter via the right internal jugular vein by Dr Lucky Cowboy on 3/6 -  She has had 3 dialysis sessions and case management should be assisting on outpatient dialysis placement. Plan for AV graft as per vascular on Friday. Hemodialysis is planned per nephrology  4. Hypokalemia: Improved 5. Urinary tract infection site unspecified due to klebsiella  pneumoniae: Treated with keflex   6. Hypothyroidism unspecified: Continue Synthroid  7. Hypertension essential: Blood pressure control. Continue metoprolol, diltiazem   8. Venous thromboembolism prophylactic: off Eliquis considering gradual decline in Hb, continue heparin SQ 9 uremic encephalopathy: Seems to be at baseline 10 hand pain: Improved with gabapentin 11 anemia of chronic disease:EPO as per nephrology Hemoglobin stable 8.9 12. Generalized weakness: PT consult pending for disposition     Management plans discussed with the patient and she is in agreement.  CODE STATUS: Full  TOTAL TIME TAKING CARE OF THIS PATIENT: 22 minutes.   POSSIBLE D/C IN 2-3 DAYS, DEPENDING ON CLINICAL CONDITION. Will need outpt HD set up    Samson Ralph M.D on 09/01/2015 at 10:29 AM  Between 7am to 6pm - Pager - [phone]  After 6pm: House Pager: - [facility]  Office  [phone]  CC: Primary care physician; Casilda Carls, MD

## 2015-09-01 NOTE — Care Management Note (Signed)
I am currently working on outpatient dialysis at [facility].  Once I have conformation of placement I will update records.  Iran Sizer  Dialysis Coordinator  [phone]

## 2015-09-01 NOTE — Care Management Important Message (Signed)
Important Message  Patient Details  Name: Teresa Lutz MRN: [id number] Date of Birth: 1935/02/18   Medicare Important Message Given:  Yes    [name] 09/01/2015, 11:41 AM

## 2015-09-01 NOTE — Progress Notes (Signed)
Radiology called me about KUB I ordered earlier today due to patinet grimacing when I examined her abdomen and no BM for several days. KUB with free air. REC CT IV contrast. I spoke with radiology who spoke with Dr Loman Chroman perform IC contrasted CT scan so non contrast ordered. I spoke with Dr Heath Lark about KUB she will see patient.  TIME 25 minutes

## 2015-09-01 NOTE — Progress Notes (Signed)
Pt abdominal CT concerning for free air and possible ruptured diverticula,spoke with Dr.Laughlin,who saw the patient ,I told her about CT findings,she will talk to radiologist.pt has no abdominal pain,

## 2015-09-01 NOTE — Progress Notes (Signed)
Post hd tx 

## 2015-09-01 NOTE — Consult Note (Signed)
Patient ID: Teresa Lutz, female   DOB: [birth year], 81 y.o.   MRN: [id number]  CC: Free Air  HPI Teresa Lutz is a 81 y.o. female who is currently admitted to the medicine service who obtained images today which showed free intraperitoneal air. Surgery consult requested by Dr. Benjie Karvonen for evaluation of this. Patient is currently admitted for acute on chronic diastolic congestive heart failure who also has end-stage renal disease that is requiring hemodialysis. Earlier today patient had an abdominal exam for which she had some grimacing but no complaints of pain. Secondary to the grimacing and lack of a bowel movement for many days a KUB was obtained. Results of this KUB showed possible free intraperitoneal air and a CT scan and surgical consultation was requested thereafter. CT scan confirmed free intra-abdominal air. Patient denies any abdominal pain. She also denies any nausea, vomiting, diarrhea, constipation. However upon pressing patient did not have a bowel movement for many days and was manually disimpacted by my partner earlier today. She does state she has passed flatus since that time.  HPI  Past Medical History  Diagnosis Date  . Arthritis 1990  . Breast cancer ([place]) 2012    Right stage 1  . Ulcer 1980  . Malignant neoplasm of upper-inner quadrant of female breast ([place])     right  . Hemorrhoid   . Unspecified vitamin D deficiency   . Anemia of chronic disease   . Hypopotassemia   . Unspecified hypothyroidism   . Goiter, specified as simple   . Hyperlipidemia   . CKD (chronic kidney disease), stage IV ([place])   . Hypertension 1960  . Chronic atrial fibrillation ([place]) 2014    a. Eliquis 5 mg bid; CHADSVASc 5 (CHF, HTN, age x 2, female)  . Lower extremity edema   . Chronic diastolic CHF (congestive heart failure) ([facility])     a. EF 60-65% by echo in 04/2015  . Pulmonary HTN ([place])     a. PA peak pressure 41 mmHg by echo in 04/2015    Past Surgical History  Procedure Laterality  Date  . Mammosite balloon placement  Right 11/2010  . Colonoscopy  2011  . Breast lumpectomy Right 50 years ago  . Breast lumpectomy Right 2012    invasive   . Cardiac catheterization  06/2010    [facility]: Mild nonobstructive coronary artery disease with normal ejection fraction.  . Cardioversion    . Peripheral vascular catheterization N/A 08/29/2015    Procedure: Dialysis/Perma Catheter Insertion;  Surgeon: Algernon Huxley, MD;  Location: [facility];  Service: Cardiovascular;  Laterality: N/A;    Family History  Problem Relation Age of Onset  . Cancer Mother     ovarian  . Hypertension Mother   . Cancer Brother     colon  . Stroke Father     Social History Social History  Substance Use Topics  . Smoking status: Never Smoker   . Smokeless tobacco: Never Used  . Alcohol Use: No    Allergies  Allergen Reactions  . Celebrex [Celecoxib] Hives    Current Facility-Administered Medications  Medication Dose Route Frequency Provider Last Rate Last Dose  . 0.9 %  sodium chloride infusion  250 mL Intravenous PRN Demetrios Loll, MD 10 mL/hr at 08/23/15 1500 250 mL at 08/23/15 1500  . acetaminophen (TYLENOL) tablet 650 mg  650 mg Oral Q6H PRN Demetrios Loll, MD   650 mg at 08/22/15 2114   Or  . acetaminophen (TYLENOL) suppository 650  mg  650 mg Rectal Q6H PRN Demetrios Loll, MD      . albuterol (PROVENTIL) (2.5 MG/3ML) 0.083% nebulizer solution 2.5 mg  2.5 mg Nebulization Q2H PRN Demetrios Loll, MD      . Derrill Memo ON 09/02/2015] ceFAZolin (ANCEF) IVPB 2 g/50 mL premix  2 g Intravenous On Call to Sigurd, MD      . diltiazem (CARDIZEM CD) 24 hr capsule 120 mg  120 mg Oral Daily Demetrios Loll, MD   120 mg at 09/01/15 1658  . ferrous sulfate tablet 325 mg  325 mg Oral Q breakfast Demetrios Loll, MD   325 mg at 09/01/15 1659  . hydrALAZINE (APRESOLINE) injection 10 mg  10 mg Intravenous Q4H PRN Saundra Shelling, MD   10 mg at 08/27/15 0627  . insulin aspart (novoLOG) injection 0-9 Units  0-9 Units  Subcutaneous TID WC Bettey Costa, MD   2 Units at 09/01/15 1659  . letrozole [facility]) tablet 2.5 mg  2.5 mg Oral Daily Demetrios Loll, MD   2.5 mg at 09/01/15 1659  . levothyroxine (SYNTHROID, LEVOTHROID) tablet 25 mcg  25 mcg Oral QAC breakfast Demetrios Loll, MD   25 mcg at 09/01/15 [id number]  . metoprolol (LOPRESSOR) tablet 150 mg  150 mg Oral BID Demetrios Loll, MD   150 mg at 09/01/15 2137  . ondansetron (ZOFRAN) tablet 4 mg  4 mg Oral Q6H PRN Demetrios Loll, MD       Or  . ondansetron [facility]) injection 4 mg  4 mg Intravenous Q6H PRN Demetrios Loll, MD      . oxyCODONE-acetaminophen (PERCOCET/ROXICET) 5-325 MG per tablet 1 tablet  1 tablet Oral Q6H PRN Flora Lipps, MD   1 tablet at 08/23/15 1201  . polyethylene glycol (MIRALAX / GLYCOLAX) packet 17 g  17 g Oral Daily Vipul Shah, MD   17 g at 09/01/15 1659  . predniSONE (DELTASONE) tablet 40 mg  40 mg Oral Q breakfast Bettey Costa, MD   40 mg at 09/01/15 1658  . senna-docusate (Senokot-S) tablet 2 tablet  2 tablet Oral BID Max Sane, MD   2 tablet at 09/01/15 2137  . sodium chloride flush (NS) 0.9 % injection 3 mL  3 mL Intravenous Q12H Demetrios Loll, MD   3 mL at 09/01/15 2138  . sodium chloride flush (NS) 0.9 % injection 3 mL  3 mL Intravenous PRN Demetrios Loll, MD         Review of Systems A Multi-point review of systems was asked and was negative except for the findings documented in the history of present illness   Physical Exam Blood pressure 132/72, pulse 99, temperature 98.1 F (36.7 C), temperature source Oral, resp. rate 20, height 5\' 7"  (1.702 m), weight 93.8 kg (206 lb 12.7 oz), SpO2 96 %. CONSTITUTIONAL: No acute distress. EYES: Pupils are equal, round, and reactive to light,  EARS, NOSE, MOUTH AND THROAT: The oropharynx is clear. The oral mucosa is pink and moist. Hearing is intact to voice. LYMPH NODES:  Lymph nodes in the neck are normal. RESPIRATORY:  Lungs are clear. There is normal respiratory effort, with equal breath sounds bilaterally, and without  pathologic use of accessory muscles. CARDIOVASCULAR: Heart is irregular without murmurs, gallops, or rubs. GI: The abdomen is soft, nontender, without any evidence of tympany and nondistended. There are no palpable masses. There is no hepatosplenomegaly. There are normal bowel sounds in all quadrants. GU: Rectal deferred.   MUSCULOSKELETAL: Normal muscle strength and tone. No  cyanosis or edema.   SKIN: Turgor is good and there are no pathologic skin lesions or ulcers. NEUROLOGIC: Motor and sensation is grossly normal. Cranial nerves are grossly intact. PSYCH:  Oriented to person, place and time. Affect is normal.  Data Reviewed Images reviewed that appear to show massive free intra-abdominal air. Labs she also has a mild leukocytosis at 12.8 and multiple electrolyte laterality secondary to renal failure. I have personally reviewed the patient's imaging, laboratory findings and medical records.    Assessment    Perforated viscus    Plan    81 year old female multiple medical problems including end-stage renal disease and what appears to be new-onset of perforated viscus. Massive amounts of free air within the abdomen should be causing pain, nausea, vomiting and overall discomfort. Patient is without any of these despite her image findings. Her absolute lack of symptoms and overall appearance are not concordant with her images. However, it is possible that she perforated a diverticulum that has sealed itself and thus being minimally symptomatic. Currently no plans for operative intervention for this. Plan for repeat abdominal exams and repeat labs. An absence of any clinical deterioration there is no indication for surgical intervention. General surgery will continue to follow with you     Time spent with the patient was 60 minutes, with more than 50% of the time spent in face-to-face education, counseling and care coordination.     Clayburn Pert, MD FACS General Surgeon 09/01/2015, 10:00  PM

## 2015-09-02 ENCOUNTER — Encounter: Payer: Self-pay | Admitting: Anesthesiology

## 2015-09-02 ENCOUNTER — Encounter
Admission: EM | Disposition: A | Discharge status: Skilled Nursing Facility | Payer: Self-pay | Source: Home / Self Care | Attending: Internal Medicine

## 2015-09-02 ENCOUNTER — Inpatient Hospital Stay: Payer: Medicare HMO | Admitting: Certified Registered"

## 2015-09-02 DIAGNOSIS — K5720 Diverticulitis of large intestine with perforation and abscess without bleeding: Secondary | ICD-10-CM

## 2015-09-02 DIAGNOSIS — K5730 Diverticulosis of large intestine without perforation or abscess without bleeding: Secondary | ICD-10-CM

## 2015-09-02 HISTORY — PX: AV FISTULA PLACEMENT: SHX1204

## 2015-09-02 LAB — GLUCOSE, CAPILLARY
GLUCOSE-CAPILLARY: 135 mg/dL — AB (ref 65–99)
GLUCOSE-CAPILLARY: 167 mg/dL — AB (ref 65–99)
Glucose-Capillary: 192 mg/dL — ABNORMAL HIGH (ref 65–99)
Glucose-Capillary: 157 mg/dL — ABNORMAL HIGH (ref 65–99)

## 2015-09-02 LAB — BASIC METABOLIC PANEL
Anion gap: 11 (ref 5–15)
BUN: 50 mg/dL — ABNORMAL HIGH (ref 6–20)
CALCIUM: 10.4 mg/dL — AB (ref 8.9–10.3)
CO2: 28 mmol/L (ref 22–32)
CREATININE: 2.79 mg/dL — AB (ref 0.44–1.00)
Chloride: 98 mmol/L — ABNORMAL LOW (ref 101–111)
GFR, EST AFRICAN AMERICAN: 18 mL/min — AB (ref >60)
GFR, EST NON AFRICAN AMERICAN: 15 mL/min — AB (ref >60)
GLUCOSE: 192 mg/dL — AB (ref 65–99)
Potassium: 4.1 mmol/L (ref 3.5–5.1)
Sodium: 137 mmol/L (ref 135–145)

## 2015-09-02 LAB — CBC
HEMATOCRIT: 29.8 % — AB (ref 35.0–47.0)
Hemoglobin: 9.6 g/dL — ABNORMAL LOW (ref 12.0–16.0)
MCH: 28 pg (ref 26.0–34.0)
MCHC: 32.3 g/dL (ref 32.0–36.0)
MCV: 86.7 fL (ref 80.0–100.0)
Platelets: 340 10*3/uL (ref 150–440)
RBC: 3.44 MIL/uL — ABNORMAL LOW (ref 3.80–5.20)
RDW: 16.7 % — AB (ref 11.5–14.5)
WBC: 13.9 10*3/uL — ABNORMAL HIGH (ref 3.6–11.0)

## 2015-09-02 SURGERY — INSERTION OF ARTERIOVENOUS (AV) GORE-TEX GRAFT ARM
Anesthesia: General | Site: Arm Upper | Laterality: Left | Wound class: Clean

## 2015-09-02 MED ORDER — PREDNISONE 20 MG PO TABS
40.0000 mg | ORAL_TABLET | Freq: Every day | ORAL | Status: AC
  Administered 2015-09-02: 40 mg via ORAL
  Filled 2015-09-02: qty 2

## 2015-09-02 MED ORDER — KETAMINE HCL 50 MG/ML IJ SOLN
INTRAMUSCULAR | Status: DC | PRN
  Administered 2015-09-02: 25 mg via INTRAMUSCULAR

## 2015-09-02 MED ORDER — BUPIVACAINE HCL (PF) 0.5 % IJ SOLN
INTRAMUSCULAR | Status: AC
  Filled 2015-09-02: qty 30

## 2015-09-02 MED ORDER — PREDNISONE 20 MG PO TABS
30.0000 mg | ORAL_TABLET | Freq: Every day | ORAL | Status: AC
  Administered 2015-09-03: 30 mg via ORAL
  Filled 2015-09-02: qty 1

## 2015-09-02 MED ORDER — SODIUM CHLORIDE 0.9 % IV SOLN
INTRAVENOUS | Status: DC
  Administered 2015-09-02: 14:00:00 via INTRAVENOUS

## 2015-09-02 MED ORDER — ONDANSETRON HCL 4 MG/2ML IJ SOLN
INTRAMUSCULAR | Status: DC | PRN
  Administered 2015-09-02: 4 mg via INTRAVENOUS

## 2015-09-02 MED ORDER — FENTANYL CITRATE (PF) 100 MCG/2ML IJ SOLN
INTRAMUSCULAR | Status: DC | PRN
  Administered 2015-09-02: 50 ug via INTRAVENOUS
  Administered 2015-09-02: 25 ug via INTRAVENOUS
  Administered 2015-09-02: 50 ug via INTRAVENOUS
  Administered 2015-09-02: 25 ug via INTRAVENOUS

## 2015-09-02 MED ORDER — PROPOFOL 10 MG/ML IV BOLUS
INTRAVENOUS | Status: DC | PRN
  Administered 2015-09-02: 130 mg via INTRAVENOUS

## 2015-09-02 MED ORDER — PAPAVERINE HCL 30 MG/ML IJ SOLN
INTRAMUSCULAR | Status: AC
  Filled 2015-09-02: qty 2

## 2015-09-02 MED ORDER — PREDNISONE 20 MG PO TABS
20.0000 mg | ORAL_TABLET | Freq: Every day | ORAL | Status: AC
  Administered 2015-09-04: 20 mg via ORAL
  Filled 2015-09-02: qty 1

## 2015-09-02 MED ORDER — FENTANYL CITRATE (PF) 100 MCG/2ML IJ SOLN: 25.0000 ug | INTRAMUSCULAR | Status: DC | PRN

## 2015-09-02 MED ORDER — HEPARIN SODIUM (PORCINE) 5000 UNIT/ML IJ SOLN
INTRAMUSCULAR | Status: AC
  Filled 2015-09-02: qty 1

## 2015-09-02 MED ORDER — ONDANSETRON HCL 4 MG/2ML IJ SOLN: 4.0000 mg | Freq: Once | INTRAMUSCULAR | Status: DC | PRN

## 2015-09-02 MED ORDER — PHENYLEPHRINE HCL 10 MG/ML IJ SOLN
INTRAMUSCULAR | Status: DC | PRN
  Administered 2015-09-02 (×7): 100 ug via INTRAVENOUS

## 2015-09-02 MED ORDER — LIDOCAINE HCL (CARDIAC) 20 MG/ML IV SOLN
INTRAVENOUS | Status: DC | PRN
  Administered 2015-09-02: 50 mg via INTRAVENOUS

## 2015-09-02 MED ORDER — BUPIVACAINE HCL (PF) 0.5 % IJ SOLN
INTRAMUSCULAR | Status: DC | PRN
  Administered 2015-09-02: 22 mL

## 2015-09-02 MED ORDER — PREDNISONE 10 MG PO TABS
10.0000 mg | ORAL_TABLET | Freq: Every day | ORAL | Status: AC
  Administered 2015-09-05: 10 mg via ORAL
  Filled 2015-09-02: qty 1

## 2015-09-02 SURGICAL SUPPLY — 56 items
APPLIER CLIP 11 MED OPEN (CLIP)
APPLIER CLIP 9.375 SM OPEN (CLIP)
BAG COUNTER SPONGE EZ (MISCELLANEOUS) IMPLANT
BAG DECANTER FOR FLEXI CONT (MISCELLANEOUS) ×3 IMPLANT
BLADE SURG SZ11 CARB STEEL (BLADE) ×3 IMPLANT
BOOT SUTURE AID YELLOW STND (SUTURE) ×3 IMPLANT
BRUSH SCRUB 4% CHG (MISCELLANEOUS) ×3 IMPLANT
CANISTER SUCT 1200ML W/VALVE (MISCELLANEOUS) ×3 IMPLANT
CHLORAPREP W/TINT 26ML (MISCELLANEOUS) ×6 IMPLANT
CLIP APPLIE 11 MED OPEN (CLIP) IMPLANT
CLIP APPLIE 9.375 SM OPEN (CLIP) IMPLANT
COUNTER SPONGE BAG EZ (MISCELLANEOUS)
DRESSING SURGICEL FIBRLLR 1X2 (HEMOSTASIS) ×1 IMPLANT
DRSG SURGICEL FIBRILLAR 1X2 (HEMOSTASIS) ×3
ELECT CAUTERY BLADE 6.4 (BLADE) ×3 IMPLANT
ELECT REM PT RETURN 9FT ADLT (ELECTROSURGICAL) ×3
ELECTRODE REM PT RTRN 9FT ADLT (ELECTROSURGICAL) ×1 IMPLANT
GLOVE BIO SURGEON STRL SZ7 (GLOVE) ×15 IMPLANT
GLOVE INDICATOR 7.5 STRL GRN (GLOVE) ×3 IMPLANT
GLOVE SURG SYN 8.0 (GLOVE) ×3 IMPLANT
GOWN STRL REUS W/ TWL LRG LVL3 (GOWN DISPOSABLE) ×3 IMPLANT
GOWN STRL REUS W/ TWL XL LVL3 (GOWN DISPOSABLE) ×1 IMPLANT
GOWN STRL REUS W/TWL LRG LVL3 (GOWN DISPOSABLE) ×6
GOWN STRL REUS W/TWL XL LVL3 (GOWN DISPOSABLE) ×2
GRAFT PROPATEN STD WALL 4 7X45 (Vascular Products) ×3 IMPLANT
IV NS 500ML (IV SOLUTION) ×2
IV NS 500ML BAXH (IV SOLUTION) ×1 IMPLANT
KIT RM TURNOVER STRD PROC AR (KITS) ×3 IMPLANT
LABEL OR SOLS (LABEL) IMPLANT
LIQUID BAND (GAUZE/BANDAGES/DRESSINGS) ×3 IMPLANT
LOOP RED MAXI  1X406MM (MISCELLANEOUS) ×2
LOOP VESSEL MAXI 1X406 RED (MISCELLANEOUS) ×1 IMPLANT
LOOP VESSEL MINI 0.8X406 BLUE (MISCELLANEOUS) ×2 IMPLANT
LOOPS BLUE MINI 0.8X406MM (MISCELLANEOUS) ×4
NEEDLE FILTER BLUNT 18X 1/2SAF (NEEDLE) ×2
NEEDLE FILTER BLUNT 18X1 1/2 (NEEDLE) ×1 IMPLANT
NS IRRIG 500ML POUR BTL (IV SOLUTION) ×3 IMPLANT
PACK EXTREMITY ARMC (MISCELLANEOUS) ×3 IMPLANT
PAD PREP 24X41 OB/GYN DISP (PERSONAL CARE ITEMS) ×3 IMPLANT
PUNCH SURGICAL ROTATE 2.7MM (MISCELLANEOUS) IMPLANT
STOCKINETTE STRL 4IN 9604848 (GAUZE/BANDAGES/DRESSINGS) ×3 IMPLANT
SUT GTX CV-6 30 (SUTURE) ×15 IMPLANT
SUT MNCRL+ 5-0 UNDYED PC-3 (SUTURE) ×2 IMPLANT
SUT MONOCRYL 5-0 (SUTURE) ×4
SUT PROLENE 6 0 BV (SUTURE) ×6 IMPLANT
SUT SILK 2 0 (SUTURE) ×4
SUT SILK 2 0 SH (SUTURE) ×3 IMPLANT
SUT SILK 2-0 18XBRD TIE 12 (SUTURE) ×2 IMPLANT
SUT SILK 3 0 (SUTURE) ×2
SUT SILK 3-0 18XBRD TIE 12 (SUTURE) ×1 IMPLANT
SUT SILK 4 0 (SUTURE) ×2
SUT SILK 4-0 18XBRD TIE 12 (SUTURE) ×1 IMPLANT
SUT VIC AB 3-0 SH 27 (SUTURE) ×4
SUT VIC AB 3-0 SH 27X BRD (SUTURE) ×2 IMPLANT
SYR 20CC LL (SYRINGE) ×3 IMPLANT
SYR 3ML LL SCALE MARK (SYRINGE) ×3 IMPLANT

## 2015-09-02 NOTE — Anesthesia Procedure Notes (Signed)
Procedure Name: LMA Insertion Performed by: Caia Lofaro Pre-anesthesia Checklist: Patient identified, Patient being monitored, Timeout performed, Emergency Drugs available and Suction available Patient Re-evaluated:Patient Re-evaluated prior to inductionOxygen Delivery Method: Circle system utilized Preoxygenation: Pre-oxygenation with 100% oxygen Intubation Type: IV induction Ventilation: Mask ventilation without difficulty LMA: LMA inserted LMA Size: 3.5 Tube type: Oral Number of attempts: 1 Placement Confirmation: positive ETCO2 and breath sounds checked- equal and bilateral Tube secured with: Tape Dental Injury: Teeth and Oropharynx as per pre-operative assessment        

## 2015-09-02 NOTE — Progress Notes (Signed)
PT Cancellation Note  Patient Details Name: Teresa Lutz MRN: [id number] DOB: June 11, 1935   Cancelled Treatment:    Reason Eval/Treat Not Completed: Medical issues which prohibited therapy.  Nursing requested that pt be held from PT due to NPO orders and planned procedure today.  Pt will be seen at a later time and date.  Mittie Bodo, SPT Mittie Bodo 09/02/2015, 9:24 AM

## 2015-09-02 NOTE — Progress Notes (Signed)
A &O but forgetful at times. Telephone consent was given by daughter Teresa Lutz for AV fistula placement. No pain. NPO. Room air. A fib. Daughter Teresa Lutz was updated on plan of care. Pt has no further concerns at this time.

## 2015-09-02 NOTE — Progress Notes (Signed)
81 yr old with perforated colon but wihtout abdominal pain.  She remained without pain throughout today.  I saw her mulitple times and in PReop and PACU.  She has continued to be without pain.   Filed Vitals:   09/02/15 1740 09/02/15 1755  BP: 108/70 111/63  Pulse: 106 105  Temp: 97.9 F (36.6 C) 98.1 F (36.7 C)  Resp: 13 18   PE:  Gen: NAD Res: CTAB/L  Cardio: RRR Abd: soft, distended, non-tender  Ext: 1+ edema  CBC Latest Ref Rng 09/02/2015 09/01/2015 08/31/2015  WBC 3.6 - 11.0 K/uL 13.9(H) 12.8(H) 12.9(H)  Hemoglobin 12.0 - 16.0 g/dL 9.6(L) 8.9(L) 8.9(L)  Hematocrit 35.0 - 47.0 % 29.8(L) 28.1(L) 27.5(L)  Platelets 150 - 440 K/uL 340 321 361   CMP Latest Ref Rng 09/02/2015 09/01/2015 08/31/2015  Glucose 65 - 99 mg/dL 192(H) 199(H) 202(H)  BUN 6 - 20 mg/dL 50(H) 65(H) 83(H)  Creatinine 0.44 - 1.00 mg/dL 2.79(H) 3.14(H) 3.39(H)  Sodium 135 - 145 mmol/L 137 139 138  Potassium 3.5 - 5.1 mmol/L 4.1 4.1 3.9  Chloride 101 - 111 mmol/L 98(L) 98(L) 98(L)  CO2 22 - 32 mmol/L 28 28 27   Calcium 8.9 - 10.3 mg/dL 10.4(H) 10.5(H) 10.5(H)    A/p:  81 yr old female with multiple medical issues and likely perforated diverticulitis that has walled itself off.  Given the fact taht she doesn't have pain would let her have a diet and see how she does.  Would recommend a bowel regimen to get her bowels moving, she may need enemas to get the rectum cleared.

## 2015-09-02 NOTE — Op Note (Signed)
OPERATIVE NOTE   PROCEDURE: left brachial axillary arteriovenous graft placement  PRE-OPERATIVE DIAGNOSIS: End Stage Renal Disease  POST-OPERATIVE DIAGNOSIS: End Stage Renal Disease  SURGEON: Schnier, Dolores Lory  ASSISTANT(S): Ms. Hezzie Bump  ANESTHESIA: general  ESTIMATED BLOOD LOSS: <50 cc  SPECIMEN(S):  none  INDICATIONS:   Teresa Lutz is a 81 y.o. female who presents with end stage renal disease.  The patient is scheduled for left brachial axillary AV graft placement.  The patient is aware the risks include but are not limited to: bleeding, infection, steal syndrome, nerve damage, ischemic monomelic neuropathy, failure to mature, and need for additional procedures.  The patient is aware of the risks of the procedure and elects to proceed forward.  DESCRIPTION: After full informed written consent was obtained from the patient, the patient was brought back to the operating room and placed supine upon the operating table.  Prior to induction, the patient received IV antibiotics.   After obtaining adequate anesthesia, the patient was then prepped and draped in the standard fashion for a left arm access procedure.    A linear incision was then created along the medial border of the biceps muscle just proximal to the antecubital crease and the brachial artery which was exposed through. The brachial artery was then looped proximally and distally with Silastic Vesseloops. Side branches were controlled with 4-0 silk ties.  Attention was then turned to the exposure of the axillary vein. Linear incision was then created medial to the proximal portion of the biceps at the level of the anterior axillary crease. The axillary vein was exposed and again looped proximally and distally with Silastic vessel loops. Associated tributaries were also controlled with Silastic Vesseloops.  The Gore tunneler was then delivered onto the field and a subcutaneous path was made from the arterial  incision to the venous incision. A 4-7 tapered PTFE propatent graft by Simeon Craft was then pulled through the subcutaneous tunnel. The arterial 4 mm portion was then approximated to the brachial artery. Brachial artery was controlled proximally and distally with the Silastic Vesseloops. Arteriotomy was made with an 11 blade scalpel and extended with Potts scissors and a 6-0 Prolene stay suture was placed. End graft to side brachial artery anastomosis was then fashioned with running CV 6 suture. Flushing maneuvers were performed suture line was hemostatic and the graft was then assessed for proper position and ease of future cannulation. Heparinized saline was infused into the vein and the graft was clamped with a vascular clamp. With the graft pressurized it was approximated to the axillary vein in its native bed and then marked with a surgical marker. The vein was then delivered into the surgical field and controlled with the Silastic vessel loops. Venotomy was then made with an 11 blade scalpel and extended with Potts scissors and a 6-0 Prolene suture was used as stay suture. The the graft was then sewn to the vein in an end graft to side vein fashion using running CV 6 suture.  Flushing maneuvers were performed and the artery was allowed to forward and back bleed.  Flow was then established through the AV graft  There was good  thrill in the venous outflow, and there was 1+ palpable radial pulse.  At this point, I irrigated out the surgical wounds.  There was no further active bleeding.  The subcutaneous tissue was reapproximated with a running stitch of 3-0 Vicryl.  The skin was then reapproximated with a running subcuticular stitch of 4-0 Vicryl.  The skin was then cleaned, dried, and reinforced with Dermabond.    The patient tolerated this procedure well.   COMPLICATIONS: None  CONDITION: [name]  Office: [phone]   09/02/2015, 4:53 PM

## 2015-09-02 NOTE — Anesthesia Postprocedure Evaluation (Signed)
Anesthesia Post Note  Patient: Engineer, civil (consulting)  Procedure(s) Performed: Procedure(s) (LRB): INSERTION OF ARTERIOVENOUS (AV) GORE-TEX GRAFT ARM (Left)  Patient location during evaluation: PACU Anesthesia Type: General Level of consciousness: awake Pain management: pain level controlled Vital Signs Assessment: post-procedure vital signs reviewed and stable Respiratory status: spontaneous breathing Cardiovascular status: blood pressure returned to baseline Anesthetic complications: no    Last Vitals:  Filed Vitals:   09/02/15 1332 09/02/15 1655  BP: 130/82 110/60  Pulse: 88 104  Temp: 36.3 C 36.6 C  Resp: 16 10    Last Pain:  Filed Vitals:   09/02/15 1656  PainSc: 0-No pain                 VAN STAVEREN,Verdene Creson

## 2015-09-02 NOTE — Progress Notes (Signed)
Dr. Delana Meyer notified re tele earlier that pt has IV in both arms, he advises ok and that surgery will be on left arm.   IV left arm d/c'd by [facility] preop as instructed by Dr. Delana Meyer when in to see patient.

## 2015-09-02 NOTE — Progress Notes (Signed)
H&P obtained from PAT (08/04/15 - Dr. Delana Meyer) and will place on chart when pt arrives to preop Consent on chart and side not listed per nurse "Junious Dresser" when received report from 2A - also, pt has IV on both arms.  Dr. Delana Meyer notified re IVs, and left side per Dr. Delana Meyer via ascom

## 2015-09-02 NOTE — Care Management (Signed)
AV Fistula placed today.  Reinforced the need for patient to sit for dialysis.  FAxed dialysis orders to [facility].  Patient has a chair at [facility] on [address] at 12:30p

## 2015-09-02 NOTE — Transfer of Care (Signed)
Immediate Anesthesia Transfer of Care Note  Patient: Teresa Lutz  Procedure(s) Performed: Procedure(s): INSERTION OF ARTERIOVENOUS (AV) GORE-TEX GRAFT ARM (Left)  Patient Location: PACU  Anesthesia Type:General  Level of Consciousness: awake  Airway & Oxygen Therapy: Patient Spontanous Breathing and Patient connected to face mask oxygen  Post-op Assessment: Report given to RN  Post vital signs: Reviewed  Last Vitals:  Filed Vitals:   09/02/15 1332 09/02/15 1655  BP: 130/82 110/60  Pulse: 88 104  Temp: 36.3 C 36.6 C  Resp: 16 10    Complications: No apparent anesthesia complications

## 2015-09-02 NOTE — Progress Notes (Signed)
[facility]  ROUNDING NOTE   Subjective:   Hemodialysis yesterday. Tolerated treatment well. UF of 3 litres.   Surgery consulted for free air in abdomen. Patient with benign examination.   Scheduled for AVF today  Objective:  Vital signs in last 24 hours:  Temp:  [97.6 F (36.4 C)-98.4 F (36.9 C)] 97.9 F (36.6 C) (03/10 0450) Pulse Rate:  [59-113] 85 (03/10 0450) Resp:  [10-22] 22 (03/10 0450) BP: (108-132)/(67-86) 123/86 mmHg (03/10 0450) SpO2:  [96 %-100 %] 100 % (03/10 0450) Weight:  [93.8 kg (206 lb 12.7 oz)-96.9 kg (213 lb 10 oz)] 93.895 kg (207 lb) (03/10 0450)  Weight change: -2.9 kg (-6 lb 6.3 oz) Filed Weights   09/01/15 1250 09/01/15 1605 09/02/15 0450  Weight: 96.9 kg (213 lb 10 oz) 93.8 kg (206 lb 12.7 oz) 93.895 kg (207 lb)    Intake/Output: I/O last 3 completed shifts: In: -  Out: [id number] [Urine:550; Other:3000]   Intake/Output this shift:     Physical Exam: General: NAD, resting in bed  Head: Moist oral mucosal membranes  Eyes: Anicteric   Neck: Supple, trachea midline  Lungs:  basiilar rales, normal effort  Heart: S1S2 irregular  Abdomen:  Soft, nontender, BS [resemt   Extremities: 2+ peripheral edema.  Neurologic: Nonfocal, moving all four extremities. Able to follow commands  Skin: No lesions  Access:  RIJ permcath 3/6 Dr. Lucky Cowboy    Basic Metabolic Panel:  Recent Labs Lab 08/27/15 0615 08/28/15 0447 08/29/15 [id number] 08/29/15 1726 08/30/15 0752 08/31/15 0457 09/01/15 0429 09/02/15 0409  NA 136 139 138 136 137 138 139 137  K 3.3* 4.0 3.7 3.6 3.9 3.9 4.1 4.1  CL 98* 100* 98* 94* 97* 98* 98* 98*  CO2 24 23 22 22 26 27 28 28   GLUCOSE 145* 129* 157* 229* 202* 202* 199* 192*  BUN 103* 116* 123* 128* 110* 83* 65* 50*  CREATININE 4.37*  TEST WILL BE CREDITED 4.45* 4.60* 4.60* 4.02* 3.39* 3.14* 2.79*  CALCIUM 10.2 10.7* 10.8* 10.5* 10.4* 10.5* 10.5* 10.4*  MG 2.1 2.1 2.2  --   --   --   --   --   PHOS 5.3*  --   --  8.0*  --   --   5.0*  --     Liver Function Tests:  Recent Labs Lab 08/29/15 1726  ALBUMIN 3.8   No results for input(s): LIPASE, AMYLASE in the last 168 hours. No results for input(s): AMMONIA in the last 168 hours.  CBC:  Recent Labs Lab 08/29/15 1430 08/30/15 0147 08/30/15 0752 08/31/15 0457 09/01/15 0429 09/02/15 0409  WBC 12.0* 13.2* 10.2 12.9* 12.8* 13.9*  NEUTROABS 11.6* 12.4*  --   --   --   --   HGB 7.2* 8.7* 8.9* 8.9* 8.9* 9.6*  HCT 22.1* 26.0* 26.9* 27.5* 28.1* 29.8*  MCV 86.6 85.0 85.8 85.7 88.7 86.7  PLT 341 334 341 361 321 340    Cardiac Enzymes: No results for input(s): CKTOTAL, CKMB, CKMBINDEX, TROPONINI in the last 168 hours.  BNP: Invalid input(s): POCBNP  CBG:  Recent Labs Lab 09/01/15 1629 09/01/15 2052 09/02/15 0738  GLUCAP 173* 179* 192*    Microbiology: Results for orders placed or performed during the hospital encounter of 08/22/15  MRSA PCR Screening     Status: None   Collection Time: 08/22/15  6:36 PM  Result Value Ref Range Status   MRSA by PCR NEGATIVE NEGATIVE Final    Comment:  The GeneXpert MRSA Assay (FDA approved for NASAL specimens only), is one component of a comprehensive MRSA colonization surveillance program. It is not intended to diagnose MRSA infection nor to guide or monitor treatment for MRSA infections.   Urine culture     Status: None   Collection Time: 08/24/15 11:18 AM  Result Value Ref Range Status   Specimen Description URINE, RANDOM  Final   Special Requests NONE  Final   Culture >=100,000 COLONIES/mL KLEBSIELLA PNEUMONIAE  Final   Report Status 08/27/2015 FINAL  Final   Organism ID, Bacteria KLEBSIELLA PNEUMONIAE  Final      Susceptibility   Klebsiella pneumoniae - MIC*    AMPICILLIN RESISTANT Resistant     CEFAZOLIN <=4 SENSITIVE Sensitive     CEFTRIAXONE <=1 SENSITIVE Sensitive     CIPROFLOXACIN <=0.25 SENSITIVE Sensitive     GENTAMICIN <=1 SENSITIVE Sensitive     IMIPENEM <=0.25 SENSITIVE  Sensitive     NITROFURANTOIN 64 INTERMEDIATE Intermediate     TRIMETH/SULFA <=20 SENSITIVE Sensitive     AMPICILLIN/SULBACTAM <=2 SENSITIVE Sensitive     PIP/TAZO <=4 SENSITIVE Sensitive     Extended ESBL NEGATIVE Sensitive     * >=100,000 COLONIES/mL KLEBSIELLA PNEUMONIAE    Coagulation Studies: No results for input(s): LABPROT, INR in the last 72 hours.  Urinalysis: No results for input(s): COLORURINE, LABSPEC, PHURINE, GLUCOSEU, HGBUR, BILIRUBINUR, KETONESUR, PROTEINUR, UROBILINOGEN, NITRITE, LEUKOCYTESUR in the last 72 hours.  Invalid input(s): APPERANCEUR    Imaging: Ct Abdomen Pelvis Wo Contrast  09/01/2015  CLINICAL DATA:  Free intraperitoneal air on KUB.  No abdominal pain EXAM: CT ABDOMEN AND PELVIS WITHOUT CONTRAST TECHNIQUE: Multidetector CT imaging of the abdomen and pelvis was performed following the standard protocol without IV contrast. COMPARISON:  09/01/2015 FINDINGS: Lower chest: Elevated right hemidiaphragm with right lower lobe atelectasis. No pleural effusion. Left lung clear. Mild cardiac enlargement. Coronary calcification. Hemodialysis catheter at the cavoatrial junction. Hepatobiliary: Liver normal in size and contour without focal lesion. Gallbladder and bile ducts normal. Pancreas: No pancreatic mass or edema. There is free air in the peritoneal cavity as well as around the pancreas and in the mesentery. There is gas just above the pancreatic head. No definite duodenal edema to suggest perforated ulcer however this is in the differential. Spleen: Negative Adrenals/Urinary Tract: Multiple small nonobstructing bilateral renal calculi. 3.7 cm cyst left kidney posteriorly. Small cyst right kidney. No renal mass. Bilateral adrenal enlargement likely due to adrenal adenomata. Left adrenal nodule measures 19 x 29 mm. Urinary bladder normal. Stomach/Bowel: No oral contrast or IV contrast administered. The stomach is nondilated. There is no bowel edema although oral contrast  would be helpful in evaluation of the bowel given the amount of free air. There is gas in the retroperitoneum near the pancreas as well as in the mesentery and in the peritoneal cavity. No bowel obstruction. Extensive pan diverticulosis of the colon. No evidence of diverticulitis. There is a large amount of stool in the colon including the rectum. There is a large amount of gas in the left mesentery medial to the left colon. This may be the area perforation, possibly due to perforated diverticulum. Vascular/Lymphatic: Negative for aortic aneurysm. No lymphadenopathy. Reproductive: Calcified uterine fibroid on the left. No pelvic mass. Other: No free fluid.  No abscess. Musculoskeletal: Mild compression fracture L1 of indeterminate age. Disc degeneration and spurring in the lumbar spine. Mild to moderate joint space narrowing in both hip joints. IMPRESSION: Pneumoperitoneum. Moderate amount of free air. There  is also gas in the retroperitoneum around the pancreas and gas in the mesentery. Pneumoperitoneum may be related to perforated diverticulosis of the left colon. No evidence of duodenum edema to suggest perforated ulcer disease. Surgical consultation recommended. Extensive stool throughout the colon. Critical Value/emergent results were called by telephone at the time of interpretation on 09/01/2015 at 6:55 pm to Dr. Vianne Bulls , who verbally acknowledged these results. Electronically Signed   By: Franchot Gallo M.D.   On: 09/01/2015 18:55   Dg Abd 1 View  09/01/2015  CLINICAL DATA:  Constipation, abdominal pain EXAM: ABDOMEN - 1 VIEW COMPARISON:  None. FINDINGS: There appears to be free intraperitoneal air within the abdomen. I also suspect pneumatosis intestinalis of the left colon. Bowel gas pattern is nonobstructive. Large amount of stool is seen throughout the nondistended colon compatible with the given history of constipation. No soft tissue mass or abnormal fluid collection seen. Lung bases are clear.  IMPRESSION: Findings highly suggestive of free intraperitoneal air within the abdomen and/or free retroperitoneal air. In the absence of a recent surgery, this almost certainly indicates bowel perforation. I also suspect pneumatosis intestinalis of the left colon which raises the possibility of associated bowel ischemia. Critical Value/emergent results were called by telephone at the time of interpretation on 09/01/2015 at 4:30 pm to Dr. Ulice Bold MODY , who verbally acknowledged these results. Electronically Signed   By: Franki Cabot M.D.   On: 09/01/2015 16:35     Medications:     .  ceFAZolin (ANCEF) IV  2 g Intravenous On Call to OR  . diltiazem  120 mg Oral Daily  . ferrous sulfate  325 mg Oral Q breakfast  . insulin aspart  0-9 Units Subcutaneous TID WC  . letrozole  2.5 mg Oral Daily  . levothyroxine  25 mcg Oral QAC breakfast  . metoprolol  150 mg Oral BID  . polyethylene glycol  17 g Oral Daily  . predniSONE  40 mg Oral Q breakfast  . senna-docusate  2 tablet Oral BID  . sodium chloride flush  3 mL Intravenous Q12H   sodium chloride, acetaminophen **OR** acetaminophen, albuterol, hydrALAZINE, ondansetron **OR** ondansetron (ZOFRAN) IV, oxyCODONE-acetaminophen, sodium chloride flush  Assessment/ Plan:  81 y.o. female with atrial fibrillation, anemia, coronary artery disease, diabetes mellitus type 2, hypertension, hyperlipidemia, history of breast cancer, CKD stage IV/V who presents with increasing shortness of breath and edema.  1. End stage renal disease: first dialysis on 08/29/15. RIJ permcath. AVF to be placed on this admission. She is medically cleared.  Tolerating hemodialysis treatments well.   - Start discharge planning. [place] follow with [facility]  2. Hypertension and Diastolic Congestive Heart Failure with lower extremity edema and anasarca. Remains 2+ on exam today. Echocardiogram from 05/02/15.   3. Anemia of chronic kidney disease. Patient was followed by Dr.  Grayland Ormond as an outpatient. Status post 1 unit PRBC 08/29/15  - EPO with HD treatment.   4. Secondary hyperparathyroidism: with hypercalcemia: phosphorus at goal. PTH 378 holding calcitriol due to hypercalcemia.  - will look into cinacalcet as outpatient.    LOS: [place], [place] 3/10/20179:55 AM

## 2015-09-02 NOTE — Anesthesia Preprocedure Evaluation (Addendum)
Anesthesia Evaluation  Patient identified by MRN, date of birth, ID band Patient awake    Reviewed: Allergy & Precautions, NPO status , Patient's Chart, lab work & pertinent test results, reviewed documented beta blocker date and time   Airway Mallampati: II  TM Distance: >3 FB     Dental  (+) Chipped   Pulmonary sleep apnea and Continuous Positive Airway Pressure Ventilation ,           Cardiovascular hypertension, Pt. on medications and Pt. on home beta blockers +CHF  + dysrhythmias Atrial Fibrillation      Neuro/Psych    GI/Hepatic   Endo/Other  Hypothyroidism   Renal/GU ESRFRenal disease     Musculoskeletal  (+) Arthritis ,   Abdominal   Peds  Hematology  (+) anemia ,   Anesthesia Other Findings Has periods of confusion according to family. Anemic 9.6. Will use CPAP tonite.   Reproductive/Obstetrics                           Anesthesia Physical Anesthesia Plan  ASA: III  Anesthesia Plan: General   Post-op Pain Management:    Induction: Intravenous  Airway Management Planned: LMA  Additional Equipment:   Intra-op Plan:   Post-operative Plan:   Informed Consent: I have reviewed the patients History and Physical, chart, labs and discussed the procedure including the risks, benefits and alternatives for the proposed anesthesia with the patient or authorized representative who has indicated his/her understanding and acceptance.     Plan Discussed with: CRNA  Anesthesia Plan Comments:         Anesthesia Quick Evaluation

## 2015-09-02 NOTE — Progress Notes (Signed)
[facility] NAME: Teresa Lutz    MR#:  [id number]  DATE OF BIRTH:  1935/01/04  SUBJECTIVE:   CT scan did show free intraperitoneal air. She is not c/o pain Disimpacted by surgeon  REVIEW OF SYSTEMS:  She has not the best historian  CONSTITUTIONAL: No fever, fatigue or weakness. Decreased appetite EYES: No blurred or double vision.  EARS, NOSE, AND THROAT: No tinnitus or ear pain.  RESPIRATORY: No cough, shortness of breath, wheezing or hemoptysis.  CARDIOVASCULAR: No chest pain, positive orthopnea, positive edema.  GASTROINTESTINAL: No nausea, vomiting, diarrhea or abdominal pain.  GENITOURINARY: No dysuria, hematuria.  ENDOCRINE: No polyuria, nocturia,  HEMATOLOGY: No anemia, easy bruising or bleeding SKIN: No rash or lesion. MUSCULOSKELETAL: No joint pain or arthritis.   NEUROLOGIC: No tingling, numbness, weakness.  PSYCHIATRY: No anxiety or depression.   DRUG ALLERGIES:   Allergies  Allergen Reactions  . Celebrex [Celecoxib] Hives    VITALS:  Blood pressure 129/70, pulse 85, temperature 98.1 F (36.7 C), temperature source Oral, resp. rate 18, height 5\' 7"  (1.702 m), weight 93.895 kg (207 lb), SpO2 100 %.  PHYSICAL EXAMINATION:  VITAL SIGNS: Filed Vitals:   09/02/15 0450 09/02/15 1104  BP: 123/86 129/70  Pulse: 85 85  Temp: 97.9 F (36.6 C) 98.1 F (36.7 C)  Resp: 71 18   GENERAL:81 y.o.female not in acute distress HEAD: Normocephalic, atraumatic.  EYES: Pupils equal, round, reactive to light. Extraocular muscles intact. No scleral icterus.  MOUTH: Moist mucosal membrane. Dentition intact. No abscess noted.  EAR, NOSE, THROAT: Clear without exudates. No external lesions.  NECK: Supple. No thyromegaly. No nodules. No JVD.  PULMONARY: No wheeze rails or rhonci. No use of accessory muscles, Good respiratory effort. good air entry bilaterally CHEST: Nontender to palpation.  CARDIOVASCULAR: irr,  irr. No murmurs, rubs, or gallops. 2+ edema.  GASTROINTESTINAL: Soft, nontender, nondistended. No masses. Positive bowel sounds. No hepatosplenomegaly.  MUSCULOSKELETAL:  Range of motion decreased in all extremities.  NEUROLOGIC: Cranial nerves II through XII are intact. No gross focal neurological deficits. SKIN: No ulceration, lesions, rashes, or cyanosis. Skin warm and dry. Turgor intact.  PSYCHIATRIC: Mood, affect blunted,  Access: Right internal jugular vein tunneled HD cathetar placed on 3/6  LABORATORY PANEL:   CBC  Recent Labs Lab 09/02/15 0409  WBC 13.9*  HGB 9.6*  HCT 29.8*  PLT 340   ------------------------------------------------------------------------------------------------------------------  Chemistries   Recent Labs Lab 08/29/15 0605  09/02/15 0409  NA 138  < > 137  K 3.7  < > 4.1  CL 98*  < > 98*  CO2 22  < > 28  GLUCOSE 157*  < > 192*  BUN 123*  < > 50*  CREATININE 4.60*  < > 2.79*  CALCIUM 10.8*  < > 10.4*  MG 2.2  --   --   < > = values in this interval not displayed.   ASSESSMENT AND PLAN:   81 year old African female history of atrial fibrillation, chronic kidney disease stage V, congestive heart failure ejection fraction 60-65 presenting with worsening edema  1. Acute on chronic diastolic congestive heart failure: This is multifactorial due to worsening of renal function, anemia and low albumin. Continue hemodialysis as scheduled by nephrology  2.. New onset perforated viscus: Patient has no symptoms or abdominal findings. Continue her conditions as per surgery. No acute indication for surgery. Continue to follow serial x-rays.   3. Atrial fibrillation rapid ventricular response:  Heart rates are better controlled. Continue diltiazem and metoprolol.   4.End stage renal disease: s/p  tunneled hemodialysis catheter via the right internal jugular vein by Dr Lucky Cowboy on 3/6 -  She has had 3 dialysis sessions and case management should be  assisting on outpatient dialysis placement. Plan for AV graft as per vascular on Friday. Hemodialysis is planned per nephrology  5 Hypokalemia: Improved 6. Urinary tract infection site unspecified due to klebsiella pneumoniae: Treated with keflex   7. Hypothyroidism unspecified: Continue Synthroid  8. Hypertension essential: Blood pressure control. Continue metoprolol, diltiazem   9 Venous thromboembolism prophylactic: off Eliquis considering gradual decline in Hb, continue heparin SQ 10 uremic encephalopathy: Seems to be at baseline 11 hand pain: Improved with gabapentin 12 anemia of chronic disease:EPO as per nephrology Hemoglobin stable 8.9 13. Generalized weakness: PT consult pending for disposition 14 constipation: Patient was disimpacted by Dr Heath Lark Continue laxatives    Management plans discussed with the patient and she is in agreement. Discussed with Dr Abigail Butts and Dr Heath Lark CODE STATUS: Full  TOTAL TIME TAKING CARE OF THIS PATIENT: 25 minutes.   POSSIBLE D/C IN 2-3 DAYS, DEPENDING ON CLINICAL CONDITION. Will need outpt HD set up    Gearldene Fiorenza M.D on 09/02/2015 at 11:27 AM  Between 7am to 6pm - Pager - [phone]  After 6pm: House Pager: - [facility]  Office  [phone]  CC: Primary care physician; Casilda Carls, MD

## 2015-09-03 LAB — BASIC METABOLIC PANEL
ANION GAP: 13 (ref 5–15)
BUN: 73 mg/dL — ABNORMAL HIGH (ref 6–20)
CHLORIDE: 95 mmol/L — AB (ref 101–111)
CO2: 27 mmol/L (ref 22–32)
Calcium: 10.7 mg/dL — ABNORMAL HIGH (ref 8.9–10.3)
Creatinine, Ser: 3.88 mg/dL — ABNORMAL HIGH (ref 0.44–1.00)
GFR calc non Af Amer: 10 mL/min — ABNORMAL LOW (ref >60)
GFR, EST AFRICAN AMERICAN: 12 mL/min — AB (ref >60)
GLUCOSE: 182 mg/dL — AB (ref 65–99)
POTASSIUM: 4.7 mmol/L (ref 3.5–5.1)
SODIUM: 135 mmol/L (ref 135–145)

## 2015-09-03 LAB — CBC
HCT: 30.2 % — ABNORMAL LOW (ref 35.0–47.0)
Hemoglobin: 9.7 g/dL — ABNORMAL LOW (ref 12.0–16.0)
MCH: 28.4 pg (ref 26.0–34.0)
MCHC: 32 g/dL (ref 32.0–36.0)
MCV: 88.8 fL (ref 80.0–100.0)
Platelets: 354 10*3/uL (ref 150–440)
RBC: 3.4 MIL/uL — ABNORMAL LOW (ref 3.80–5.20)
RDW: 16.5 % — ABNORMAL HIGH (ref 11.5–14.5)
WBC: 13.6 10*3/uL — ABNORMAL HIGH (ref 3.6–11.0)

## 2015-09-03 LAB — GLUCOSE, CAPILLARY
GLUCOSE-CAPILLARY: 130 mg/dL — AB (ref 65–99)
Glucose-Capillary: 200 mg/dL — ABNORMAL HIGH (ref 65–99)
Glucose-Capillary: 200 mg/dL — ABNORMAL HIGH (ref 65–99)

## 2015-09-03 MED ORDER — EPOETIN ALFA 10000 UNIT/ML IJ SOLN
10000.0000 [IU] | INTRAMUSCULAR | Status: DC
  Administered 2015-09-03 – 2015-09-08 (×3): 10000 [IU] via INTRAVENOUS

## 2015-09-03 MED ORDER — ALBUMIN HUMAN 25 % IV SOLN
12.5000 g | Freq: Once | INTRAVENOUS | Status: AC
  Administered 2015-09-03: 12.5 g via INTRAVENOUS
  Filled 2015-09-03 (×2): qty 50

## 2015-09-03 NOTE — Progress Notes (Signed)
Physical Therapy Treatment Patient Details Name: Teresa Lutz MRN: [id number] DOB: [birth year] Today's Date: 09/03/2015    History of Present Illness Pt admitted for acute on chronic diastolic heart failure. Pt with complaints of leg swelling and tachycardia. Pt with history of ESRD and Afib. Per family report, history of recent falls.    PT Comments    Pt is able to do some supine bed exercises needing a lot of encouragement and cuing.  She initially shows good effort in getting to EOB and attempting standing but quickly becomes very fatigued and ultimately is unable to even get to full standing despite max assist.  Pt is very weak and reports she is too tired to even try standing a second time.   Follow Up Recommendations  SNF     Equipment Recommendations       Recommendations for Other Services       Precautions / Restrictions Precautions Precautions: Fall Restrictions Weight Bearing Restrictions: No    Mobility  Bed Mobility Overal bed mobility: Needs Assistance Bed Mobility: Supine to Sit;Sit to Supine     Supine to sit: Mod assist Sit to supine: Max assist   General bed mobility comments: Pt shows good effort in getting to EOB, but is weak and needs considerable assist.  Pt very tired after attempted standing, needs max assist to get back to supine  Transfers Overall transfer level: Needs assistance Equipment used: Rolling walker (2 wheeled) Transfers: Sit to/from Stand Sit to Stand: Max assist;Total assist         General transfer comment: Pt is unable to get to full standing though she does make a good effort trying to get upright.  She has difficulty using her UEs appropriately to assist and becomes very fatigue with even minimal standing attempt.  Ambulation/Gait             General Gait Details: unable at this time   Stairs            Wheelchair Mobility    Modified Rankin (Stroke Patients Only)       Balance                                     Cognition Arousal/Alertness: Awake/alert Behavior During Therapy: Flat affect Overall Cognitive Status: Within Functional Limits for tasks assessed                      Exercises General Exercises - Lower Extremity Ankle Circles/Pumps: AROM;Strengthening;10 reps;Both Quad Sets: Strengthening;10 reps;Both Gluteal Sets: Strengthening;10 reps;Both Heel Slides: AAROM;10 reps;Both Hip ABduction/ADduction: AAROM;AROM;10 reps;Both    General Comments        Pertinent Vitals/Pain Pain Assessment:  (general soreness, no specific complaints)    Home Living                      Prior Function            PT Goals (current goals can now be found in the care plan section) Progress towards PT goals: Progressing toward goals    Frequency  Min 2X/week    PT Plan Current plan remains appropriate    Co-evaluation             End of Session Equipment Utilized During Treatment: Gait belt Activity Tolerance: Patient limited by fatigue Patient left: with call bell/phone within reach;with bed alarm set  Time: [id number] PT Time Calculation (min) (ACUTE ONLY): 26 min  Charges:  $Therapeutic Exercise: 8-22 mins $Therapeutic Activity: 8-22 mins                    G Codes:     Wayne Both, PT, DPT [phone]  Kreg Shropshire 09/03/2015, 12:02 PM

## 2015-09-03 NOTE — Progress Notes (Signed)
Pt's HR jumping to 140s, returning to 110s. Pt asymptomatic, resting. MD Dr. Jannifer Franklin notified. Orders given to administer missed dose of PO cardizem (held for dialysis). RN will administer and continue to monitor. Rachael Fee, RN

## 2015-09-03 NOTE — Progress Notes (Signed)
[facility] [place] NAME: Teresa Lutz    MR#:  [id number]  DATE OF BIRTH:  1934-09-15  SUBJECTIVE:   Weakness, on HD.BP was low during HD. S/p left brachial axillary arteriovenous graft placement.  REVIEW OF SYSTEMS:   CONSTITUTIONAL: No fever, has weakness. EYES: No blurred or double vision.  EARS, NOSE, AND THROAT: No tinnitus or ear pain.  RESPIRATORY: No cough, shortness of breath, wheezing or hemoptysis.  CARDIOVASCULAR: No chest pain, positive orthopnea, positive edema.  GASTROINTESTINAL: No nausea, vomiting, diarrhea or abdominal pain.  GENITOURINARY: No dysuria, hematuria.  ENDOCRINE: No polyuria, nocturia,  HEMATOLOGY: No anemia, easy bruising or bleeding SKIN: No rash or lesion. MUSCULOSKELETAL: No joint pain or arthritis.   NEUROLOGIC: No tingling, numbness, weakness.  PSYCHIATRY: No anxiety or depression.   DRUG ALLERGIES:   Allergies  Allergen Reactions  . Celebrex [Celecoxib] Hives    VITALS:  Blood pressure 113/61, pulse 95, temperature 97.3 F (36.3 C), temperature source Oral, resp. rate 21, height 5\' 7"  (1.702 m), weight 92.1 kg (203 lb 0.7 oz), SpO2 97 %.  PHYSICAL EXAMINATION:  VITAL SIGNS: Filed Vitals:   09/03/15 1400 09/03/15 1552  BP:  113/61  Pulse:  95  Temp: 97.3 F (36.3 C)   Resp:     GENERAL:80 y.o.female not in acute distress. Obese. HEAD: Normocephalic, atraumatic.  EYES: Pupils equal, round, reactive to light. Extraocular muscles intact. No scleral icterus.  MOUTH: Moist mucosal membrane. Dentition intact. No abscess noted.  EAR, NOSE, THROAT: Clear without exudates. No external lesions.  NECK: Supple. No thyromegaly. No nodules. No JVD.  PULMONARY: No wheeze rails or rhonci. No use of accessory muscles, Good respiratory effort. good air entry bilaterally CHEST: Nontender to palpation.  CARDIOVASCULAR: irr, irr. No murmurs, rubs, or gallops. 2+ edema.  GASTROINTESTINAL:  Soft, nontender, nondistended. No masses. Positive bowel sounds. No hepatosplenomegaly.  MUSCULOSKELETAL:  Range of motion decreased in all extremities.  NEUROLOGIC: Cranial nerves II through XII are intact. No gross focal neurological deficits.  SKIN: No ulceration, lesions, rashes, or cyanosis. Skin warm and dry. Turgor intact.  PSYCHIATRIC: Mood, affect blunted,  Access: s/p left brachial axillary arteriovenous graft placement.  LABORATORY PANEL:   CBC  Recent Labs Lab 09/03/15 0548  WBC 13.6*  HGB 9.7*  HCT 30.2*  PLT 354   ------------------------------------------------------------------------------------------------------------------  Chemistries   Recent Labs Lab 08/29/15 0605  09/03/15 0548  NA 138  < > 135  K 3.7  < > 4.7  CL 98*  < > 95*  CO2 22  < > 27  GLUCOSE 157*  < > 182*  BUN 123*  < > 73*  CREATININE 4.60*  < > 3.88*  CALCIUM 10.8*  < > 10.7*  MG 2.2  --   --   < > = values in this interval not displayed.   ASSESSMENT AND PLAN:   81 year old African female history of atrial fibrillation, chronic kidney disease stage V, congestive heart failure ejection fraction 60-65 presenting with worsening edema  1. Acute on chronic diastolic congestive heart failure: This is multifactorial due to worsening of renal function, anemia and low albumin. Continue hemodialysis as scheduled by nephrology, T/T/S.  2.. New onset perforated viscus: Patient has no symptoms or abdominal findings. Conservative management as per surgery. No acute indication for surgery.  3. Atrial fibrillation rapid ventricular response: Heart rates are controlled. Continue diltiazem and metoprolol.  4.End stage renal disease: s/p  tunneled hemodialysis catheter  via the right internal jugular vein by Dr Lucky Cowboy on 3/6; S/p left brachial axillary arteriovenous graft placement on 3/10. She has had 3 dialysis sessions and case management should be assisting on outpatient dialysis  placement. Hemodialysis per nephrology  5 Hypokalemia: Improved 6. Urinary tract infection site unspecified due to klebsiella pneumoniae: Treated with keflex   7. Hypothyroidism unspecified: Continue Synthroid  8. Hypertension essential: Blood pressure control. Continue metoprolol, diltiazem   9 Venous thromboembolism prophylactic: off Eliquis considering gradual decline in Hb, continue heparin SQ 10 uremic encephalopathy: Seems to be at baseline 11 hand pain: Improved with gabapentin 12 anemia of chronic disease:EPO as per nephrology Hemoglobin stable 9.7.  13. Generalized weakness: PT consult pending for disposition 14 constipation: Patient was disimpacted by Dr Heath Lark Continue laxatives   Discussed with Dr Abigail Butts. All the records are reviewed and case discussed with Care Management/Social Workerr. Management plans discussed with the patient, family and they are in agreement.  CODE STATUS: Full code  TOTAL TIME TAKING CARE OF THIS PATIENT: 32 minutes.  Greater than 50% time was spent on coordination of care and face-to-face counseling.  POSSIBLE D/C IN 2-3 DAYS, DEPENDING ON CLINICAL CONDITION.    Teresa Lutz M.D on 09/03/2015 at 4:06 PM  Between 7am to 6pm - Pager - [phone]  After 6pm: House Pager: - [phone]  [facility]  Office  [phone]  CC: Primary care physician; Teresa Carls, MD

## 2015-09-03 NOTE — Progress Notes (Signed)
[facility]  ROUNDING NOTE   Subjective:   Seen and examined on hemodialysis. Tolerating treatment well.   Eating. Had bm last night. Denies any abdominal pain  Left arm AVF placed yesterday, Dr. Delana Meyer.   Objective:  Vital signs in last 24 hours:  Temp:  [97.4 F (36.3 C)-98.3 F (36.8 C)] 98.3 F (36.8 C) (03/11 0529) Pulse Rate:  [85-123] 123 (03/11 0529) Resp:  [10-21] 21 (03/11 0529) BP: (108-130)/(60-82) 117/62 mmHg (03/11 0529) SpO2:  [95 %-100 %] 97 % (03/11 0529) Weight:  [92.262 kg (203 lb 6.4 oz)-99.338 kg (219 lb)] 92.262 kg (203 lb 6.4 oz) (03/11 0529)  Weight change: 2.438 kg (5 lb 6 oz) Filed Weights   09/02/15 0450 09/02/15 1332 09/03/15 0529  Weight: 93.895 kg (207 lb) 99.338 kg (219 lb) 92.262 kg (203 lb 6.4 oz)    Intake/Output: I/O last 3 completed shifts: In: 400 [I.V.:400] Out: 305 [Urine:300; Blood:5]   Intake/Output this shift:     Physical Exam: General: NAD, resting in bed  Head: Moist oral mucosal membranes  Eyes: Anicteric   Neck: Supple, trachea midline  Lungs:  clear  Heart: irregular  Abdomen:  Soft, nontender, BS presemt   Extremities: 2+ peripheral edema in all four extremities  Neurologic: Nonfocal, moving all four extremities. Able to follow commands  Skin: No lesions  Access:  RIJ permcath 3/6 Dr. Lucky Cowboy, left arm AVF maturing.     Basic Metabolic Panel:  Recent Labs Lab 08/28/15 0447 08/29/15 0605 08/29/15 1726 08/30/15 0752 08/31/15 0457 09/01/15 0429 09/02/15 0409 09/03/15 0548  NA 139 138 136 137 138 139 137 135  K 4.0 3.7 3.6 3.9 3.9 4.1 4.1 4.7  CL 100* 98* 94* 97* 98* 98* 98* 95*  CO2 23 22 22 26 27 28 28 27   GLUCOSE 129* 157* 229* 202* 202* 199* 192* 182*  BUN 116* 123* 128* 110* 83* 65* 50* 73*  CREATININE 4.45* 4.60* 4.60* 4.02* 3.39* 3.14* 2.79* 3.88*  CALCIUM 10.7* 10.8* 10.5* 10.4* 10.5* 10.5* 10.4* 10.7*  MG 2.1 2.2  --   --   --   --   --   --   PHOS  --   --  8.0*  --   --  5.0*  --    --     Liver Function Tests:  Recent Labs Lab 08/29/15 1726  ALBUMIN 3.8   No results for input(s): LIPASE, AMYLASE in the last 168 hours. No results for input(s): AMMONIA in the last 168 hours.  CBC:  Recent Labs Lab 08/29/15 1430 08/30/15 0147 08/30/15 0752 08/31/15 0457 09/01/15 0429 09/02/15 0409 09/03/15 0548  WBC 12.0* 13.2* 10.2 12.9* 12.8* 13.9* 13.6*  NEUTROABS 11.6* 12.4*  --   --   --   --   --   HGB 7.2* 8.7* 8.9* 8.9* 8.9* 9.6* 9.7*  HCT 22.1* 26.0* 26.9* 27.5* 28.1* 29.8* 30.2*  MCV 86.6 85.0 85.8 85.7 88.7 86.7 88.8  PLT 341 334 341 361 321 340 354    Cardiac Enzymes: No results for input(s): CKTOTAL, CKMB, CKMBINDEX, TROPONINI in the last 168 hours.  BNP: Invalid input(s): POCBNP  CBG:  Recent Labs Lab 09/02/15 0738 09/02/15 1149 09/02/15 1713 09/02/15 2104 09/03/15 0730  GLUCAP 192* 157* 135* 167* 200*    Microbiology: Results for orders placed or performed during the hospital encounter of 08/22/15  MRSA PCR Screening     Status: None   Collection Time: 08/22/15  6:36 PM  Result  Value Ref Range Status   MRSA by PCR NEGATIVE NEGATIVE Final    Comment:        The GeneXpert MRSA Assay (FDA approved for NASAL specimens only), is one component of a comprehensive MRSA colonization surveillance program. It is not intended to diagnose MRSA infection nor to guide or monitor treatment for MRSA infections.   Urine culture     Status: None   Collection Time: 08/24/15 11:18 AM  Result Value Ref Range Status   Specimen Description URINE, RANDOM  Final   Special Requests NONE  Final   Culture >=100,000 COLONIES/mL KLEBSIELLA PNEUMONIAE  Final   Report Status 08/27/2015 FINAL  Final   Organism ID, Bacteria KLEBSIELLA PNEUMONIAE  Final      Susceptibility   Klebsiella pneumoniae - MIC*    AMPICILLIN RESISTANT Resistant     CEFAZOLIN <=4 SENSITIVE Sensitive     CEFTRIAXONE <=1 SENSITIVE Sensitive     CIPROFLOXACIN <=0.25 SENSITIVE  Sensitive     GENTAMICIN <=1 SENSITIVE Sensitive     IMIPENEM <=0.25 SENSITIVE Sensitive     NITROFURANTOIN 64 INTERMEDIATE Intermediate     TRIMETH/SULFA <=20 SENSITIVE Sensitive     AMPICILLIN/SULBACTAM <=2 SENSITIVE Sensitive     PIP/TAZO <=4 SENSITIVE Sensitive     Extended ESBL NEGATIVE Sensitive     * >=100,000 COLONIES/mL KLEBSIELLA PNEUMONIAE    Coagulation Studies: No results for input(s): LABPROT, INR in the last 72 hours.  Urinalysis: No results for input(s): COLORURINE, LABSPEC, PHURINE, GLUCOSEU, HGBUR, BILIRUBINUR, KETONESUR, PROTEINUR, UROBILINOGEN, NITRITE, LEUKOCYTESUR in the last 72 hours.  Invalid input(s): APPERANCEUR    Imaging: Ct Abdomen Pelvis Wo Contrast  09/01/2015  CLINICAL DATA:  Free intraperitoneal air on KUB.  No abdominal pain EXAM: CT ABDOMEN AND PELVIS WITHOUT CONTRAST TECHNIQUE: Multidetector CT imaging of the abdomen and pelvis was performed following the standard protocol without IV contrast. COMPARISON:  09/01/2015 FINDINGS: Lower chest: Elevated right hemidiaphragm with right lower lobe atelectasis. No pleural effusion. Left lung clear. Mild cardiac enlargement. Coronary calcification. Hemodialysis catheter at the cavoatrial junction. Hepatobiliary: Liver normal in size and contour without focal lesion. Gallbladder and bile ducts normal. Pancreas: No pancreatic mass or edema. There is free air in the peritoneal cavity as well as around the pancreas and in the mesentery. There is gas just above the pancreatic head. No definite duodenal edema to suggest perforated ulcer however this is in the differential. Spleen: Negative Adrenals/Urinary Tract: Multiple small nonobstructing bilateral renal calculi. 3.7 cm cyst left kidney posteriorly. Small cyst right kidney. No renal mass. Bilateral adrenal enlargement likely due to adrenal adenomata. Left adrenal nodule measures 19 x 29 mm. Urinary bladder normal. Stomach/Bowel: No oral contrast or IV contrast  administered. The stomach is nondilated. There is no bowel edema although oral contrast would be helpful in evaluation of the bowel given the amount of free air. There is gas in the retroperitoneum near the pancreas as well as in the mesentery and in the peritoneal cavity. No bowel obstruction. Extensive pan diverticulosis of the colon. No evidence of diverticulitis. There is a large amount of stool in the colon including the rectum. There is a large amount of gas in the left mesentery medial to the left colon. This may be the area perforation, possibly due to perforated diverticulum. Vascular/Lymphatic: Negative for aortic aneurysm. No lymphadenopathy. Reproductive: Calcified uterine fibroid on the left. No pelvic mass. Other: No free fluid.  No abscess. Musculoskeletal: Mild compression fracture L1 of indeterminate age. Disc degeneration and  spurring in the lumbar spine. Mild to moderate joint space narrowing in both hip joints. IMPRESSION: Pneumoperitoneum. Moderate amount of free air. There is also gas in the retroperitoneum around the pancreas and gas in the mesentery. Pneumoperitoneum may be related to perforated diverticulosis of the left colon. No evidence of duodenum edema to suggest perforated ulcer disease. Surgical consultation recommended. Extensive stool throughout the colon. Critical Value/emergent results were called by telephone at the time of interpretation on 09/01/2015 at 6:55 pm to Dr. Vianne Bulls , who verbally acknowledged these results. Electronically Signed   By: Franchot Gallo M.D.   On: 09/01/2015 18:55   Dg Abd 1 View  09/01/2015  CLINICAL DATA:  Constipation, abdominal pain EXAM: ABDOMEN - 1 VIEW COMPARISON:  None. FINDINGS: There appears to be free intraperitoneal air within the abdomen. I also suspect pneumatosis intestinalis of the left colon. Bowel gas pattern is nonobstructive. Large amount of stool is seen throughout the nondistended colon compatible with the given history of  constipation. No soft tissue mass or abnormal fluid collection seen. Lung bases are clear. IMPRESSION: Findings highly suggestive of free intraperitoneal air within the abdomen and/or free retroperitoneal air. In the absence of a recent surgery, this almost certainly indicates bowel perforation. I also suspect pneumatosis intestinalis of the left colon which raises the possibility of associated bowel ischemia. Critical Value/emergent results were called by telephone at the time of interpretation on 09/01/2015 at 4:30 pm to Dr. Ulice Bold MODY , who verbally acknowledged these results. Electronically Signed   By: Franki Cabot M.D.   On: 09/01/2015 16:35     Medications:     . diltiazem  120 mg Oral Daily  . ferrous sulfate  325 mg Oral Q breakfast  . insulin aspart  0-9 Units Subcutaneous TID WC  . letrozole  2.5 mg Oral Daily  . levothyroxine  25 mcg Oral QAC breakfast  . metoprolol  150 mg Oral BID  . polyethylene glycol  17 g Oral Daily  . predniSONE  30 mg Oral Daily   Followed by  . [START ON 09/04/2015] predniSONE  20 mg Oral Daily   Followed by  . [START ON 09/05/2015] predniSONE  10 mg Oral Daily  . senna-docusate  2 tablet Oral BID  . sodium chloride flush  3 mL Intravenous Q12H   sodium chloride, acetaminophen **OR** acetaminophen, albuterol, hydrALAZINE, ondansetron **OR** ondansetron (ZOFRAN) IV, oxyCODONE-acetaminophen, sodium chloride flush  Assessment/ Plan:  81 y.o. female with atrial fibrillation, anemia, coronary artery disease, diabetes mellitus type 2, hypertension, hyperlipidemia, history of breast cancer, CKD stage IV/V who presents with increasing shortness of breath and edema.  1. End stage renal disease: first dialysis on 08/29/15. RIJ permcath. AVF placed yesterday (3/10) Tolerating hemodialysis treatments well.  Next treatment for Tuesday - Start discharge planning. Reader follow with [facility]  2. Hypertension and Diastolic Congestive Heart Failure with lower  extremity edema and anasarca. Also with atrial fibrillation. Remains 2+ on exam today. Echocardiogram from 05/02/15.  - Ultrafiltration with dialysis today  3. Anemia of chronic kidney disease: hemoglobin 9.7 - EPO with HD treatment.   4. Secondary hyperparathyroidism: with hypercalcemia: phosphorus at goal. PTH 378 holding calcitriol due to hypercalcemia.  - will look into cinacalcet as outpatient.    LOS: [place], [place] 3/11/201710:36 AM

## 2015-09-03 NOTE — Progress Notes (Signed)
[facility]  Daily Progress Note  Subjective: 1 Day Post-Op: Left Brachial Axillary Arteriovenous Graft Placement  Patient seen in hemodialysis. States some soreness in left arm otherwise without complaint. Permcath working well.   Objective: Filed Vitals:   09/02/15 1957 09/03/15 0529 09/03/15 0529 09/03/15 1030  BP: 110/69 117/62    Pulse: 101 123    Temp: 98.3 F (36.8 C) 98.2 F (36.8 C) 98.3 F (36.8 C) 97.9 F (36.6 C)  TempSrc:  Oral Oral Oral  Resp: 16 21    Height:      Weight:  92.262 kg (203 lb 6.4 oz)  92.9 kg (204 lb 12.9 oz)  SpO2: 96% 97%      Intake/Output Summary (Last 24 hours) at 09/03/15 1320 Last data filed at 09/03/15 0830  Gross per 24 hour  Intake    400 ml  Output    305 ml  Net     95 ml   Physical Exam: A&Ox3, NAD Right Permcath: Being used for dialysis. No signs of infection.  CV: RRR Pulmonary: CTA Bilaterally Abdomen: Soft, Nontender, Nondistended Vascular: Left Upper Extremity: Incision intact with Dermabond. Some edema noted. 1+ radial pulse noted. Hand Warm. Palpable thrill, audible bruit noted.    Laboratory: CBC    Component Value Date/Time   WBC 13.6* 09/03/2015 0548   WBC 6.6 03/24/2015 1403   WBC 9.0 03/18/2014 1504   HGB 9.7* 09/03/2015 0548   HGB 10.4* 03/18/2014 1504   HCT 30.2* 09/03/2015 0548   HCT 29.2* 03/24/2015 1403   HCT 31.6* 03/18/2014 1504   PLT 354 09/03/2015 0548   PLT 339 03/24/2015 1403   PLT 285 03/18/2014 1504   BMET    Component Value Date/Time   NA 135 09/03/2015 0548   NA 144 03/24/2015 1403   NA 139 03/18/2014 1504   K 4.7 09/03/2015 0548   K 2.8* 03/18/2014 1504   CL 95* 09/03/2015 0548   CL 100 03/18/2014 1504   CO2 27 09/03/2015 0548   CO2 30 03/18/2014 1504   GLUCOSE 182* 09/03/2015 0548   GLUCOSE 138* 03/24/2015 1403   GLUCOSE 138* 03/18/2014 1504   BUN 73* 09/03/2015 0548   BUN 65* 03/24/2015 1403   BUN 74* 03/18/2014 1504   CREATININE 3.88* 09/03/2015  0548   CREATININE 3.67* 03/18/2014 1504   CALCIUM 10.7* 09/03/2015 0548   CALCIUM 9.8 03/18/2014 1504   GFRNONAA 10* 09/03/2015 0548   GFRNONAA 13* 03/18/2014 1504   GFRAA 12* 09/03/2015 0548   GFRAA 15* 03/18/2014 1504   Assessment/Planning: 81 year old female POD #1 - Left Brachial Axillary Arteriovenous Graft Placement - doing well. 1) Patient to follow up in our office in about four weeks 2) Dialyze through Permcath until then. 3) Discussed with Dr. Lucky Cowboy - will sign off for now. 4) Please re-consult if needed  Marcelle Overlie PA-C 09/03/2015 1:20 PM

## 2015-09-04 LAB — GLUCOSE, CAPILLARY
GLUCOSE-CAPILLARY: 166 mg/dL — AB (ref 65–99)
Glucose-Capillary: 202 mg/dL — ABNORMAL HIGH (ref 65–99)
Glucose-Capillary: 138 mg/dL — ABNORMAL HIGH (ref 65–99)
Glucose-Capillary: 164 mg/dL — ABNORMAL HIGH (ref 65–99)

## 2015-09-04 MED ORDER — AMIODARONE HCL IN DEXTROSE 360-4.14 MG/200ML-% IV SOLN
30.0000 mg/h | INTRAVENOUS | Status: DC
  Administered 2015-09-04 – 2015-09-05 (×3): 30 mg/h via INTRAVENOUS
  Filled 2015-09-04 (×4): qty 200

## 2015-09-04 MED ORDER — AMIODARONE HCL IN DEXTROSE 360-4.14 MG/200ML-% IV SOLN
60.0000 mg/h | INTRAVENOUS | Status: AC
  Administered 2015-09-04: 60 mg/h via INTRAVENOUS
  Filled 2015-09-04: qty 200

## 2015-09-04 MED ORDER — DILTIAZEM HCL ER COATED BEADS 180 MG PO CP24: 180.0000 mg | ORAL_CAPSULE | Freq: Every day | ORAL | Status: DC

## 2015-09-04 NOTE — Progress Notes (Signed)
Pt's HR continuing to jump to 130s and occasionally 140s. MD Dr. Jannifer Franklin notified. Orders given to start amiodarone drip with no bolus for a goal HR of less than 110. RN will initiate drip and continue to monitor. Rachael Fee, RN

## 2015-09-04 NOTE — Progress Notes (Signed)
[facility] [place] NAME: Teresa Lutz    MR#:  [id number]  DATE OF BIRTH:  [birth year]  SUBJECTIVE:   No appetite, poor oral intake and weakness. Afib RVR last night, put on amiodarone drip. Rate controlled now.  REVIEW OF SYSTEMS:   CONSTITUTIONAL: No fever, No appetite, poor oral intake and weakne EYES: No blurred or double vision.  EARS, NOSE, AND THROAT: No tinnitus or ear pain.  RESPIRATORY: No cough, shortness of breath, wheezing or hemoptysis.  CARDIOVASCULAR: No chest pain, positive orthopnea, positive edema.  GASTROINTESTINAL: No nausea, vomiting, diarrhea or abdominal pain.  GENITOURINARY: No dysuria, hematuria.  ENDOCRINE: No polyuria, nocturia,  HEMATOLOGY: No anemia, easy bruising or bleeding SKIN: No rash or lesion. MUSCULOSKELETAL: No joint pain or arthritis.   NEUROLOGIC: No tingling, numbness, weakness.  PSYCHIATRY: No anxiety or depression.   DRUG ALLERGIES:   Allergies  Allergen Reactions  . Celebrex [Celecoxib] Hives    VITALS:  Blood pressure 113/52, pulse 73, temperature 98 F (36.7 C), temperature source Oral, resp. rate 20, height 5\' 7"  (1.702 m), weight 93.305 kg (205 lb 11.2 oz), SpO2 96 %.  PHYSICAL EXAMINATION:  VITAL SIGNS: Filed Vitals:   09/04/15 0834 09/04/15 1102  BP: 118/52 113/52  Pulse:  73  Temp:  98 F (36.7 C)  Resp:  20   GENERAL:81 y.o.female not in acute distress. Obese. HEAD: Normocephalic, atraumatic.  EYES: Pupils equal, round, reactive to light. Extraocular muscles intact. No scleral icterus.  MOUTH: Moist mucosal membrane. Dentition intact. No abscess noted.  EAR, NOSE, THROAT: Clear without exudates. No external lesions.  NECK: Supple. No thyromegaly. No nodules. No JVD.  PULMONARY: No wheeze rails or rhonci. No use of accessory muscles, Good respiratory effort. good air entry bilaterally CHEST: Nontender to palpation.  CARDIOVASCULAR: irr, irr. No murmurs,  rubs, or gallops. 1-2+ edema on arms, trace edema on legs.  GASTROINTESTINAL: Soft, nontender, nondistended. No masses. Positive bowel sounds. No hepatosplenomegaly.  MUSCULOSKELETAL:  Range of motion decreased in all extremities.  NEUROLOGIC: Cranial nerves II through XII are intact. No gross focal neurological deficits.  SKIN: No ulceration, lesions, rashes, or cyanosis. Skin warm and dry. Turgor intact.  PSYCHIATRIC: Mood, affect blunted,  Access: s/p left brachial axillary arteriovenous graft placement.  LABORATORY PANEL:   CBC  Recent Labs Lab 09/03/15 0548  WBC 13.6*  HGB 9.7*  HCT 30.2*  PLT 354   ------------------------------------------------------------------------------------------------------------------  Chemistries   Recent Labs Lab 08/29/15 0605  09/03/15 0548  NA 138  < > 135  K 3.7  < > 4.7  CL 98*  < > 95*  CO2 22  < > 27  GLUCOSE 157*  < > 182*  BUN 123*  < > 73*  CREATININE 4.60*  < > 3.88*  CALCIUM 10.8*  < > 10.7*  MG 2.2  --   --   < > = values in this interval not displayed.   ASSESSMENT AND PLAN:   81 year old African female history of atrial fibrillation, chronic kidney disease stage V, congestive heart failure ejection fraction 60-65 presenting with worsening edema  1. Acute on chronic diastolic congestive heart failure: This is multifactorial due to worsening of renal function, anemia and low albumin. Continue hemodialysis as scheduled by nephrology, T/T/S.   2.. New onset perforated viscus: Patient has no symptoms or abdominal findings. Conservative management as per surgery. No acute indication for surgery.  3. Atrial fibrillation rapid ventricular response: Heart rates  are controlled. Continue diltiazem and metoprolol. Try to wean off amiodarone drip. Increase diltiazem to 180 mg daily.  4.End stage renal disease: s/p  tunneled hemodialysis catheter via the right internal jugular vein by Dr Lucky Cowboy on 3/6; S/p left brachial axillary  arteriovenous graft placement on 3/10. She has had 3 dialysis sessions and case management should be assisting on outpatient dialysis placement. Per Dr. Juleen China, may consider an extra treatment on Monday as she continues to have elevated BUN and uremic symptoms.   5 Hypokalemia: Improved 6. Urinary tract infection site unspecified due to klebsiella pneumoniae: Treated with keflex   7. Hypothyroidism unspecified: Continue Synthroid  8. Hypertension essential: Blood pressure control. Continue metoprolol, diltiazem   9 Venous thromboembolism prophylactic: off Eliquis considering gradual decline in Hb, continue heparin SQ 10 uremic encephalopathy: Seems to be at baseline 11 hand pain: Improved with gabapentin 12 anemia of chronic disease:EPO as per nephrology Hemoglobin stable 9.7.  13. Generalized weakness: PT consult pending for disposition 14 constipation: Patient was disimpacted by Dr Heath Lark Continue laxatives   Discussed with Dr Abigail Butts. All the records are reviewed and case discussed with Care Management/Social Workerr. Management plans discussed with the patient, family and they are in agreement.  CODE STATUS: Full code  TOTAL TIME TAKING CARE OF THIS PATIENT: 33 minutes.  Greater than 50% time was spent on coordination of care and face-to-face counseling.  POSSIBLE D/C IN 2-3 DAYS, DEPENDING ON CLINICAL CONDITION.    Demetrios Loll M.D on 09/04/2015 at 12:32 PM  Between 7am to 6pm - Pager - [phone]  After 6pm: House Pager: - [phone]  [facility]  Office  [phone]  CC: Primary care physician; Casilda Carls, MD

## 2015-09-04 NOTE — Progress Notes (Signed)
[facility]  ROUNDING NOTE   Subjective:   Hemodialysis yesterday. Tolerated treatment well. 3.5 hour treatment. UF 400.  Developed rapid ventricular response last night. Now on amiodarone gtt.   Worked with PT yesterday - recommend SNF  Objective:  Vital signs in last 24 hours:  Temp:  [97.3 F (36.3 C)-98.3 F (36.8 C)] 98.3 F (36.8 C) (03/11 1938) Pulse Rate:  [70-120] 76 (03/12 0832) Resp:  [18-20] 20 (03/12 0131) BP: (92-123)/(52-80) 118/52 mmHg (03/12 0834) SpO2:  [94 %-98 %] 98 % (03/11 2205) Weight:  [92.1 kg (203 lb 0.7 oz)-93.305 kg (205 lb 11.2 oz)] 93.305 kg (205 lb 11.2 oz) (03/12 0500)  Weight change: -6.438 kg (-14 lb 3.1 oz) Filed Weights   09/03/15 1030 09/03/15 1412 09/04/15 0500  Weight: 92.9 kg (204 lb 12.9 oz) 92.1 kg (203 lb 0.7 oz) 93.305 kg (205 lb 11.2 oz)    Intake/Output: I/O last 3 completed shifts: In: -  Out: [id number] [Urine:300; Other:417; Stool:1]   Intake/Output this shift:     Physical Exam: General: NAD, resting in bed  Head: Moist oral mucosal membranes  Eyes: Anicteric   Neck: Supple, trachea midline  Lungs:  clear  Heart: irregular  Abdomen:  Soft, nontender, BS presemt   Extremities: 2+ peripheral edema in upper extremities  Neurologic: Nonfocal, moving all four extremities. Able to follow commands  Skin: No lesions  Access:  RIJ permcath 3/6 Dr. Lucky Cowboy, left arm AVF 3/10 Dr. Delana Meyer.     Basic Metabolic Panel:  Recent Labs Lab 08/29/15 0605 08/29/15 1726 08/30/15 0752 08/31/15 0457 09/01/15 0429 09/02/15 0409 09/03/15 0548  NA 138 136 137 138 139 137 135  K 3.7 3.6 3.9 3.9 4.1 4.1 4.7  CL 98* 94* 97* 98* 98* 98* 95*  CO2 22 22 26 27 28 28 27   GLUCOSE 157* 229* 202* 202* 199* 192* 182*  BUN 123* 128* 110* 83* 65* 50* 73*  CREATININE 4.60* 4.60* 4.02* 3.39* 3.14* 2.79* 3.88*  CALCIUM 10.8* 10.5* 10.4* 10.5* 10.5* 10.4* 10.7*  MG 2.2  --   --   --   --   --   --   PHOS  --  8.0*  --   --  5.0*  --   --      Liver Function Tests:  Recent Labs Lab 08/29/15 1726  ALBUMIN 3.8   No results for input(s): LIPASE, AMYLASE in the last 168 hours. No results for input(s): AMMONIA in the last 168 hours.  CBC:  Recent Labs Lab 08/29/15 1430 08/30/15 0147 08/30/15 0752 08/31/15 0457 09/01/15 0429 09/02/15 0409 09/03/15 0548  WBC 12.0* 13.2* 10.2 12.9* 12.8* 13.9* 13.6*  NEUTROABS 11.6* 12.4*  --   --   --   --   --   HGB 7.2* 8.7* 8.9* 8.9* 8.9* 9.6* 9.7*  HCT 22.1* 26.0* 26.9* 27.5* 28.1* 29.8* 30.2*  MCV 86.6 85.0 85.8 85.7 88.7 86.7 88.8  PLT 341 334 341 361 321 340 354    Cardiac Enzymes: No results for input(s): CKTOTAL, CKMB, CKMBINDEX, TROPONINI in the last 168 hours.  BNP: Invalid input(s): POCBNP  CBG:  Recent Labs Lab 09/02/15 2104 09/03/15 0730 09/03/15 1555 09/03/15 2040 09/04/15 0737  GLUCAP 167* 200* 130* 200* 20*    Microbiology: Results for orders placed or performed during the hospital encounter of 08/22/15  MRSA PCR Screening     Status: None   Collection Time: 08/22/15  6:36 PM  Result Value Ref Range Status  MRSA by PCR NEGATIVE NEGATIVE Final    Comment:        The GeneXpert MRSA Assay (FDA approved for NASAL specimens only), is one component of a comprehensive MRSA colonization surveillance program. It is not intended to diagnose MRSA infection nor to guide or monitor treatment for MRSA infections.   Urine culture     Status: None   Collection Time: 08/24/15 11:18 AM  Result Value Ref Range Status   Specimen Description URINE, RANDOM  Final   Special Requests NONE  Final   Culture >=100,000 COLONIES/mL KLEBSIELLA PNEUMONIAE  Final   Report Status 08/27/2015 FINAL  Final   Organism ID, Bacteria KLEBSIELLA PNEUMONIAE  Final      Susceptibility   Klebsiella pneumoniae - MIC*    AMPICILLIN RESISTANT Resistant     CEFAZOLIN <=4 SENSITIVE Sensitive     CEFTRIAXONE <=1 SENSITIVE Sensitive     CIPROFLOXACIN <=0.25 SENSITIVE Sensitive      GENTAMICIN <=1 SENSITIVE Sensitive     IMIPENEM <=0.25 SENSITIVE Sensitive     NITROFURANTOIN 64 INTERMEDIATE Intermediate     TRIMETH/SULFA <=20 SENSITIVE Sensitive     AMPICILLIN/SULBACTAM <=2 SENSITIVE Sensitive     PIP/TAZO <=4 SENSITIVE Sensitive     Extended ESBL NEGATIVE Sensitive     * >=100,000 COLONIES/mL KLEBSIELLA PNEUMONIAE    Coagulation Studies: No results for input(s): LABPROT, INR in the last 72 hours.  Urinalysis: No results for input(s): COLORURINE, LABSPEC, PHURINE, GLUCOSEU, HGBUR, BILIRUBINUR, KETONESUR, PROTEINUR, UROBILINOGEN, NITRITE, LEUKOCYTESUR in the last 72 hours.  Invalid input(s): APPERANCEUR    Imaging: No results found.   Medications:   . amiodarone 30 mg/hr (09/04/15 0833)   . diltiazem  120 mg Oral Daily  . epoetin (EPOGEN/PROCRIT) injection  10,000 Units Intravenous Q T,Th,Sa-HD  . ferrous sulfate  325 mg Oral Q breakfast  . insulin aspart  0-9 Units Subcutaneous TID WC  . letrozole  2.5 mg Oral Daily  . levothyroxine  25 mcg Oral QAC breakfast  . metoprolol  150 mg Oral BID  . polyethylene glycol  17 g Oral Daily  . [START ON 09/05/2015] predniSONE  10 mg Oral Daily  . senna-docusate  2 tablet Oral BID  . sodium chloride flush  3 mL Intravenous Q12H   sodium chloride, acetaminophen **OR** acetaminophen, albuterol, hydrALAZINE, ondansetron **OR** ondansetron (ZOFRAN) IV, oxyCODONE-acetaminophen, sodium chloride flush  Assessment/ Plan:  81 y.o. female with atrial fibrillation, anemia, coronary artery disease, diabetes mellitus type 2, hypertension, hyperlipidemia, history of breast cancer, CKD stage IV/V who presents with increasing shortness of breath and edema.  1. End stage renal disease: first dialysis on 08/29/15. RIJ permcath. AVF placed 3/10 by Dr. Delana Meyer Next treatment for Tuesday. However may consider an extra treatment on Monday as she continues to have elevated BUN and uremic symptoms.  - Start discharge planning. [place] follow with [facility]  2. Hypertension and Diastolic Congestive Heart Failure with lower extremity edema and anasarca. Also with atrial fibrillation with RVR. Echocardiogram from 05/02/15.  Blood pressure at goal. - Ultrafiltration with dialysis  - Diltiazem and metoprolol  3. Anemia of chronic kidney disease: hemoglobin 9.7 - EPO with HD treatment.   4. Secondary hyperparathyroidism: with hypercalcemia: phosphorus at goal. PTH 378 holding calcitriol due to hypercalcemia.  - will look into cinacalcet as outpatient.    LOS: [place], [place] 3/12/20179:23 AM

## 2015-09-04 NOTE — Progress Notes (Signed)
81 yr old with perforated likely from diverticulum of colon. Patient continues to deny any pain in the abdomen. She has not had stool but does claim to be passing flatus. She went into A fib last night, on gtt.   Filed Vitals:   09/04/15 0834 09/04/15 1102  BP: 118/52 113/52  Pulse:  73  Temp:  98 F (36.7 C)  Resp:  20   PE:  Gen: NAD Abd: soft, distended, non-tender  Ext: 1+ edema  CBC Latest Ref Rng 09/03/2015 09/02/2015 09/01/2015  WBC 3.6 - 11.0 K/uL 13.6(H) 13.9(H) 12.8(H)  Hemoglobin 12.0 - 16.0 g/dL 9.7(L) 9.6(L) 8.9(L)  Hematocrit 35.0 - 47.0 % 30.2(L) 29.8(L) 28.1(L)  Platelets 150 - 440 K/uL 354 340 321   CMP Latest Ref Rng 09/03/2015 09/02/2015 09/01/2015  Glucose 65 - 99 mg/dL 182(H) 192(H) 199(H)  BUN 6 - 20 mg/dL 73(H) 50(H) 65(H)  Creatinine 0.44 - 1.00 mg/dL 3.88(H) 2.79(H) 3.14(H)  Sodium 135 - 145 mmol/L 135 137 139  Potassium 3.5 - 5.1 mmol/L 4.7 4.1 4.1  Chloride 101 - 111 mmol/L 95(L) 98(L) 98(L)  CO2 22 - 32 mmol/L 27 28 28   Calcium 8.9 - 10.3 mg/dL 10.7(H) 10.4(H) 10.5(H)    A/p:  81 yr old female with multiple medical issues and likely perforated diverticulitis that has walled itself off, she continues without pain.  Still with severe constipation, continue bowel regimen, may need enemas or further disimpaction

## 2015-09-05 ENCOUNTER — Ambulatory Visit: Payer: Medicare HMO | Admitting: Family

## 2015-09-05 ENCOUNTER — Encounter: Payer: Self-pay | Admitting: Vascular Surgery

## 2015-09-05 DIAGNOSIS — N179 Acute kidney failure, unspecified: Secondary | ICD-10-CM | POA: Insufficient documentation

## 2015-09-05 LAB — CBC
HCT: 29.9 % — ABNORMAL LOW (ref 35.0–47.0)
Hemoglobin: 9.4 g/dL — ABNORMAL LOW (ref 12.0–16.0)
MCH: 27.9 pg (ref 26.0–34.0)
MCHC: 31.5 g/dL — ABNORMAL LOW (ref 32.0–36.0)
MCV: 88.6 fL (ref 80.0–100.0)
PLATELETS: 264 10*3/uL (ref 150–440)
RBC: 3.38 MIL/uL — AB (ref 3.80–5.20)
RDW: 16.9 % — ABNORMAL HIGH (ref 11.5–14.5)
WBC: 11.8 10*3/uL — ABNORMAL HIGH (ref 3.6–11.0)

## 2015-09-05 LAB — GLUCOSE, CAPILLARY
GLUCOSE-CAPILLARY: 156 mg/dL — AB (ref 65–99)
Glucose-Capillary: 141 mg/dL — ABNORMAL HIGH (ref 65–99)
Glucose-Capillary: 172 mg/dL — ABNORMAL HIGH (ref 65–99)
Glucose-Capillary: 168 mg/dL — ABNORMAL HIGH (ref 65–99)

## 2015-09-05 MED ORDER — APIXABAN 5 MG PO TABS
5.0000 mg | ORAL_TABLET | Freq: Two times a day (BID) | ORAL | Status: DC
  Administered 2015-09-06 – 2015-09-08 (×4): 5 mg via ORAL
  Filled 2015-09-05 (×4): qty 1

## 2015-09-05 MED ORDER — METOPROLOL TARTRATE 25 MG PO TABS
12.5000 mg | ORAL_TABLET | Freq: Two times a day (BID) | ORAL | Status: DC
  Administered 2015-09-05: 12.5 mg via ORAL
  Filled 2015-09-05: qty 1

## 2015-09-05 MED ORDER — FLEET ENEMA 7-19 GM/118ML RE ENEM: 1.0000 | ENEMA | Freq: Every day | RECTAL | Status: DC | PRN

## 2015-09-05 MED ORDER — DILTIAZEM HCL 30 MG PO TABS
30.0000 mg | ORAL_TABLET | Freq: Three times a day (TID) | ORAL | Status: DC
  Administered 2015-09-06 – 2015-09-07 (×4): 30 mg via ORAL
  Filled 2015-09-05 (×5): qty 1

## 2015-09-05 MED ORDER — DILTIAZEM HCL ER COATED BEADS 120 MG PO CP24: 120.0000 mg | ORAL_CAPSULE | Freq: Every day | ORAL | Status: DC

## 2015-09-05 MED ORDER — AMIODARONE HCL 200 MG PO TABS
200.0000 mg | ORAL_TABLET | Freq: Every day | ORAL | Status: DC
  Administered 2015-09-05 – 2015-09-06 (×2): 200 mg via ORAL
  Filled 2015-09-05 (×3): qty 1

## 2015-09-05 NOTE — Progress Notes (Signed)
CSW spoke to [place]- Development worker, international aid at [facility] to give him an update on patient's condition. Per Marden Noble he's still able to accept patient at discharge. CSW will continue to follow and assist.  Ernest Pine, MSW, [facility] Work Department [phone]

## 2015-09-05 NOTE — Progress Notes (Signed)
Physical Therapy Treatment Patient Details Name: Teresa Lutz MRN: [id number] DOB: 06-22-1935 Today's Date: 09/05/2015    History of Present Illness Pt admitted for acute on chronic diastolic heart failure. Pt with complaints of leg swelling and tachycardia. Pt with history of ESRD and Afib. Per family report, history of recent falls.    PT Comments    Pt was mod assist for bed mobility (trunk, hip and LE assist) and 2 sets of sit to stand with RW (mod to max x2) were attempted.  However, pt could not stand.  Pt performed exercises sitting EOB.  Session was modified due to fatigue and weakness. Next session PT will attempt full standing with RW and transfer to chair as appropriate.     Follow Up Recommendations  SNF     Equipment Recommendations       Recommendations for Other Services       Precautions / Restrictions Precautions Precautions: Fall Restrictions Weight Bearing Restrictions: No    Mobility  Bed Mobility Overal bed mobility: Needs Assistance Bed Mobility: Supine to Sit;Sit to Supine     Supine to sit: Mod assist Sit to supine: Mod assist   General bed mobility comments: Required increased time, VC's and tactile cues for hand placement.  Asisst with Trunk, hips and LE for bed mobility.  Transfers Overall transfer level: Needs assistance Equipment used: Rolling walker (2 wheeled) Transfers: Sit to/from Stand (2 sets ) Sit to Stand: Max assist;Mod assist;+2 physical assistance         General transfer comment: Required extra time, difficulty using UE's appropriately during sit to stand attempts, VC's and tactile cues for hand and foot placement.   Ambulation/Gait             General Gait Details: unable at this time   Stairs            Wheelchair Mobility    Modified Rankin (Stroke Patients Only)       Balance Overall balance assessment: Needs assistance Sitting-balance support: Feet supported Sitting balance-Leahy Scale: Poor   Pt exhibited flexed trunk with posterior lean of trunk while sitting EOB.     Standing balance support: Bilateral upper extremity supported (RW) Standing balance-Leahy Scale: Poor                      Cognition Arousal/Alertness: Awake/alert Behavior During Therapy: Flat affect Overall Cognitive Status: Within Functional Limits for tasks assessed                      Exercises Total Joint Exercises Ankle Circles/Pumps: AROM;Both;20 reps (2x10) Hip ABduction/ADduction: AROM;Both;20 reps (2x10) Long Arc Quad: AROM;Both;20 reps (2x10) General Exercises - Lower Extremity Hip Flexion/Marching:  (Attempted but unable to march )  All exercises performed sitting EOB.    General Comments   Nursing was contacted and cleared pt for physical therapy.  Pt was agreeable and session modified by fatigue and weakness.        Pertinent Vitals/Pain Pain Assessment: No/denies pain  See flow sheet for vitals.     Home Living                      Prior Function            PT Goals (current goals can now be found in the care plan section) Acute Rehab PT Goals Patient Stated Goal: to get stronger PT Goal Formulation: With patient Time For Goal Achievement: 09/07/15 Potential  to Achieve Goals: Fair Additional Goals Additional Goal #1: Pt will be able to perform bed mobility/transfers with min assist in order to improve functional independence Progress towards PT goals: Progressing toward goals    Frequency  Min 2X/week    PT Plan Current plan remains appropriate    Co-evaluation             End of Session Equipment Utilized During Treatment: Gait belt Activity Tolerance: Patient limited by fatigue Patient left: with call bell/phone within reach;with bed alarm set;in bed     Time: 0950-1016 PT Time Calculation (min) (ACUTE ONLY): 26 min  Charges:                       G Codes:       Mittie Bodo, SPT Mittie Bodo 09/05/2015, 12:18  PM

## 2015-09-05 NOTE — Progress Notes (Signed)
[facility]  ROUNDING NOTE   Subjective:   Continued on amiodarone drip  No C/o SOB GI issues unresolved. Small BM reported yesterday as per nursing staff   Objective:  Vital signs in last 24 hours:  Temp:  [97.6 F (36.4 C)-98.2 F (36.8 C)] 98.2 F (36.8 C) (03/13 0402) Pulse Rate:  [60-73] 66 (03/13 0821) Resp:  [20] 20 (03/13 0402) BP: (98-113)/(46-65) 98/65 mmHg (03/13 0821) SpO2:  [96 %-100 %] 100 % (03/13 0402) Weight:  [90.674 kg (199 lb 14.4 oz)] 90.674 kg (199 lb 14.4 oz) (03/13 0402)  Weight change: -2.226 kg (-4 lb 14.5 oz) Filed Weights   09/03/15 1412 09/04/15 0500 09/05/15 0402  Weight: 92.1 kg (203 lb 0.7 oz) 93.305 kg (205 lb 11.2 oz) 90.674 kg (199 lb 14.4 oz)    Intake/Output: I/O last 3 completed shifts: In: 240 [P.O.:240] Out: 1 [Stool:1]   Intake/Output this shift:  Total I/O In: 120 [P.O.:120] Out: 0   Physical Exam: General: NAD, resting in bed  Head: Moist oral mucosal membranes  Eyes: Anicteric   Neck: Supple, trachea midline  Lungs:  clear  Heart: irregular  Abdomen:  Soft, nontender, BS presemt   Extremities: 2+ peripheral edema in upper extremities  Neurologic: Nonfocal, moving all four extremities. Able to follow commands  Skin: No lesions  Access:  RIJ permcath 3/6 Dr. Lucky Cowboy, left arm AVF 3/10 Dr. Delana Meyer.     Basic Metabolic Panel:  Recent Labs Lab 08/29/15 1726 08/30/15 0752 08/31/15 0457 09/01/15 0429 09/02/15 0409 09/03/15 0548  NA 136 137 138 139 137 135  K 3.6 3.9 3.9 4.1 4.1 4.7  CL 94* 97* 98* 98* 98* 95*  CO2 22 26 27 28 28 27   GLUCOSE 229* 202* 202* 199* 192* 182*  BUN 128* 110* 83* 65* 50* 73*  CREATININE 4.60* 4.02* 3.39* 3.14* 2.79* 3.88*  CALCIUM 10.5* 10.4* 10.5* 10.5* 10.4* 10.7*  PHOS 8.0*  --   --  5.0*  --   --     Liver Function Tests:  Recent Labs Lab 08/29/15 1726  ALBUMIN 3.8   No results for input(s): LIPASE, AMYLASE in the last 168 hours. No results for input(s):  AMMONIA in the last 168 hours.  CBC:  Recent Labs Lab 08/29/15 1430 08/30/15 0147 08/30/15 0752 08/31/15 0457 09/01/15 0429 09/02/15 0409 09/03/15 0548  WBC 12.0* 13.2* 10.2 12.9* 12.8* 13.9* 13.6*  NEUTROABS 11.6* 12.4*  --   --   --   --   --   HGB 7.2* 8.7* 8.9* 8.9* 8.9* 9.6* 9.7*  HCT 22.1* 26.0* 26.9* 27.5* 28.1* 29.8* 30.2*  MCV 86.6 85.0 85.8 85.7 88.7 86.7 88.8  PLT 341 334 341 361 321 340 354    Cardiac Enzymes: No results for input(s): CKTOTAL, CKMB, CKMBINDEX, TROPONINI in the last 168 hours.  BNP: Invalid input(s): POCBNP  CBG:  Recent Labs Lab 09/04/15 0737 09/04/15 1134 09/04/15 1700 09/04/15 2114 09/05/15 0747  GLUCAP 202* 166* 138* 164* 141*    Microbiology: Results for orders placed or performed during the hospital encounter of 08/22/15  MRSA PCR Screening     Status: None   Collection Time: 08/22/15  6:36 PM  Result Value Ref Range Status   MRSA by PCR NEGATIVE NEGATIVE Final    Comment:        The GeneXpert MRSA Assay (FDA approved for NASAL specimens only), is one component of a comprehensive MRSA colonization surveillance program. It is not intended to diagnose  MRSA infection nor to guide or monitor treatment for MRSA infections.   Urine culture     Status: None   Collection Time: 08/24/15 11:18 AM  Result Value Ref Range Status   Specimen Description URINE, RANDOM  Final   Special Requests NONE  Final   Culture >=100,000 COLONIES/mL KLEBSIELLA PNEUMONIAE  Final   Report Status 08/27/2015 FINAL  Final   Organism ID, Bacteria KLEBSIELLA PNEUMONIAE  Final      Susceptibility   Klebsiella pneumoniae - MIC*    AMPICILLIN RESISTANT Resistant     CEFAZOLIN <=4 SENSITIVE Sensitive     CEFTRIAXONE <=1 SENSITIVE Sensitive     CIPROFLOXACIN <=0.25 SENSITIVE Sensitive     GENTAMICIN <=1 SENSITIVE Sensitive     IMIPENEM <=0.25 SENSITIVE Sensitive     NITROFURANTOIN 64 INTERMEDIATE Intermediate     TRIMETH/SULFA <=20 SENSITIVE  Sensitive     AMPICILLIN/SULBACTAM <=2 SENSITIVE Sensitive     PIP/TAZO <=4 SENSITIVE Sensitive     Extended ESBL NEGATIVE Sensitive     * >=100,000 COLONIES/mL KLEBSIELLA PNEUMONIAE    Coagulation Studies: No results for input(s): LABPROT, INR in the last 72 hours.  Urinalysis: No results for input(s): COLORURINE, LABSPEC, PHURINE, GLUCOSEU, HGBUR, BILIRUBINUR, KETONESUR, PROTEINUR, UROBILINOGEN, NITRITE, LEUKOCYTESUR in the last 72 hours.  Invalid input(s): APPERANCEUR    Imaging: No results found.   Medications:   . amiodarone 30 mg/hr (09/05/15 0456)   . diltiazem  180 mg Oral Daily  . epoetin (EPOGEN/PROCRIT) injection  10,000 Units Intravenous Q T,Th,Sa-HD  . ferrous sulfate  325 mg Oral Q breakfast  . insulin aspart  0-9 Units Subcutaneous TID WC  . letrozole  2.5 mg Oral Daily  . levothyroxine  25 mcg Oral QAC breakfast  . metoprolol  150 mg Oral BID  . polyethylene glycol  17 g Oral Daily  . senna-docusate  2 tablet Oral BID  . sodium chloride flush  3 mL Intravenous Q12H   sodium chloride, acetaminophen **OR** acetaminophen, albuterol, hydrALAZINE, ondansetron **OR** ondansetron (ZOFRAN) IV, oxyCODONE-acetaminophen, sodium chloride flush  Assessment/ Plan:  81 y.o. female with atrial fibrillation, anemia, coronary artery disease, diabetes mellitus type 2, hypertension, hyperlipidemia, history of breast cancer, CKD stage IV/V who presents with increasing shortness of breath and edema.  1. End stage renal disease: first dialysis on 08/29/15. RIJ permcath. AVF placed 3/10 by Dr. Delana Meyer Next treatment for Tuesday. - discharge planning. [place] follow with [facility]  2. Hypertension and Diastolic Congestive Heart Failure with lower extremity edema and anasarca. Also with atrial fibrillation with RVR. Echocardiogram from 05/02/15.  Blood pressure at goal. - Ultrafiltration with dialysis  - Diltiazem and metoprolol  3. Anemia of chronic kidney disease:  hemoglobin 9.7 - EPO with HD treatment.   4. Secondary hyperparathyroidism: with hypercalcemia: phosphorus at goal. PTH 378 holding calcitriol due to hypercalcemia.  - will look into cinacalcet as outpatient.    LOS: [place] 3/13/201710:16 AM

## 2015-09-05 NOTE — Progress Notes (Signed)
CC: Perforated viscus Subjective: 81 year old female with multiple medical problems with a surgical consultation for perforated viscus. Patient continues to state she has no abdominal pain. She thinks she passed flatus yesterday but is unsure. Denies any nausea or vomiting.  Objective: Vital signs in last 24 hours: Temp:  [97.6 F (36.4 C)-98.2 F (36.8 C)] 98.2 F (36.8 C) (03/13 0402) Pulse Rate:  [60-74] 74 (03/13 1053) Resp:  [20] 20 (03/13 0402) BP: (95-113)/(46-65) 95/54 mmHg (03/13 1053) SpO2:  [100 %] 100 % (03/13 0402) Weight:  [90.674 kg (199 lb 14.4 oz)] 90.674 kg (199 lb 14.4 oz) (03/13 0402) Last BM Date: 09/04/15  Intake/Output from previous day: 03/12 0701 - 03/13 0700 In: 240 [P.O.:240] Out: -  Intake/Output this shift: Total I/O In: 120 [P.O.:120] Out: 0   Physical exam:  Gen.: No acute distress Abdomen: Soft, nontender, nondistended  Lab Results: CBC   Recent Labs  09/03/15 0548  WBC 13.6*  HGB 9.7*  HCT 30.2*  PLT 354   BMET  Recent Labs  09/03/15 0548  NA 135  K 4.7  CL 95*  CO2 27  GLUCOSE 182*  BUN 73*  CREATININE 3.88*  CALCIUM 10.7*   PT/INR No results for input(s): LABPROT, INR in the last 72 hours. ABG No results for input(s): PHART, HCO3 in the last 72 hours.  Invalid input(s): PCO2, PO2  Studies/Results: No results found.  Anti-infectives: Anti-infectives    Start     Dose/Rate Route Frequency Ordered Stop   09/02/15 0600  ceFAZolin (ANCEF) IVPB 2 g/50 mL premix     2 g 100 mL/hr over 30 Minutes Intravenous On call to O.R. 09/01/15 1756 09/02/15 1504   08/29/15 1300  cefUROXime (ZINACEF) 1.5 g in dextrose 5 % 50 mL IVPB     1.5 g 100 mL/hr over 30 Minutes Intravenous  Once 08/29/15 1256 08/29/15 1315   08/28/15 1000  cephALEXin (KEFLEX) 250 MG/5ML suspension 250 mg  Status:  Discontinued     250 mg Oral Every 12 hours 08/27/15 1729 08/30/15 1452   08/27/15 2200  cephALEXin (KEFLEX) 250 MG/5ML suspension 250 mg   Status:  Discontinued     250 mg Oral Every 12 hours 08/27/15 1714 08/27/15 1729   08/24/15 1430  cefTRIAXone (ROCEPHIN) 1 g in dextrose 5 % 50 mL IVPB  Status:  Discontinued     1 g 100 mL/hr over 30 Minutes Intravenous Every 24 hours 08/24/15 1409 08/27/15 1710      Assessment/Plan:  81 year old female with what appears to be completely asymptomatic and free air consistent with a perforated viscus. Given the lack of any symptoms from this plan to recheck a white blood cell count 1 more time tomorrow and if stable are improved and patient remains asymptomatic then there is no indications for any urgent surgical intervention. Surgery will continue to follow   Juanda Crumble T. Adonis Huguenin, MD, FACS  09/05/2015

## 2015-09-05 NOTE — Care Management Important Message (Signed)
Important Message  Patient Details  Name: Teresa Lutz MRN: [id number] Date of Birth: [birth year]   Medicare Important Message Given:  Yes    Juliann Pulse A Karilynn Carranza 09/05/2015, 1:17 PM

## 2015-09-05 NOTE — Progress Notes (Addendum)
[place] [facility] [place] NAME: Teresa Lutz    MR#:  [id number]  DATE OF BIRTH:  1935-05-13  SUBJECTIVE:   Better oral intake and weakness. BP is low, cardizem and lopressor were hold,  amiodarone drip was  discontinued this am. Rate controlled now.  REVIEW OF SYSTEMS:   CONSTITUTIONAL: No fever, Better oral intake, has generalized weakness EYES: No blurred or double vision.  EARS, NOSE, AND THROAT: No tinnitus or ear pain.  RESPIRATORY: No cough, shortness of breath, wheezing or hemoptysis.  CARDIOVASCULAR: No chest pain, positive orthopnea, positive edema.  GASTROINTESTINAL: No nausea, vomiting, diarrhea or abdominal pain.  GENITOURINARY: No dysuria, hematuria.  ENDOCRINE: No polyuria, nocturia,  HEMATOLOGY: No anemia, easy bruising or bleeding SKIN: No rash or lesion. MUSCULOSKELETAL: No joint pain or arthritis.   NEUROLOGIC: No tingling, numbness, weakness.  PSYCHIATRY: No anxiety or depression.   DRUG ALLERGIES:   Allergies  Allergen Reactions  . Celebrex [Celecoxib] Hives    VITALS:  Blood pressure 105/61, pulse 75, temperature 97.8 F (36.6 C), temperature source Oral, resp. rate 20, height 5\' 7"  (1.702 m), weight 90.674 kg (199 lb 14.4 oz), SpO2 98 %.  PHYSICAL EXAMINATION:  VITAL SIGNS: Filed Vitals:   09/05/15 1151 09/05/15 1154  BP: 105/61   Pulse: 137 75  Temp: 97.8 F (36.6 C)   Resp:     GENERAL:80 y.o.female not in acute distress. Obese. HEAD: Normocephalic, atraumatic.  EYES: Pupils equal, round, reactive to light. Extraocular muscles intact. No scleral icterus.  MOUTH: Moist mucosal membrane. Dentition intact. No abscess noted.  EAR, NOSE, THROAT: Clear without exudates. No external lesions.  NECK: Supple. No thyromegaly. No nodules. No JVD.  PULMONARY: No wheeze, rales or rhonci. No use of accessory muscles, Good respiratory effort. good air entry bilaterally CHEST: Nontender to palpation.   CARDIOVASCULAR: irr, irr. No murmurs, rubs, or gallops. 1+ edema on arms, trace edema on legs.  GASTROINTESTINAL: Soft, nontender, nondistended. No masses. Positive bowel sounds. No hepatosplenomegaly.  MUSCULOSKELETAL:  Range of motion decreased in all extremities.  NEUROLOGIC: Cranial nerves II through XII are intact. No gross focal neurological deficits.  SKIN: No ulceration, lesions, rashes, or cyanosis. Skin warm and dry. Turgor intact.  PSYCHIATRIC: alert, awake and oriented. Access: s/p left brachial axillary arteriovenous graft placement.  LABORATORY PANEL:   CBC  Recent Labs Lab 09/05/15 1129  WBC 11.8*  HGB 9.4*  HCT 29.9*  PLT 264   ------------------------------------------------------------------------------------------------------------------  Chemistries   Recent Labs Lab 09/03/15 0548  NA 135  K 4.7  CL 95*  CO2 27  GLUCOSE 182*  BUN 73*  CREATININE 3.88*  CALCIUM 10.7*     ASSESSMENT AND PLAN:   81 year old African female history of atrial fibrillation, chronic kidney disease stage V, congestive heart failure ejection fraction 60-65 presenting with worsening edema  1. Acute on chronic diastolic congestive heart failure: This is multifactorial due to worsening of renal function, anemia and low albumin. Continue hemodialysis as scheduled by nephrology, T/T/S.   2.. New onset perforated viscus: Patient has no symptoms or abdominal findings. Conservative management as per surgery. No acute indication for surgery.  3. Atrial fibrillation rapid ventricular response: Heart rates are controlled with amiodarone drip. Changed to po amiodarone po this am, hold diltiazem and metoprolol this am. May discontinue amiodarone, keep diltiazem and metoprolol and resume eliquis per cardiologist.  Continue diltiazem and metoprolol if BP allows (decreased doses).   4.End stage renal disease: s/p  tunneled hemodialysis catheter via the right internal jugular vein by  Dr Lucky Cowboy on 3/6; S/p left brachial axillary arteriovenous graft placement on 3/10. She has had 3 dialysis sessions and case management should be assisting on outpatient dialysis placement.  5 Hypokalemia: Improved. 6. Urinary tract infection site unspecified due to klebsiella pneumoniae: Treated with keflex   7. Hypothyroidism unspecified: Continue Synthroid  8. Hypertension essential: Blood pressure control. Continue metoprolol, diltiazem   9 Venous thromboembolism prophylactic: off Eliquis considering gradual decline in Hb, continue heparin SQ. Consider resume eliquis per cardiologist.  10 uremic encephalopathy: Seems to be at baseline 11 hand pain: Improved with gabapentin 12 anemia of chronic disease:EPO as per nephrology Hemoglobin stable 9.7.  13. Generalized weakness: continue PT. 14 constipation: Patient was disimpacted by Dr Heath Lark Continue laxatives, enema prn.  All the records are reviewed and case discussed with Care Management/Social Workerr. Management plans discussed with the patient, family and they are in agreement.  CODE STATUS: Full code  TOTAL TIME TAKING CARE OF THIS PATIENT: 36 minutes.  Greater than 50% time was spent on coordination of care and face-to-face counseling.  POSSIBLE D/C IN 2 DAYS, DEPENDING ON CLINICAL CONDITION.    Demetrios Loll M.D on 09/05/2015 at 1:42 PM  Between 7am to 6pm - Pager - [phone]  After 6pm: House Pager: - [phone]  [facility]  Office  [phone]  CC: Primary care physician; Casilda Carls, MD

## 2015-09-05 NOTE — Progress Notes (Signed)
Notified Dr. Bridgett Larsson of current BPs.   Filed Vitals:   09/05/15 0821 09/05/15 1053  BP: 98/65 95/54  Pulse: 66 74  Temp:    Resp:     Per Dr. Bridgett Larsson stop amiodarone drip due to patient's low BP. Will continue to monitor patient. Horton Finer

## 2015-09-05 NOTE — Progress Notes (Signed)
Patient Name: Teresa Lutz Date of Encounter: 09/05/2015  Hospital Problem List     Principal Problem:   Acute on chronic diastolic (congestive) heart failure (HCC) Active Problems:   End stage renal disease (HCC)   Free intraperitoneal air   Perforated diverticulum of large intestine   Chronic atrial fibrillation (HCC)   Atrial fibrillation with RVR (HCC)   Generalized edema   Hypoalbuminemia due to protein-calorie malnutrition (HCC)   Hypertensive heart disease   Atrial fibrillation with rapid ventricular response (HCC)   Hyperlipidemia   Anemia of chronic disease   Pressure ulcer    Subjective   No chest pain, sob, or abd pain.  She has been tolerating dialysis.  Chronic AFib - currently rate controlled and asymptomatic.  She was placed on amio gtt in the early morning hours of 1/12 due to elevated rates into the 140's.  This was d/c'd this AM secondary to relative hypotension.    Inpatient Medications    . amiodarone  200 mg Oral Daily  . [START ON 09/06/2015] diltiazem  120 mg Oral Daily  . epoetin (EPOGEN/PROCRIT) injection  10,000 Units Intravenous Q T,Th,Sa-HD  . ferrous sulfate  325 mg Oral Q breakfast  . insulin aspart  0-9 Units Subcutaneous TID WC  . letrozole  2.5 mg Oral Daily  . levothyroxine  25 mcg Oral QAC breakfast  . metoprolol  12.5 mg Oral BID  . polyethylene glycol  17 g Oral Daily  . senna-docusate  2 tablet Oral BID  . sodium chloride flush  3 mL Intravenous Q12H    Vital Signs    Filed Vitals:   09/05/15 0955 09/05/15 1053 09/05/15 1151 09/05/15 1154  BP:  95/54 105/61   Pulse: 86 74 137 75  Temp:   97.8 F (36.6 C)   TempSrc:   Oral   Resp:      Height:      Weight:      SpO2: 94%  98%     Intake/Output Summary (Last 24 hours) at 09/05/15 1231 Last data filed at 09/05/15 1205  Gross per 24 hour  Intake    360 ml  Output      0 ml  Net    360 ml   Filed Weights   09/03/15 1412 09/04/15 0500 09/05/15 0402  Weight: 203 lb  0.7 oz (92.1 kg) 205 lb 11.2 oz (93.305 kg) 199 lb 14.4 oz (90.674 kg)    Physical Exam    General: Pleasant, NAD. Neuro: Alert and oriented X 3. Moves all extremities spontaneously. Psych: Normal affect. HEENT:  Normal  Neck: Supple without bruits or JVD. Lungs:  Resp regular and unlabored, diminished breath sounds @ bases. Heart: IR, IR no s3, s4, or murmurs. Abdomen: Soft, non-tender, non-distended, BS + x 4.  Extremities: No clubbing, cyanosis.  Trace bilat LE edema. DP/PT/Radials 1+ and equal bilaterally.  Labs    CBC  Recent Labs  09/03/15 0548 09/05/15 1129  WBC 13.6* 11.8*  HGB 9.7* 9.4*  HCT 30.2* 29.9*  MCV 88.8 88.6  PLT 354 [id number]   Basic Metabolic Panel  Recent Labs  09/03/15 0548  NA 135  K 4.7  CL 95*  CO2 27  GLUCOSE 182*  BUN 73*  CREATININE 3.88*  CALCIUM 10.7*    Telemetry    Afib - mostly 70's to 80's.  Radiology    Ct Abdomen Pelvis Wo Contrast  09/01/2015  CLINICAL DATA:  Free intraperitoneal air on KUB.  No  abdominal pain EXAM: CT ABDOMEN AND PELVIS WITHOUT CONTRAST TECHNIQUE: Multidetector CT imaging of the abdomen and pelvis was performed following the standard protocol without IV contrast. COMPARISON:  09/01/2015 FINDINGS: Lower chest: Elevated right hemidiaphragm with right lower lobe atelectasis. No pleural effusion. Left lung clear. Mild cardiac enlargement. Coronary calcification. Hemodialysis catheter at the cavoatrial junction. Hepatobiliary: Liver normal in size and contour without focal lesion. Gallbladder and bile ducts normal. Pancreas: No pancreatic mass or edema. There is free air in the peritoneal cavity as well as around the pancreas and in the mesentery. There is gas just above the pancreatic head. No definite duodenal edema to suggest perforated ulcer however this is in the differential. Spleen: Negative Adrenals/Urinary Tract: Multiple small nonobstructing bilateral renal calculi. 3.7 cm cyst left kidney posteriorly. Small cyst  right kidney. No renal mass. Bilateral adrenal enlargement likely due to adrenal adenomata. Left adrenal nodule measures 19 x 29 mm. Urinary bladder normal. Stomach/Bowel: No oral contrast or IV contrast administered. The stomach is nondilated. There is no bowel edema although oral contrast would be helpful in evaluation of the bowel given the amount of free air. There is gas in the retroperitoneum near the pancreas as well as in the mesentery and in the peritoneal cavity. No bowel obstruction. Extensive pan diverticulosis of the colon. No evidence of diverticulitis. There is a large amount of stool in the colon including the rectum. There is a large amount of gas in the left mesentery medial to the left colon. This may be the area perforation, possibly due to perforated diverticulum. Vascular/Lymphatic: Negative for aortic aneurysm. No lymphadenopathy. Reproductive: Calcified uterine fibroid on the left. No pelvic mass. Other: No free fluid.  No abscess. Musculoskeletal: Mild compression fracture L1 of indeterminate age. Disc degeneration and spurring in the lumbar spine. Mild to moderate joint space narrowing in both hip joints. IMPRESSION: Pneumoperitoneum. Moderate amount of free air. There is also gas in the retroperitoneum around the pancreas and gas in the mesentery. Pneumoperitoneum may be related to perforated diverticulosis of the left colon. No evidence of duodenum edema to suggest perforated ulcer disease. Surgical consultation recommended. Extensive stool throughout the colon. Critical Value/emergent results were called by telephone at the time of interpretation on 09/01/2015 at 6:55 pm to Dr. Vianne Bulls , who verbally acknowledged these results. Electronically Signed   By: Franchot Gallo M.D.   On: 09/01/2015 18:55   Dg Abd 1 View  09/01/2015  CLINICAL DATA:  Constipation, abdominal pain EXAM: ABDOMEN - 1 VIEW COMPARISON:  None. FINDINGS: There appears to be free intraperitoneal air within the abdomen. I  also suspect pneumatosis intestinalis of the left colon. Bowel gas pattern is nonobstructive. Large amount of stool is seen throughout the nondistended colon compatible with the given history of constipation. No soft tissue mass or abnormal fluid collection seen. Lung bases are clear. IMPRESSION: Findings highly suggestive of free intraperitoneal air within the abdomen and/or free retroperitoneal air. In the absence of a recent surgery, this almost certainly indicates bowel perforation. I also suspect pneumatosis intestinalis of the left colon which raises the possibility of associated bowel ischemia. Critical Value/emergent results were called by telephone at the time of interpretation on 09/01/2015 at 4:30 pm to Dr. Ulice Bold MODY , who verbally acknowledged these results. Electronically Signed   By: Franki Cabot M.D.   On: 09/01/2015 16:35   US Renal  08/23/2015  CLINICAL DATA:  Acute renal failure. EXAM: RENAL / URINARY TRACT ULTRASOUND COMPLETE COMPARISON:  None. FINDINGS: Right  Kidney: Length: 10.3 cm. Mild increased cortical echogenicity. No hydronephrosis. 1.8 cm cyst over the lower pole. Left Kidney: Length: 10.5 cm. Increased cortical echogenicity. No hydronephrosis. 4 cm cyst over the mid pole. Bladder: Appears normal for degree of bladder distention. IMPRESSION: Normal size kidneys without hydronephrosis. Mild increased cortical echogenicity compatible medical renal disease. Single bilateral renal cysts as described. Electronically Signed   By: Marin Olp M.D.   On: 08/23/2015 12:24   Dg Chest Port 1 View  08/22/2015  CLINICAL DATA:  BILATERAL lower extremity swelling since this weekend, known CHF, dyspnea, breast cancer, chronic atrial fibrillation, pulmonary hypertension EXAM: PORTABLE CHEST 1 VIEW COMPARISON:  Portable exam 1533 hours compared to 08/01/2015 FINDINGS: Minimal enlargement of cardiac silhouette. Tortuosity of thoracic aorta. Pulmonary vascularity normal. Chronic RIGHT basilar  atelectasis. Lungs otherwise clear. No pleural effusion or pneumothorax. Bones demineralized with degenerative disc disease changes thoracic spine and BILATERAL glenohumeral degenerative changes. IMPRESSION: Minimal enlargement of cardiac silhouette with chronic RIGHT basilar atelectasis. Electronically Signed   By: Lavonia Dana M.D.   On: 08/22/2015 15:50    Assessment & Plan    1. Acute on Chronic Diastolic CHF Presented with worsening lower extremity edema and mild orthopnea and dyspnea.  After progressive renal failure despite diuretics, HD initiated 3/6. -Volume mgmt per nephrology/HD>  2. Chronic atrial fibrillation with variable rate control/RVR She has a h/o chronic, rate controlled AFib.  She presented in atrial fibrillation with RVR, HR in the 140's. Likely secondary to her significant volume overload. *Rates have been mostly controlled on  blocker and dilt throughout admission however, she had recurrence of rapid rates late on 3/11 and early 3/12.  Notably, she had not received her AM doses of dilt or metoprolol that day and they were not given until after 9PM on 3/11.  IV amio was added in the early morning hours of 3/12.  This was d/c'd 2/2 relative hypotension this morning and both dilt and metoprolol were held. -Ideally, we should be able to rate control her without using amiodarone, as she was able to be rate controlled in the past on dilt 120 daily and metoprolol 150 bid. -Since initiation of HD, her pressures have been trending slightly lower. -Amio switched to 200 mg PO daily this AM. -Will likely have to reduce dilt and  blocker dose to allow for more BP, so that she is able to receive them.  Will have to hold doses on dialysis mornings (planned for TTS). Once she can reliably take dilt/ blocker w/o hypotn, can probably d/c' amio.  - This patients CHA2DS2-VASc Score and unadjusted Ischemic Stroke Rate (% per year) is equal to 7.2 % stroke rate/year from a score of 5 (CHF, HTN,  Female, Age (2)). Eliquis currently has been on hold 2/2 anemia, though anemia has been stable.  -If no further concerns re: active bleeding, would resume (no dose adjustment required as wt > 60kg and age <63).  Defer to surgery/renal.  3. ESRD Now on HD - initiated 3/6.   S/P AVG placement 3/10 -Per nephrology.  4. HTN BP's have been soft.  blocker dose reduced.  5. Perforated Viscus/Abdominal Free Air Seen by surgery - conservative therapy as she is asymptomatic.    6. HLD LDL 65 this admission. Not on statin.  7. OSA  On CPAP  8. Anemia of Chronic Kidney Disease  Stable.  9. Elevated Troponin - troponin 0.08 on admission. Likely secondary to demand ischemia in the setting of CHF/AF RVR.  Renal failure may also be playing a role. - Low risk MV in 03/2015 (no ischemia). -No plan for further ischemic eval @ this time.  Signed, Murray Hodgkins NP    Attending Note Patient seen and examined, agree with detailed note above,  Patient presentation and plan discussed on rounds.   Heart rate relatively well controlled though low blood pressure Medications held this morning including diltiazem and metoprolol Doses were given yesterday Amiodarone infusion change to oral dosing, 200 mg daily She is relatively asymptomatic, clinical exam stable, lungs clear, trace leg edema, heart rate in the 70s currently atrial fibrillation  Recommend diltiazem 30 mg 3 times a day with hold parameters, metoprolol 25 mg twice a day with hold parameters.  Would likely be able to wean amiodarone oral dose once medications above restarted  Could potentially restart Eliquis 5 mg po BID until use of AV fistula as needed  Greater than 50% was spent in counseling and coordination of care with patient Total encounter time 35 minutes or more   Signed: Esmond Plants  M.D., Ph.D. [facility]

## 2015-09-05 NOTE — Care Management (Signed)
Barriers to discharge- required amiodarone drip 3/12 but has now been discontinued and transitioned to oral amiodarone.  There is concern for perforated viscous.  Continues with HD

## 2015-09-06 ENCOUNTER — Inpatient Hospital Stay: Payer: Medicare HMO

## 2015-09-06 ENCOUNTER — Inpatient Hospital Stay: Payer: Medicare HMO | Admitting: Oncology

## 2015-09-06 DIAGNOSIS — I5032 Chronic diastolic (congestive) heart failure: Secondary | ICD-10-CM

## 2015-09-06 LAB — GLUCOSE, CAPILLARY
GLUCOSE-CAPILLARY: 80 mg/dL (ref 65–99)
GLUCOSE-CAPILLARY: 83 mg/dL (ref 65–99)
Glucose-Capillary: 138 mg/dL — ABNORMAL HIGH (ref 65–99)

## 2015-09-06 MED ORDER — METOPROLOL TARTRATE 25 MG PO TABS
25.0000 mg | ORAL_TABLET | Freq: Two times a day (BID) | ORAL | Status: DC
  Administered 2015-09-06 – 2015-09-07 (×2): 25 mg via ORAL
  Filled 2015-09-06 (×2): qty 1

## 2015-09-06 MED ORDER — AMIODARONE HCL 200 MG PO TABS: 200.0000 mg | ORAL_TABLET | Freq: Once | ORAL | Status: DC

## 2015-09-06 MED ORDER — AMIODARONE HCL 200 MG PO TABS
200.0000 mg | ORAL_TABLET | Freq: Once | ORAL | Status: AC
  Administered 2015-09-06: 200 mg via ORAL

## 2015-09-06 MED ORDER — AMIODARONE HCL 200 MG PO TABS
200.0000 mg | ORAL_TABLET | Freq: Two times a day (BID) | ORAL | Status: DC
  Administered 2015-09-06 – 2015-09-08 (×4): 200 mg via ORAL
  Filled 2015-09-06 (×4): qty 1

## 2015-09-06 NOTE — Progress Notes (Signed)
[place] at [place] NAME: Teresa Lutz    MR#:  [id number]  DATE OF BIRTH:  01-03-1935  SUBJECTIVE:   Better oral intake and weakness. Afib RVR at 124.   REVIEW OF SYSTEMS:   CONSTITUTIONAL: No fever, Better oral intake, has generalized weakness EYES: No blurred or double vision.  EARS, NOSE, AND THROAT: No tinnitus or ear pain.  RESPIRATORY: No cough, shortness of breath, wheezing or hemoptysis.  CARDIOVASCULAR: No chest pain, positive orthopnea, positive edema.  GASTROINTESTINAL: No nausea, vomiting, diarrhea or abdominal pain.  GENITOURINARY: No dysuria, hematuria.  ENDOCRINE: No polyuria, nocturia,  HEMATOLOGY: No anemia, easy bruising or bleeding SKIN: No rash or lesion. MUSCULOSKELETAL: No joint pain or arthritis.   NEUROLOGIC: No tingling, numbness, weakness.  PSYCHIATRY: No anxiety or depression.   DRUG ALLERGIES:   Allergies  Allergen Reactions  . Celebrex [Celecoxib] Hives    VITALS:  Blood pressure 107/62, pulse 106, temperature 97.8 F (36.6 C), temperature source Oral, resp. rate 16, height 5\' 7"  (1.702 m), weight 93.849 kg (206 lb 14.4 oz), SpO2 97 %.  PHYSICAL EXAMINATION:  VITAL SIGNS: Filed Vitals:   09/06/15 0539 09/06/15 1000  BP: 121/66 107/62  Pulse: 82 106  Temp: 97.8 F (36.6 C)   Resp: 54    GENERAL:81 y.o.female not in acute distress. Obese. HEAD: Normocephalic, atraumatic.  EYES: Pupils equal, round, reactive to light. Extraocular muscles intact. No scleral icterus.  MOUTH: Moist mucosal membrane. Dentition intact. No abscess noted.  EAR, NOSE, THROAT: Clear without exudates. No external lesions.  NECK: Supple. No thyromegaly. No nodules. No JVD.  PULMONARY: No wheeze, rales or rhonci. No use of accessory muscles, Good respiratory effort. good air entry bilaterally CHEST: Nontender to palpation.  CARDIOVASCULAR: irr, irr. Tachycardia. No murmurs, rubs, or gallops. 1+ edema on arms,  trace edema on legs.  GASTROINTESTINAL: Soft, nontender, nondistended. No masses. Positive bowel sounds. No hepatosplenomegaly.  MUSCULOSKELETAL:  Range of motion decreased in all extremities.  NEUROLOGIC: Cranial nerves II through XII are intact. No gross focal neurological deficits.  SKIN: No ulceration, lesions, rashes, or cyanosis. Skin warm and dry. Turgor intact.  PSYCHIATRIC: alert, awake and oriented. Access: s/p left brachial axillary arteriovenous graft placement.  LABORATORY PANEL:   CBC  Recent Labs Lab 09/05/15 1129  WBC 11.8*  HGB 9.4*  HCT 29.9*  PLT 264   ------------------------------------------------------------------------------------------------------------------  Chemistries   Recent Labs Lab 09/03/15 0548  NA 135  K 4.7  CL 95*  CO2 27  GLUCOSE 182*  BUN 73*  CREATININE 3.88*  CALCIUM 10.7*     ASSESSMENT AND PLAN:   81 year old African female history of atrial fibrillation, chronic kidney disease stage V, congestive heart failure ejection fraction 60-65 presenting with worsening edema  1. Acute on chronic diastolic congestive heart failure: This is multifactorial due to worsening of renal function, anemia and low albumin. Continue hemodialysis as scheduled by nephrology, T/T/S.   2.. New onset perforated viscus: Patient has no symptoms or abdominal findings. Conservative management as per surgery. No acute indication for surgery.  3. Atrial fibrillation rapid ventricular response: Heart rates are not controlled. She was on amiodarone drip and Changed to po amiodarone po. Per Dr. Rockey Situ, increase amiodarone up to 200 mg twice a day for rate control, continue Cardizem 30mg  TID and Lopressor 25mg  BID at current rate. (Will have to hold doses on dialysis mornings (planned for TTS). Once she can reliably take dilt/? blocker w/o hypotension,  can probably discontinue Amiodarone.   4.End stage renal disease: s/p  tunneled hemodialysis catheter via  the right internal jugular vein by Dr Lucky Cowboy on 3/6; S/p left brachial axillary arteriovenous graft placement on 3/10. She has had 3 dialysis sessions and case management should be assisting on outpatient dialysis placement. She got outpatient dialysis chair, but she is unable to sit to get HD.  5 Hypokalemia: Improved. 6. Urinary tract infection site unspecified due to klebsiella pneumoniae: Treated with keflex   7. Hypothyroidism unspecified: Continue Synthroid  8. Hypertension essential: Blood pressure control. Continue metoprolol, diltiazem   9 Venous thromboembolism prophylactic: off Eliquis considering gradual decline in Hb. Hb has been stable, resume eliquis today. 10 uremic encephalopathy: Seems to be at baseline 11 hand pain: Improved with gabapentin 12 anemia of chronic disease:EPO as per nephrology Hemoglobin stable 9.7.  13. Generalized weakness: continue PT. 14 constipation: Patient was disimpacted by Dr Heath Lark Continue laxatives, enema prn.  All the records are reviewed and case discussed with Care Management/Social Workerr. Management plans discussed with the patient, family and they are in agreement.  CODE STATUS: Full code  TOTAL TIME TAKING CARE OF THIS PATIENT: 33 minutes.  Greater than 50% time was spent on coordination of care and face-to-face counseling.  POSSIBLE D/C IN 2 DAYS, DEPENDING ON CLINICAL CONDITION.    Demetrios Loll M.D on 09/06/2015 at 12:05 PM  Between 7am to 6pm - Pager - [phone]  After 6pm: House Pager: - [phone]  [facility]  Office  [phone]  CC: Primary care physician; Casilda Carls, MD

## 2015-09-06 NOTE — Progress Notes (Signed)
[facility]  ROUNDING NOTE   Subjective:   Feels a little better today Denies acute c./o Appetite remains poor   Objective:  Vital signs in last 24 hours:  Temp:  [97.8 F (36.6 C)-98.3 F (36.8 C)] 97.8 F (36.6 C) (03/14 0539) Pulse Rate:  [75-137] 106 (03/14 1000) Resp:  [16] 16 (03/14 0539) BP: (105-152)/(57-122) 107/62 mmHg (03/14 1000) SpO2:  [97 %-100 %] 97 % (03/14 0539) Weight:  [93.849 kg (206 lb 14.4 oz)] 93.849 kg (206 lb 14.4 oz) (03/14 0539)  Weight change: 3.175 kg (7 lb) Filed Weights   09/04/15 0500 09/05/15 0402 09/06/15 0539  Weight: 93.305 kg (205 lb 11.2 oz) 90.674 kg (199 lb 14.4 oz) 93.849 kg (206 lb 14.4 oz)    Intake/Output: I/O last 3 completed shifts: In: 240 [P.O.:240] Out: 0    Intake/Output this shift:  Total I/O In: 120 [P.O.:120] Out: -   Physical Exam: General: NAD, resting in bed  Head: Moist oral mucosal membranes  Eyes: Anicteric   Neck: Supple, trachea midline  Lungs:  clear  Heart: irregular  Abdomen:  Soft, nontender, BS presemt   Extremities: 2+ peripheral edema in upper extremities  Neurologic: Nonfocal, moving all four extremities. Able to follow commands  Skin: No lesions  Access:  RIJ permcath 3/6 Dr. Lucky Cowboy, left arm AVF 3/10 Dr. Delana Meyer.     Basic Metabolic Panel:  Recent Labs Lab 08/31/15 0457 09/01/15 0429 09/02/15 0409 09/03/15 0548  NA 138 139 137 135  K 3.9 4.1 4.1 4.7  CL 98* 98* 98* 95*  CO2 27 28 28 27   GLUCOSE 202* 199* 192* 182*  BUN 83* 65* 50* 73*  CREATININE 3.39* 3.14* 2.79* 3.88*  CALCIUM 10.5* 10.5* 10.4* 10.7*  PHOS  --  5.0*  --   --     Liver Function Tests: No results for input(s): AST, ALT, ALKPHOS, BILITOT, PROT, ALBUMIN in the last 168 hours. No results for input(s): LIPASE, AMYLASE in the last 168 hours. No results for input(s): AMMONIA in the last 168 hours.  CBC:  Recent Labs Lab 08/31/15 0457 09/01/15 0429 09/02/15 0409 09/03/15 0548 09/05/15 1129   WBC 12.9* 12.8* 13.9* 13.6* 11.8*  HGB 8.9* 8.9* 9.6* 9.7* 9.4*  HCT 27.5* 28.1* 29.8* 30.2* 29.9*  MCV 85.7 88.7 86.7 88.8 88.6  PLT 361 321 340 354 264    Cardiac Enzymes: No results for input(s): CKTOTAL, CKMB, CKMBINDEX, TROPONINI in the last 168 hours.  BNP: Invalid input(s): POCBNP  CBG:  Recent Labs Lab 09/05/15 0747 09/05/15 1149 09/05/15 1631 09/05/15 2047 09/06/15 0726  GLUCAP 141* 156* 172* 168* 138*    Microbiology: Results for orders placed or performed during the hospital encounter of 08/22/15  MRSA PCR Screening     Status: None   Collection Time: 08/22/15  6:36 PM  Result Value Ref Range Status   MRSA by PCR NEGATIVE NEGATIVE Final    Comment:        The GeneXpert MRSA Assay (FDA approved for NASAL specimens only), is one component of a comprehensive MRSA colonization surveillance program. It is not intended to diagnose MRSA infection nor to guide or monitor treatment for MRSA infections.   Urine culture     Status: None   Collection Time: 08/24/15 11:18 AM  Result Value Ref Range Status   Specimen Description URINE, RANDOM  Final   Special Requests NONE  Final   Culture >=100,000 COLONIES/mL KLEBSIELLA PNEUMONIAE  Final   Report Status 08/27/2015  FINAL  Final   Organism ID, Bacteria KLEBSIELLA PNEUMONIAE  Final      Susceptibility   Klebsiella pneumoniae - MIC*    AMPICILLIN RESISTANT Resistant     CEFAZOLIN <=4 SENSITIVE Sensitive     CEFTRIAXONE <=1 SENSITIVE Sensitive     CIPROFLOXACIN <=0.25 SENSITIVE Sensitive     GENTAMICIN <=1 SENSITIVE Sensitive     IMIPENEM <=0.25 SENSITIVE Sensitive     NITROFURANTOIN 64 INTERMEDIATE Intermediate     TRIMETH/SULFA <=20 SENSITIVE Sensitive     AMPICILLIN/SULBACTAM <=2 SENSITIVE Sensitive     PIP/TAZO <=4 SENSITIVE Sensitive     Extended ESBL NEGATIVE Sensitive     * >=100,000 COLONIES/mL KLEBSIELLA PNEUMONIAE    Coagulation Studies: No results for input(s): LABPROT, INR in the last 72  hours.  Urinalysis: No results for input(s): COLORURINE, LABSPEC, PHURINE, GLUCOSEU, HGBUR, BILIRUBINUR, KETONESUR, PROTEINUR, UROBILINOGEN, NITRITE, LEUKOCYTESUR in the last 72 hours.  Invalid input(s): APPERANCEUR    Imaging: No results found.   Medications:     . amiodarone  200 mg Oral Daily  . amiodarone  200 mg Oral BID  . apixaban  5 mg Oral BID  . diltiazem  30 mg Oral TID  . epoetin (EPOGEN/PROCRIT) injection  10,000 Units Intravenous Q T,Th,Sa-HD  . ferrous sulfate  325 mg Oral Q breakfast  . insulin aspart  0-9 Units Subcutaneous TID WC  . letrozole  2.5 mg Oral Daily  . levothyroxine  25 mcg Oral QAC breakfast  . metoprolol  25 mg Oral BID  . polyethylene glycol  17 g Oral Daily  . senna-docusate  2 tablet Oral BID  . sodium chloride flush  3 mL Intravenous Q12H   sodium chloride, acetaminophen **OR** acetaminophen, albuterol, hydrALAZINE, ondansetron **OR** ondansetron (ZOFRAN) IV, oxyCODONE-acetaminophen, sodium chloride flush, sodium phosphate  Assessment/ Plan:  81 y.o. female with atrial fibrillation, anemia, coronary artery disease, diabetes mellitus type 2, hypertension, hyperlipidemia, history of breast cancer, CKD stage IV/V who presents with increasing shortness of breath and edema.  1. End stage renal disease: first dialysis on 08/29/15. RIJ permcath. AVF placed 3/10 by Dr. Delana Meyer Next treatment for Today. - discharge planning. [place] follow with [facility]  2. Hypertension and Diastolic Congestive Heart Failure with lower extremity edema and anasarca. Also with atrial fibrillation with RVR. Echocardiogram from 05/02/15.  Blood pressure at goal. - Ultrafiltration with dialysis  - Diltiazem and metoprolol  3. Anemia of chronic kidney disease: hemoglobin 9.7 - EPO with HD treatment.   4. Secondary hyperparathyroidism: with hypercalcemia: phosphorus at goal. PTH 378 holding calcitriol due to hypercalcemia.  - will look into cinacalcet as  outpatient.    LOS: 15 Teresa Lutz 3/14/201711:28 AM

## 2015-09-06 NOTE — Progress Notes (Signed)
Patient rested quietly tonight with no complaints of pain. A&Ox4, VSS, and a-fib on tele. HR occasionally jumps into the 130s-140s, but is not sustained. Nursing staff will continue to monitor. Earleen Reaper, RN

## 2015-09-06 NOTE — Progress Notes (Signed)
Hospital Problem List     Principal Problem:   Acute on chronic diastolic (congestive) heart failure (HCC) Active Problems:   Hyperlipidemia   Chronic atrial fibrillation (HCC)   Anemia of chronic disease   Atrial fibrillation with RVR (HCC)   Pressure ulcer   End stage renal disease (HCC)   Generalized edema   Hypoalbuminemia due to protein-calorie malnutrition (HCC)   Hypertensive heart disease   Atrial fibrillation with rapid ventricular response ([place])   Free intraperitoneal air   Perforated diverticulum of large intestine   Acute kidney failure, unspecified ([place])    Patient Profile:   Primary Cardiologist: Dr. Fletcher Anon  82 y.o. female w/ PMH of chronic atrial fibrillation (on Eliquis), chronic diastolic CHF, Stage 4 CKD, Pulmonary HTN, HLD, OSA (on CPAP), Type 2 DM and HTN admitted on 08/22/2015 for acute on chronic diastolic CHF. Started on HD 08/29/2015  Subjective   Reports "feeling the same". Denies any chest pain or shortness of breath. Remains in atrial fibrillation, rates slightly elevated this AM with HR in the  90's - low 100's but morning medications held until after HD to avoid hypotension.  Inpatient Medications    . amiodarone  200 mg Oral Daily  . amiodarone  200 mg Oral BID  . amiodarone  200 mg Oral Once  . apixaban  5 mg Oral BID  . diltiazem  30 mg Oral TID  . epoetin (EPOGEN/PROCRIT) injection  10,000 Units Intravenous Q T,Th,Sa-HD  . ferrous sulfate  325 mg Oral Q breakfast  . insulin aspart  0-9 Units Subcutaneous TID WC  . letrozole  2.5 mg Oral Daily  . levothyroxine  25 mcg Oral QAC breakfast  . metoprolol  25 mg Oral BID  . polyethylene glycol  17 g Oral Daily  . senna-docusate  2 tablet Oral BID  . sodium chloride flush  3 mL Intravenous Q12H    Vital Signs    Filed Vitals:   09/05/15 1154 09/05/15 1953 09/05/15 1954 09/06/15 0539  BP:  152/122 113/57 121/66  Pulse: 75 94 101 82  Temp:  98.3 F (36.8 C)  97.8 F (36.6 C)  TempSrc:   Oral    Resp:  16  16  Height:      Weight:    206 lb 14.4 oz (93.849 kg)  SpO2:  99% 100% 97%    Intake/Output Summary (Last 24 hours) at 09/06/15 1027 Last data filed at 09/06/15 1003  Gross per 24 hour  Intake    240 ml  Output      0 ml  Net    240 ml   Filed Weights   09/04/15 0500 09/05/15 0402 09/06/15 0539  Weight: 205 lb 11.2 oz (93.305 kg) 199 lb 14.4 oz (90.674 kg) 206 lb 14.4 oz (93.849 kg)    Physical Exam    General: Well developed, well nourished, female appearing in no acute distress. Head: Normocephalic, atraumatic.  Neck: Supple without bruits, JVD not elevated. Lungs:  Resp regular and unlabored, decreased breath sounds at the bases bilaterally. No wheezing or rales appreciated. Heart: Irregularly irregular, S1, S2, no S3, S4, or murmur; no rub. Abdomen: Soft, non-tender, non-distended with normoactive bowel sounds. No hepatomegaly. No rebound/guarding. No obvious abdominal masses. Extremities: No clubbing or cyanosis, trace edema up to mid-shins. Distal pedal pulses are 2+ bilaterally. Neuro: Alert and oriented X 3. Moves all extremities spontaneously. Psych: Normal affect.  Labs    CBC  Recent Labs  09/05/15 1129  WBC 11.8*  HGB 9.4*  HCT 29.9*  MCV 88.6  PLT 264    Telemetry    Atrial fibrillation, rate in 90's - low-100's. Occasional peaks into the 120's.   Cardiac Studies and Radiology    Ct Abdomen Pelvis Wo Contrast: 09/01/2015  CLINICAL DATA:  Free intraperitoneal air on KUB.  No abdominal pain EXAM: CT ABDOMEN AND PELVIS WITHOUT CONTRAST TECHNIQUE: Multidetector CT imaging of the abdomen and pelvis was performed following the standard protocol without IV contrast. COMPARISON:  09/01/2015 FINDINGS: Lower chest: Elevated right hemidiaphragm with right lower lobe atelectasis. No pleural effusion. Left lung clear. Mild cardiac enlargement. Coronary calcification. Hemodialysis catheter at the cavoatrial junction. Hepatobiliary: Liver normal in  size and contour without focal lesion. Gallbladder and bile ducts normal. Pancreas: No pancreatic mass or edema. There is free air in the peritoneal cavity as well as around the pancreas and in the mesentery. There is gas just above the pancreatic head. No definite duodenal edema to suggest perforated ulcer however this is in the differential. Spleen: Negative Adrenals/Urinary Tract: Multiple small nonobstructing bilateral renal calculi. 3.7 cm cyst left kidney posteriorly. Small cyst right kidney. No renal mass. Bilateral adrenal enlargement likely due to adrenal adenomata. Left adrenal nodule measures 19 x 29 mm. Urinary bladder normal. Stomach/Bowel: No oral contrast or IV contrast administered. The stomach is nondilated. There is no bowel edema although oral contrast would be helpful in evaluation of the bowel given the amount of free air. There is gas in the retroperitoneum near the pancreas as well as in the mesentery and in the peritoneal cavity. No bowel obstruction. Extensive pan diverticulosis of the colon. No evidence of diverticulitis. There is a large amount of stool in the colon including the rectum. There is a large amount of gas in the left mesentery medial to the left colon. This may be the area perforation, possibly due to perforated diverticulum. Vascular/Lymphatic: Negative for aortic aneurysm. No lymphadenopathy. Reproductive: Calcified uterine fibroid on the left. No pelvic mass. Other: No free fluid.  No abscess. Musculoskeletal: Mild compression fracture L1 of indeterminate age. Disc degeneration and spurring in the lumbar spine. Mild to moderate joint space narrowing in both hip joints. IMPRESSION: Pneumoperitoneum. Moderate amount of free air. There is also gas in the retroperitoneum around the pancreas and gas in the mesentery. Pneumoperitoneum may be related to perforated diverticulosis of the left colon. No evidence of duodenum edema to suggest perforated ulcer disease. Surgical  consultation recommended. Extensive stool throughout the colon. Critical Value/emergent results were called by telephone at the time of interpretation on 09/01/2015 at 6:55 pm to Dr. Vianne Bulls , who verbally acknowledged these results. Electronically Signed   By: Franchot Gallo M.D.   On: 09/01/2015 18:55   Dg Abd 1 View: 09/01/2015  CLINICAL DATA:  Constipation, abdominal pain EXAM: ABDOMEN - 1 VIEW COMPARISON:  None. FINDINGS: There appears to be free intraperitoneal air within the abdomen. I also suspect pneumatosis intestinalis of the left colon. Bowel gas pattern is nonobstructive. Large amount of stool is seen throughout the nondistended colon compatible with the given history of constipation. No soft tissue mass or abnormal fluid collection seen. Lung bases are clear. IMPRESSION: Findings highly suggestive of free intraperitoneal air within the abdomen and/or free retroperitoneal air. In the absence of a recent surgery, this almost certainly indicates bowel perforation. I also suspect pneumatosis intestinalis of the left colon which raises the possibility of associated bowel ischemia. Critical Value/emergent results were called by telephone at  the time of interpretation on 09/01/2015 at 4:30 pm to Dr. Ulice Bold MODY , who verbally acknowledged these results. Electronically Signed   By: Franki Cabot M.D.   On: 09/01/2015 16:35    Echocardiogram: 05/02/2015 Study Conclusions - Left ventricle: The cavity size was normal. There was mild  concentric hypertrophy. Systolic function was normal. The  estimated ejection fraction was in the range of 60% to 65%. Wall  motion was normal; there were no regional wall motion  abnormalities. The study is not technically sufficient to allow  evaluation of LV diastolic function. - Left atrium: The atrium was mildly dilated. - Right ventricle: Systolic function was normal. - Pulmonary arteries: Systolic pressure was mildly elevated PA peak  pressure: 41 mm Hg  (S).  Impressions: - Rhythm is atrial fibrillation.  Assessment & Plan    1. Acute on Chronic Diastolic CHF - Presented with worsening lower extremity edema and mild orthopnea and dyspnea.  - After progressive renal failure despite diuretics, HD initiated 3/6. - Net output of -9.2L this admission. - Volume management per Nephrology. Continue BB. No ACE-I due to ESRD.  2. Chronic atrial fibrillation with variable rate control/RVR - History of chronic, rate-controlled AFib. She presented in atrial fibrillation with RVR, HR in the 140's. Likely secondary to her significant volume overload. - Rates have been mostly controlled on BB and diltiazem throughout admission however, she had recurrence of rapid rates late on 3/11 and early 3/12. Notably, she had not received her AM doses of dilt or metoprolol that day and they were not given until after 9PM on 3/11. IV amio was added in the early morning hours of 3/12. This was discontinued secondary to relative hypotension and both dilt and metoprolol were held. - Ideally, we should be able to rate control her without using amiodarone, as she was able to be rate controlled in the past on Cardizem CD 120mg  daily and Metoprolol 150mg  BID. -Since initiation of HD, her pressures have been trending slightly lower. Amiodarone switched to 200 mg PO daily on 09/05/2015. May need to increase dose with holding BB and Cardizem, for her rates are slightly elevated this AM. - Continue Cardizem 30mg  TID and Lopressor 25mg  BID at current rate. (Will have to hold doses on dialysis mornings (planned for TTS). Once she can reliably take dilt/? blocker w/o hypotension, can probably discontinue Amiodarone.  - This patients CHA2DS2-VASc Score and unadjusted Ischemic Stroke Rate (% per year) is equal to 7.2 % stroke rate/year from a score of 5 (CHF, HTN, Female, Age (2)). Eliquis has been restarted.  3. ESRD - Now on HD, s/p AVG placement 3/10 - Per nephrology.  4.  HTN - BP has been 95/54 - 152/122 in the past 24 hours. - Continue Cardizem 30mg  TID and Lopressor 25mg  BID at current rate. (Holding AM doses on dialysis days - T, Th, Sat to avoid hypotension).  5. Perforated Viscus/Abdominal Free Air - Seen by surgery - conservative therapy as she is asymptomatic.   6. HLD - LDL 65 this admission. Not on statin at this time.  7. OSA  - On CPAP  8. Anemia of Chronic Kidney Disease  - Hgb stable at 9.4.  9. Elevated Troponin - troponin 0.08 on admission. Likely secondary to demand ischemia in the setting of CHF/AF RVR. Renal failure may also be playing a role. - Low-risk MV in 03/2015 (no ischemia). No plan for further ischemic evaluation at this time.   Signed, Erma Heritage , PA-C  10:27 AM 09/06/2015 Pager: [phone]   Attending Note Patient seen and examined, agree with detailed note above,  Patient presentation and plan discussed on rounds.   Medications held this morning for dialysis including metoprolol 25 mg daily, diltiazem 30 mg She was given amiodarone 200 mg 1, heart rate is elevated Asymptomatic She has challenging to control atrial fibrillation especially on days when she has dialysis as all of her medications are held in the morning  Recommend we increase amiodarone up to 200 mg twice a day for rate control -Unable to give digoxin Blood pressure will not tolerate medications the morning of dialysis  On nondialysis days, would give diltiazem 39 g 3 times a day and metoprolol 25 mill grams twice a day   discussed details with nursing and case manager Discharge planning, gated by heart rate control We will try higher dose amiodarone for rate control  Greater than 50% was spent in counseling and coordination of care with patient Total encounter time 35 minutes or more   Signed: Esmond Plants  M.D., Ph.D. [facility]

## 2015-09-06 NOTE — Progress Notes (Signed)
CSW informed RN that patient has to be able to sit in the chair for outpatient dialysis. CSW will follow up on patient's ability to sit during dialysis treatment today. CSW will continue to follow and assist.   Ernest Pine, MSW, [place] Work Department [phone]

## 2015-09-06 NOTE — Care Management (Signed)
Informed that discharge will be held today due to elevated heart rate.  Informed that her beta blockers and cardizem are held prior to her dialysis sessions.  Discussed if meds that are used to control her heart rate  are being held prior to dialysis- and heart rate gets significantly elevated,  how is this to be managed post discharge.  CM will reach out to attending and cardiology.

## 2015-09-06 NOTE — Progress Notes (Signed)
Spoke with Dr Donivan Scull PA Gari Crown concerning which medications to hold and give before dialysis due to increase in heart rate stated to hold metoprolol and diltiazem before dialysis but to give amiodarone.

## 2015-09-06 NOTE — Progress Notes (Signed)
Patient back from dialysis and tolerated dialysis well in chair. V.S. Stable and heart rate 109. Patient back in chair with bed in the lowest position call light in reach and bed alarm on. No complaints at this time. Will continue to monitor.

## 2015-09-06 NOTE — Progress Notes (Signed)
PT Cancellation Note  Patient Details Name: Teresa Lutz MRN: [id number] DOB: [birth year]   Cancelled Treatment:    Reason Eval/Treat Not Completed: Patient at procedure or test/unavailable.  Pt currently off floor at dialysis.  Will re-attempt PT at a later date/time.   Raquel Sarna [name] 09/06/2015, 1:19 PM Leitha Bleak, [employer]

## 2015-09-07 DIAGNOSIS — I110 Hypertensive heart disease with heart failure: Secondary | ICD-10-CM

## 2015-09-07 LAB — GLUCOSE, CAPILLARY
GLUCOSE-CAPILLARY: 101 mg/dL — AB (ref 65–99)
GLUCOSE-CAPILLARY: 91 mg/dL (ref 65–99)
Glucose-Capillary: 94 mg/dL (ref 65–99)
Glucose-Capillary: 102 mg/dL — ABNORMAL HIGH (ref 65–99)

## 2015-09-07 LAB — CBC
HEMATOCRIT: 31.2 % — AB (ref 35.0–47.0)
Hemoglobin: 10 g/dL — ABNORMAL LOW (ref 12.0–16.0)
MCH: 27.4 pg (ref 26.0–34.0)
MCHC: 32 g/dL (ref 32.0–36.0)
MCV: 85.5 fL (ref 80.0–100.0)
Platelets: 190 10*3/uL (ref 150–440)
RBC: 3.65 MIL/uL — ABNORMAL LOW (ref 3.80–5.20)
RDW: 16.8 % — AB (ref 11.5–14.5)
WBC: 9.3 10*3/uL (ref 3.6–11.0)

## 2015-09-07 MED ORDER — CARVEDILOL 3.125 MG PO TABS
3.1250 mg | ORAL_TABLET | Freq: Every day | ORAL | Status: DC
  Administered 2015-09-07: 3.125 mg via ORAL
  Filled 2015-09-07: qty 1

## 2015-09-07 NOTE — Progress Notes (Signed)
Hospital Problem List     Principal Problem:   Acute on chronic diastolic (congestive) heart failure (HCC) Active Problems:   Hyperlipidemia   Chronic atrial fibrillation (HCC)   Anemia of chronic disease   Atrial fibrillation with RVR (HCC)   Pressure ulcer   End stage renal disease (HCC)   Generalized edema   Hypoalbuminemia due to protein-calorie malnutrition (HCC)   Hypertensive heart disease   Atrial fibrillation with rapid ventricular response ([place])   Free intraperitoneal air   Perforated diverticulum of large intestine   Acute kidney failure, unspecified ([place])    Patient Profile:   Primary Cardiologist: Dr. Fletcher Anon  81 y.o. female w/ PMH of chronic atrial fibrillation (on Eliquis), chronic diastolic CHF, Stage 4 CKD, Pulmonary HTN, HLD, OSA (on CPAP), Type 2 DM and HTN admitted on 08/22/2015 for acute on chronic diastolic CHF. Started on HD 08/29/2015  Subjective   Flat affect this morning. Denies any current symptoms. Reports no issues of dizziness of pre-syncope while receiving HD yesterday.  Inpatient Medications    . amiodarone  200 mg Oral BID  . apixaban  5 mg Oral BID  . diltiazem  30 mg Oral TID  . epoetin (EPOGEN/PROCRIT) injection  10,000 Units Intravenous Q T,Th,Sa-HD  . ferrous sulfate  325 mg Oral Q breakfast  . insulin aspart  0-9 Units Subcutaneous TID WC  . letrozole  2.5 mg Oral Daily  . levothyroxine  25 mcg Oral QAC breakfast  . metoprolol  25 mg Oral BID  . polyethylene glycol  17 g Oral Daily  . senna-docusate  2 tablet Oral BID  . sodium chloride flush  3 mL Intravenous Q12H    Vital Signs    Filed Vitals:   09/06/15 1939 09/07/15 0446 09/07/15 0500 09/07/15 0735  BP: 117/69 118/55  108/68  Pulse: 99 109  88  Temp: 97.4 F (36.3 C) 98.4 F (36.9 C)  98.7 F (37.1 C)  TempSrc: Oral Oral  Oral  Resp: 18 19  17   Height:      Weight:   198 lb 14.4 oz (90.22 kg)   SpO2: 100% 96%  100%    Intake/Output Summary (Last 24 hours) at  09/07/15 1014 Last data filed at 09/07/15 0851  Gross per 24 hour  Intake    123 ml  Output   1000 ml  Net   -877 ml   Filed Weights   09/06/15 1250 09/06/15 1609 09/07/15 0500  Weight: 197 lb 5 oz (89.5 kg) 192 lb 0.3 oz (87.1 kg) 198 lb 14.4 oz (90.22 kg)    Physical Exam    General: Well developed, elderly African American female appearing in no acute distress. Head: Normocephalic, atraumatic.  Neck: Supple without bruits, JVD not elevated. Lungs:  Resp regular and unlabored. No wheezing or rales appreciated. Heart: Irregularly irregular, S1, S2, no S3, S4, or murmur; no rub. Abdomen: Soft, non-tender, non-distended with normoactive bowel sounds. No hepatomegaly. No rebound/guarding. No obvious abdominal masses. Extremities: No clubbing or cyanosis, trace edema bilaterally. Distal pedal pulses are 2+ bilaterally. Neuro: Alert and oriented X 3. Moves all extremities spontaneously. Psych: Normal affect.  Labs    CBC  Recent Labs  09/05/15 1129  WBC 11.8*  HGB 9.4*  HCT 29.9*  MCV 88.6  PLT 264    Telemetry    Atrial Fibrillation, HR in 90's - 110's.   Cardiac Studies and Radiology    Ct Abdomen Pelvis Wo Contrast: 09/01/2015  CLINICAL  DATA:  Free intraperitoneal air on KUB.  No abdominal pain EXAM: CT ABDOMEN AND PELVIS WITHOUT CONTRAST TECHNIQUE: Multidetector CT imaging of the abdomen and pelvis was performed following the standard protocol without IV contrast. COMPARISON:  09/01/2015 FINDINGS: Lower chest: Elevated right hemidiaphragm with right lower lobe atelectasis. No pleural effusion. Left lung clear. Mild cardiac enlargement. Coronary calcification. Hemodialysis catheter at the cavoatrial junction. Hepatobiliary: Liver normal in size and contour without focal lesion. Gallbladder and bile ducts normal. Pancreas: No pancreatic mass or edema. There is free air in the peritoneal cavity as well as around the pancreas and in the mesentery. There is gas just above the  pancreatic head. No definite duodenal edema to suggest perforated ulcer however this is in the differential. Spleen: Negative Adrenals/Urinary Tract: Multiple small nonobstructing bilateral renal calculi. 3.7 cm cyst left kidney posteriorly. Small cyst right kidney. No renal mass. Bilateral adrenal enlargement likely due to adrenal adenomata. Left adrenal nodule measures 19 x 29 mm. Urinary bladder normal. Stomach/Bowel: No oral contrast or IV contrast administered. The stomach is nondilated. There is no bowel edema although oral contrast would be helpful in evaluation of the bowel given the amount of free air. There is gas in the retroperitoneum near the pancreas as well as in the mesentery and in the peritoneal cavity. No bowel obstruction. Extensive pan diverticulosis of the colon. No evidence of diverticulitis. There is a large amount of stool in the colon including the rectum. There is a large amount of gas in the left mesentery medial to the left colon. This may be the area perforation, possibly due to perforated diverticulum. Vascular/Lymphatic: Negative for aortic aneurysm. No lymphadenopathy. Reproductive: Calcified uterine fibroid on the left. No pelvic mass. Other: No free fluid.  No abscess. Musculoskeletal: Mild compression fracture L1 of indeterminate age. Disc degeneration and spurring in the lumbar spine. Mild to moderate joint space narrowing in both hip joints. IMPRESSION: Pneumoperitoneum. Moderate amount of free air. There is also gas in the retroperitoneum around the pancreas and gas in the mesentery. Pneumoperitoneum may be related to perforated diverticulosis of the left colon. No evidence of duodenum edema to suggest perforated ulcer disease. Surgical consultation recommended. Extensive stool throughout the colon. Critical Value/emergent results were called by telephone at the time of interpretation on 09/01/2015 at 6:55 pm to Dr. Vianne Bulls , who verbally acknowledged these results.  Electronically Signed   By: Franchot Gallo M.D.   On: 09/01/2015 18:55   Dg Abd 1 View: 09/01/2015  CLINICAL DATA:  Constipation, abdominal pain EXAM: ABDOMEN - 1 VIEW COMPARISON:  None. FINDINGS: There appears to be free intraperitoneal air within the abdomen. I also suspect pneumatosis intestinalis of the left colon. Bowel gas pattern is nonobstructive. Large amount of stool is seen throughout the nondistended colon compatible with the given history of constipation. No soft tissue mass or abnormal fluid collection seen. Lung bases are clear. IMPRESSION: Findings highly suggestive of free intraperitoneal air within the abdomen and/or free retroperitoneal air. In the absence of a recent surgery, this almost certainly indicates bowel perforation. I also suspect pneumatosis intestinalis of the left colon which raises the possibility of associated bowel ischemia. Critical Value/emergent results were called by telephone at the time of interpretation on 09/01/2015 at 4:30 pm to Dr. Ulice Bold MODY , who verbally acknowledged these results. Electronically Signed   By: Franki Cabot M.D.   On: 09/01/2015 16:35    Echocardiogram: 05/02/2015 Study Conclusions - Left ventricle: The cavity size was normal. There was  mild  concentric hypertrophy. Systolic function was normal. The  estimated ejection fraction was in the range of 60% to 65%. Wall  motion was normal; there were no regional wall motion  abnormalities. The study is not technically sufficient to allow  evaluation of LV diastolic function. - Left atrium: The atrium was mildly dilated. - Right ventricle: Systolic function was normal. - Pulmonary arteries: Systolic pressure was mildly elevated PA peak  pressure: 41 mm Hg (S).  Impressions: - Rhythm is atrial fibrillation.  Assessment & Plan    1. Acute on Chronic Diastolic CHF - Presented with worsening lower extremity edema and mild orthopnea and dyspnea.  - After progressive renal failure  despite diuretics, HD initiated 3/6.She still has 2+ pitting edema - Net output of -10.1L this admission. - Volume management per Nephrology. Continue BB. No ACE-I due to ESRD.  2. Chronic atrial fibrillation with variable rate control/RVR - History of chronic, rate-controlled AFib. She presented in atrial fibrillation with RVR, HR in the 140's. Likely secondary to her significant volume overload. - Rates have been mostly controlled on BB and diltiazem throughout admission however, she had recurrence of rapid rates late on 3/11 and early 3/12. Notably, she had not received her AM doses of dilt or metoprolol that day and they were not given until after 9PM on 3/11. IV amio was added in the early morning hours of 3/12. This was discontinued secondary to relative hypotension and both dilt and metoprolol were held. - Ideally, we should be able to rate control her without using amiodarone, as she was able to be rate controlled in the past on Cardizem CD 120mg  daily and Metoprolol 150mg  BID. -Since initiation of HD, her pressures have been trending slightly lower. Amiodarone switched to 200 mg PO daily on 09/05/2015 and increased to BID dosing on 09/06/2015.  - (Will have to hold doses on dialysis mornings (planned for TTS). Once she can reliably take dilt/? blocker w/o hypotension, can probably discontinue Amiodarone.  - This patients CHA2DS2-VASc Score and unadjusted Ischemic Stroke Rate (% per year) is equal to 7.2 % stroke rate/year from a score of 5 (CHF, HTN, Female, Age (2)). Eliquis has been restarted. - talked with Dr. Candiss Norse at the bedside who recommended Coreg at night prior to dialysis due to the Metoprolol being dialyzed out. Due to high-potency of Coreg, will initiate at 3.125mg  nightly and titrate if BP allows. Continue Cardizem 30mg  TID on non-dialysis days.  3. ESRD - Now on HD, s/p AVG placement 3/10 - Per nephrology.  4. HTN - BP has been 95/54 - 152/122 in the past 24 hours. -  Continue Cardizem 30mg  TID and Coreg as above. (Holding AM doses on dialysis days - T, Th, Sat to avoid hypotension).  5. Perforated Viscus/Abdominal Free Air - Seen by surgery - conservative therapy as she is asymptomatic.   6. HLD - LDL 65 this admission. Not on statin at this time.  7. OSA  - On CPAP  8. Anemia of Chronic Kidney Disease  - Hgb stable at 9.4 on 09/05/2015. Repeat CBC pending today.  9. Elevated Troponin - troponin 0.08 on admission. Likely secondary to demand ischemia in the setting of CHF/AF RVR. Renal failure may also be playing a role. - Low-risk MV in 03/2015 (no ischemia). No plan for further ischemic evaluation at this time.  Signed, Erma Heritage , PA-C 10:14 AM 09/07/2015 Pager: [phone]   I have seen, examined and evaluated the patient this AM along with  Bernerd Pho, [place].  After reviewing all the available data and chart,  I agree with her findings, examination as well as impression recommendations.  Continues to have volume overload, managed by nephrology with dialysis. She still has 2+ pitting edema. She did not even wake up despite my trying to arouse her she opened her eyes and look to me, but did not talk.   I discussed her with Ms. Ahmed Prima. She has been having A. fib with borderline rate control, amiodarone dose was increased to 200 twice a day yesterday. Unfortunately her heart rates are still in the low 110s. While this is not overly concerning, it may cause problems with discharge back to skilled nursing. After discussing with Dr. Candiss Norse from nephrology, we will convert from metoprolol to carvedilol for additional rate control as this is not as likely to be dialyzed out. Unfortunately we are somewhat limited by her hypotension.  If we are unable to rate control, and this continues to be an issue, would consider discussing with electrophysiology the potential of AV nodal ablation with pacemaker, although with a dialysis patient this is  suboptimal option, unless she has a wireless pacemaker placed. It sounds like she has chronic A. fib, and is likely did not convert to sinus rhythm even if she is on amiodarone and electrical cardioversion is attempted.  She is on ELIQUIS for anticoagulation, will need pharmacy to clarify whether or not she needs adjusted dosing based on her being a dialysis patient.    Leonie Man, M.D., M.S. Interventional Cardiologist   Pager # [phone] Phone # [phone] [address] [place], [place] [address]

## 2015-09-07 NOTE — Progress Notes (Signed)
[facility]  ROUNDING NOTE   Subjective:   Talks very little Denies acute c./o Appetite remains poor   Objective:  Vital signs in last 24 hours:  Temp:  [97.4 F (36.3 C)-98.7 F (37.1 C)] 98.7 F (37.1 C) (03/15 0735) Pulse Rate:  [88-112] 88 (03/15 0735) Resp:  [17-19] 17 (03/15 0735) BP: (100-127)/(55-76) 108/68 mmHg (03/15 0735) SpO2:  [96 %-100 %] 100 % (03/15 0735) Weight:  [87.1 kg (192 lb 0.3 oz)-90.22 kg (198 lb 14.4 oz)] 90.22 kg (198 lb 14.4 oz) (03/15 0500)  Weight change: -4.349 kg (-9 lb 9.4 oz) Filed Weights   09/06/15 1250 09/06/15 1609 09/07/15 0500  Weight: 89.5 kg (197 lb 5 oz) 87.1 kg (192 lb 0.3 oz) 90.22 kg (198 lb 14.4 oz)    Intake/Output: I/O last 3 completed shifts: In: 120 [P.O.:120] Out: 1000 [Other:1000]   Intake/Output this shift:  Total I/O In: 123 [P.O.:120; I.V.:3] Out: -   Physical Exam: General: NAD, resting in bed  Head: Moist oral mucosal membranes  Eyes: Anicteric   Neck: Supple, trachea midline  Lungs:  clear  Heart: irregular  Abdomen:  Soft, nontender, BS presemt   Extremities: 2+ peripheral edema in all 4 extremities  Neurologic: Nonfocal, moving all four extremities. Able to follow commands  Skin: No lesions  Access:  RIJ permcath 3/6 Dr. Lucky Cowboy, left arm AVF 3/10 Dr. Delana Meyer.     Basic Metabolic Panel:  Recent Labs Lab 09/01/15 0429 09/02/15 0409 09/03/15 0548  NA 139 137 135  K 4.1 4.1 4.7  CL 98* 98* 95*  CO2 28 28 27   GLUCOSE 199* 192* 182*  BUN 65* 50* 73*  CREATININE 3.14* 2.79* 3.88*  CALCIUM 10.5* 10.4* 10.7*  PHOS 5.0*  --   --     Liver Function Tests: No results for input(s): AST, ALT, ALKPHOS, BILITOT, PROT, ALBUMIN in the last 168 hours. No results for input(s): LIPASE, AMYLASE in the last 168 hours. No results for input(s): AMMONIA in the last 168 hours.  CBC:  Recent Labs Lab 09/01/15 0429 09/02/15 0409 09/03/15 0548 09/05/15 1129  WBC 12.8* 13.9* 13.6* 11.8*  HGB  8.9* 9.6* 9.7* 9.4*  HCT 28.1* 29.8* 30.2* 29.9*  MCV 88.7 86.7 88.8 88.6  PLT 321 340 354 264    Cardiac Enzymes: No results for input(s): CKTOTAL, CKMB, CKMBINDEX, TROPONINI in the last 168 hours.  BNP: Invalid input(s): POCBNP  CBG:  Recent Labs Lab 09/05/15 2047 09/06/15 0726 09/06/15 1641 09/06/15 2052 09/07/15 0722  GLUCAP 168* 138* 80 61 101*    Microbiology: Results for orders placed or performed during the hospital encounter of 08/22/15  MRSA PCR Screening     Status: None   Collection Time: 08/22/15  6:36 PM  Result Value Ref Range Status   MRSA by PCR NEGATIVE NEGATIVE Final    Comment:        The GeneXpert MRSA Assay (FDA approved for NASAL specimens only), is one component of a comprehensive MRSA colonization surveillance program. It is not intended to diagnose MRSA infection nor to guide or monitor treatment for MRSA infections.   Urine culture     Status: None   Collection Time: 08/24/15 11:18 AM  Result Value Ref Range Status   Specimen Description URINE, RANDOM  Final   Special Requests NONE  Final   Culture >=100,000 COLONIES/mL KLEBSIELLA PNEUMONIAE  Final   Report Status 08/27/2015 FINAL  Final   Organism ID, Bacteria KLEBSIELLA PNEUMONIAE  Final  Susceptibility   Klebsiella pneumoniae - MIC*    AMPICILLIN RESISTANT Resistant     CEFAZOLIN <=4 SENSITIVE Sensitive     CEFTRIAXONE <=1 SENSITIVE Sensitive     CIPROFLOXACIN <=0.25 SENSITIVE Sensitive     GENTAMICIN <=1 SENSITIVE Sensitive     IMIPENEM <=0.25 SENSITIVE Sensitive     NITROFURANTOIN 64 INTERMEDIATE Intermediate     TRIMETH/SULFA <=20 SENSITIVE Sensitive     AMPICILLIN/SULBACTAM <=2 SENSITIVE Sensitive     PIP/TAZO <=4 SENSITIVE Sensitive     Extended ESBL NEGATIVE Sensitive     * >=100,000 COLONIES/mL KLEBSIELLA PNEUMONIAE    Coagulation Studies: No results for input(s): LABPROT, INR in the last 72 hours.  Urinalysis: No results for input(s): COLORURINE, LABSPEC,  PHURINE, GLUCOSEU, HGBUR, BILIRUBINUR, KETONESUR, PROTEINUR, UROBILINOGEN, NITRITE, LEUKOCYTESUR in the last 72 hours.  Invalid input(s): APPERANCEUR    Imaging: No results found.   Medications:     . amiodarone  200 mg Oral BID  . apixaban  5 mg Oral BID  . carvedilol  3.125 mg Oral Q2200  . diltiazem  30 mg Oral TID  . epoetin (EPOGEN/PROCRIT) injection  10,000 Units Intravenous Q T,Th,Sa-HD  . ferrous sulfate  325 mg Oral Q breakfast  . insulin aspart  0-9 Units Subcutaneous TID WC  . letrozole  2.5 mg Oral Daily  . levothyroxine  25 mcg Oral QAC breakfast  . polyethylene glycol  17 g Oral Daily  . senna-docusate  2 tablet Oral BID  . sodium chloride flush  3 mL Intravenous Q12H   sodium chloride, acetaminophen **OR** acetaminophen, albuterol, hydrALAZINE, ondansetron **OR** ondansetron (ZOFRAN) IV, oxyCODONE-acetaminophen, sodium chloride flush, sodium phosphate  Assessment/ Plan:  81 y.o. female with atrial fibrillation, anemia, coronary artery disease, diabetes mellitus type 2, hypertension, hyperlipidemia, history of breast cancer, CKD stage IV/V who presents with increasing shortness of breath and edema.  1. End stage renal disease: first dialysis on 08/29/15. RIJ permcath. AVF placed 3/10 by Dr. Delana Meyer  - following TTS schedule in hospital -  discharge planning. Craig follow with [facility]  2. Hypertension and Diastolic Congestive Heart Failure with lower extremity edema and anasarca. Also with atrial fibrillation with RVR. Echocardiogram from 05/02/15.  Blood pressure at goal. - Ultrafiltration with dialysis  - Diltiazem and metoprolol - consider changing metoprolol to Coreg (once daily at night) as it is not dialyzed off  3. Anemia of chronic kidney disease: hemoglobin 9.4 - EPO with HD treatment.   4. Secondary hyperparathyroidism: with hypercalcemia: phosphorus at goal. PTH 378 holding calcitriol due to hypercalcemia.  - will look into cinacalcet as  outpatient.    LOS: 16 [place] Sunday 3/15/201710:52 AM

## 2015-09-07 NOTE — Progress Notes (Signed)
[facility] [place] NAME: Teresa Lutz    MR#:  [id number]  DATE OF BIRTH:  03/25/1935  SUBJECTIVE:   Patient has no concerns. Continues to have atrial fibrillation with heart rate fluctuating from 100 to 120s   REVIEW OF SYSTEMS:   CONSTITUTIONAL: No fever. Fatigue positive EYES: No blurred or double vision.  EARS, NOSE, AND THROAT: No tinnitus or ear pain.  RESPIRATORY: No cough, shortness of breath, wheezing or hemoptysis.  CARDIOVASCULAR: No chest pain, positive orthopnea, positive edema.  GASTROINTESTINAL: No nausea, vomiting, diarrhea or abdominal pain.  GENITOURINARY: No dysuria, hematuria.  ENDOCRINE: No polyuria, nocturia,  HEMATOLOGY: No anemia, easy bruising or bleeding SKIN: No rash or lesion. MUSCULOSKELETAL: No joint pain or arthritis.   NEUROLOGIC: No tingling, numbness, weakness.  PSYCHIATRY: No anxiety or depression.   DRUG ALLERGIES:   Allergies  Allergen Reactions  . Celebrex [Celecoxib] Hives    VITALS:  Blood pressure 113/54, pulse 82, temperature 98 F (36.7 C), temperature source Oral, resp. rate 18, height 5\' 7"  (1.702 m), weight 90.22 kg (198 lb 14.4 oz), SpO2 99 %.  PHYSICAL EXAMINATION:  VITAL SIGNS: Filed Vitals:   09/07/15 0735 09/07/15 1117  BP: 108/68 113/54  Pulse: 88 82  Temp: 98.7 F (37.1 C) 98 F (36.7 C)  Resp: 42 18   GENERAL:81 y.o.female not in acute distress. Obese. HEAD: Normocephalic, atraumatic.  EYES: Pupils equal, round, reactive to light. Extraocular muscles intact. No scleral icterus.  MOUTH: Moist mucosal membrane. Dentition intact. No abscess noted.  EAR, NOSE, THROAT: Clear without exudates. No external lesions.  NECK: Supple. No thyromegaly. No nodules. No JVD.  PULMONARY: No wheeze, rales or rhonci. No use of accessory muscles, Good respiratory effort. good air entry bilaterally CHEST: Nontender to palpation.  CARDIOVASCULAR: Irregular.Tachycardia. No  murmurs, rubs, or gallops. 1+ edema on arms, trace edema on legs.  GASTROINTESTINAL: Soft, nontender, nondistended. No masses. Positive bowel sounds. No hepatosplenomegaly.  MUSCULOSKELETAL:  Range of motion decreased in all extremities.  NEUROLOGIC: Cranial nerves II through XII are intact. No gross focal neurological deficits.  SKIN: No ulceration, lesions, rashes, or cyanosis. Skin warm and dry. Turgor intact.  PSYCHIATRIC: alert, awake and oriented.   left brachial axillary arteriovenous graft .  LABORATORY PANEL:   CBC  Recent Labs Lab 09/05/15 1129  WBC 11.8*  HGB 9.4*  HCT 29.9*  PLT 264   ------------------------------------------------------------------------------------------------------------------  Chemistries   Recent Labs Lab 09/03/15 0548  NA 135  K 4.7  CL 95*  CO2 27  GLUCOSE 182*  BUN 73*  CREATININE 3.88*  CALCIUM 10.7*     ASSESSMENT AND PLAN:   81 year old African female history of atrial fibrillation, chronic kidney disease stage V, congestive heart failure ejection fraction 60-65 presentedith worsening edema  *Acute on chronic diastolic congestive heart failure:due to worsening of renal function Continue hemodialysis as scheduled by nephrology, T/T/S.  Improving  * erforated viscus: Patient has no symptoms or abdominal findings. Conservative management as per surgery. No acute indication for surgery.  *Atrial fibrillation rapid ventricular response: Heart rates are not controlled. Amiodarone drip stopped and changed to oral amiodarone. Dose increased yesterday. Also on oral Cardizem and metoprolol. Discussed with Dr. Ellyn Hack of cardiology.  * d stage renal disease: s/p  tunneled hemodialysis catheter via the right internal jugular vein by Dr Lucky Cowboy on 3/6 S/p left brachial axillary arteriovenous graft placement on 3/10. Outpatient hemodialysis is being set up Nephrology follow-up  *Urinary  tract infection site unspecified due to  klebsiella pneumoniae Finished Keflex  *ypothyroidism  Continue Synthroid  *Hypertension essential On oprolol, diltiazem   *Venous thromboembolism prophylactic On Eliquis  * Anemia of chronic disease EPO  *Generalized weakness: continue PT.  *constipation Continue laxatives, enema prn.  All the records are reviewed and case discussed with Care Management/Social Workerr. Management plans discussed with the patient, family and they are in agreement.  CODE STATUS: Full code  TOTAL TIME TAKING CARE OF THIS PATIENT: 33 minutes.  Greater than 50% time was spent on coordination of care and face-to-face counseling.  POSSIBLE D/C IN 2 DAYS, DEPENDING ON CLINICAL CONDITION.    Hillary Bow R M.D on 09/07/2015 at 12:52 PM  Between 7am to 6pm - Pager - [phone]  After 6pm: House Pager: - [facility]  Office  [phone]  CC: Primary care physician; Casilda Carls, MD

## 2015-09-07 NOTE — Progress Notes (Signed)
Pt digitally disimpacted at pt's request, orders received from Dr. Marcille Blanco. Moderate amount of soft brownish-black stool removed. Pt states relief. RN will continue to monitor. Rachael Fee, RN

## 2015-09-07 NOTE — Care Management (Signed)
Barrier to discharge- continues to require changes in cardiac meds to control heart rate.

## 2015-09-07 NOTE — Plan of Care (Signed)
Problem: Tissue Perfusion: Goal: Risk factors for ineffective tissue perfusion will decrease Outcome: Progressing SCD's

## 2015-09-07 NOTE — Progress Notes (Signed)
PT Cancellation Note  Patient Details Name: Chanty Baysinger MRN: [id number] DOB: [birth year]   Cancelled Treatment:    Reason Eval/Treat Not Completed: Patient declined, no reason specified. Treatment attempted this morning. Pt refuses despite encouragement. Pt states nothing hurts and there is no problem, "I just don't feel like it." Re attempt tomorrow.    Charlaine Dalton, [place] 09/07/2015, 12:25 PM

## 2015-09-07 NOTE — Progress Notes (Signed)
Initial Nutrition Assessment  INTERVENTION:   Meals and Snacks: Cater to patient preferences Medical Food Supplement Therapy: add Might[employer]akes TID with meals for added nutrition  NUTRITION DIAGNOSIS:   Inadequate oral intake related to acute illness, poor appetite as evidenced by meal completion < 50%.  GOAL:   Patient will meet greater than or equal to 90% of their needs   MONITOR:   PO intake, Supplement acceptance, Labs, Weight trends, I & O's  REASON FOR ASSESSMENT:   LOS, Diagnosis    ASSESSMENT:    Pt admitted with acute on chronic CHF due to worsening renal function, started on HD, afib with RVR, perforated viscous (conservative management per surgery); alert on visit today, affect appears flat, not very talkative  Past Medical History  Diagnosis Date  . Arthritis 1990  . Breast cancer ([place]) 2012    Right stage 1  . Ulcer 1980  . Malignant neoplasm of upper-inner quadrant of female breast ([place])     right  . Hemorrhoid   . Unspecified vitamin D deficiency   . Anemia of chronic disease   . Hypopotassemia   . Unspecified hypothyroidism   . Goiter, specified as simple   . Hyperlipidemia   . CKD (chronic kidney disease), stage IV ([place])   . Hypertension 1960  . Chronic atrial fibrillation ([place]) 2014    a. Eliquis 5 mg bid; CHADSVASc 5 (CHF, HTN, age x 2, female)  . Lower extremity edema   . Chronic diastolic CHF (congestive heart failure) (HCC)     a. EF 60-65% by echo in 04/2015  . Pulmonary HTN ([place])     a. PA peak pressure 41 mmHg by echo in 04/2015    Diet Order:  Diet renal with fluid restriction Fluid restriction:: 1200 mL Fluid; Room service appropriate?: Yes; Fluid consistency:: Thin Diet - low sodium heart healthy  Energy Intake: recorded po intake 46% on average, pt reports appetite is ok  Digestive System: pt reports no abdominal pain, N/V, constipated   Recent Labs Lab 09/01/15 0429 09/02/15 0409 09/03/15 0548  NA 139 137 135  K 4.1  4.1 4.7  CL 98* 98* 95*  CO2 28 28 27   BUN 65* 50* 73*  CREATININE 3.14* 2.79* 3.88*  CALCIUM 10.5* 10.4* 10.7*  PHOS 5.0*  --   --   GLUCOSE 199* 192* 182*    Glucose Profile:   Recent Labs  09/06/15 2052 09/07/15 0722 09/07/15 1150  GLUCAP 83 101* 94    Skin:   (deep tissue injury to heel)  Meds: ss novolog, senokot, fleet enema, miralax  Height:   Ht Readings from Last 1 Encounters:  09/02/15 5\' 7"  (1.702 m)    Weight: pt reports wt has been stable  Wt Readings from Last 1 Encounters:  09/07/15 198 lb 14.4 oz (90.22 kg)    Wt Readings from Last 10 Encounters:   09/07/15 198 lb 14.4 oz (90.22 kg)   08/16/15 216 lb 12 oz (98.317 kg)   08/01/15 194 lb (87.998 kg)   06/09/15 198 lb 10.2 oz (90.1 kg)   05/16/15 192 lb (87.091 kg)   04/15/15 192 lb 5.6 oz (87.25 kg)   03/24/15 207 lb (93.895 kg)   03/17/15 218 lb (98.884 kg)   03/03/15 214 lb (97.07 kg)   02/17/15 218 lb (98.884 kg)    BMI:  Body mass index is 31.14 kg/(m^2).  Estimated Nutritional Needs:   Kcal:  1525-1830 kcals   Protein:  73-92 g (  1.2-1.5 g/kg)   Fluid:  1525-1830 mL   EDUCATION NEEDS:   Education needs no appropriate at this time   [place], [place], LDN [phone] Pager  [phone] Weekend/On-Call Pager

## 2015-09-07 NOTE — Care Management Important Message (Signed)
Important Message  Patient Details  Name: Teresa Lutz MRN: [id number] Date of Birth: February 26, 1935   Medicare Important Message Given:  Yes    Juliann Pulse A Sohana Austell 09/07/2015, 10:29 AM

## 2015-09-08 ENCOUNTER — Telehealth: Payer: Self-pay | Admitting: Cardiovascular Disease

## 2015-09-08 LAB — GLUCOSE, CAPILLARY
GLUCOSE-CAPILLARY: 170 mg/dL — AB (ref 65–99)
GLUCOSE-CAPILLARY: 129 mg/dL — AB (ref 65–99)
Glucose-Capillary: 117 mg/dL — ABNORMAL HIGH (ref 65–99)

## 2015-09-08 MED ORDER — POLYETHYLENE GLYCOL 3350 17 G PO PACK: 17.0000 g | PACK | Freq: Every day | ORAL | Status: DC

## 2015-09-08 MED ORDER — AMIODARONE HCL 200 MG PO TABS: 200.0000 mg | ORAL_TABLET | Freq: Two times a day (BID) | ORAL | Status: DC

## 2015-09-08 MED ORDER — MIDODRINE HCL 10 MG PO TABS: 10.0000 mg | ORAL_TABLET | ORAL | Status: AC

## 2015-09-08 MED ORDER — DILTIAZEM HCL 30 MG PO TABS: 30.0000 mg | ORAL_TABLET | Freq: Three times a day (TID) | ORAL | Status: DC

## 2015-09-08 MED ORDER — CARVEDILOL 3.125 MG PO TABS: 3.1250 mg | ORAL_TABLET | Freq: Two times a day (BID) | ORAL | Status: AC

## 2015-09-08 MED ORDER — MIDODRINE HCL 5 MG PO TABS
10.0000 mg | ORAL_TABLET | ORAL | Status: DC
  Administered 2015-09-08: 10 mg via ORAL
  Filled 2015-09-08: qty 2

## 2015-09-08 MED ORDER — GUAIFENESIN-CODEINE 100-10 MG/5ML PO SOLN: 5.0000 mL | Freq: Four times a day (QID) | ORAL | Status: DC | PRN

## 2015-09-08 MED ORDER — MENTHOL 3 MG MT LOZG
1.0000 | LOZENGE | OROMUCOSAL | Status: DC | PRN
  Filled 2015-09-08: qty 9

## 2015-09-08 MED ORDER — SENNOSIDES-DOCUSATE SODIUM 8.6-50 MG PO TABS: 2.0000 | ORAL_TABLET | Freq: Two times a day (BID) | ORAL | Status: DC

## 2015-09-08 NOTE — Progress Notes (Signed)
Hospital Problem List     Principal Problem:   Acute on chronic diastolic (congestive) heart failure (HCC) Active Problems:   Hyperlipidemia   Chronic atrial fibrillation (HCC)   Anemia of chronic disease   Atrial fibrillation with RVR (HCC)   Pressure ulcer   End stage renal disease (HCC)   Generalized edema   Hypoalbuminemia due to protein-calorie malnutrition (HCC)   Hypertensive heart disease   Atrial fibrillation with rapid ventricular response ([place])   Free intraperitoneal air   Perforated diverticulum of large intestine   Acute kidney failure, unspecified ([place])    Patient Profile:   Primary Cardiologist: Dr. Fletcher Anon  81 y.o. female w/ PMH of chronic atrial fibrillation (on Eliquis), chronic diastolic CHF, Stage 4 CKD, Pulmonary HTN, HLD, OSA (on CPAP), Type 2 DM and HTN admitted on 08/22/2015 for acute on chronic diastolic CHF. Started on HD 08/29/2015  Subjective   Reports "feeling the same". Does not feel like getting out of bed, no complaints otherwise Did not like her breakfast, cannot read the menu to fill out the lunch request Denies shortness of breath, does not feel any tachycardia or palpitations  Inpatient Medications    . amiodarone  200 mg Oral BID  . apixaban  5 mg Oral BID  . carvedilol  3.125 mg Oral Q2200  . diltiazem  30 mg Oral TID  . epoetin (EPOGEN/PROCRIT) injection  10,000 Units Intravenous Q T,Th,Sa-HD  . ferrous sulfate  325 mg Oral Q breakfast  . insulin aspart  0-9 Units Subcutaneous TID WC  . letrozole  2.5 mg Oral Daily  . levothyroxine  25 mcg Oral QAC breakfast  . polyethylene glycol  17 g Oral Daily  . senna-docusate  2 tablet Oral BID  . sodium chloride flush  3 mL Intravenous Q12H    Vital Signs    Filed Vitals:   09/07/15 1950 09/08/15 0431 09/08/15 0500 09/08/15 0725  BP: 108/67 98/61  103/64  Pulse: 130 83  94  Temp: 97.9 F (36.6 C) 97.8 F (36.6 C)    TempSrc: Oral Oral    Resp: 17 18  20   Height:      Weight:   197 lb 9.6 oz (89.631 kg) 200 lb 11.2 oz (91.037 kg)   SpO2: 99% 95%  98%    Intake/Output Summary (Last 24 hours) at 09/08/15 1019 Last data filed at 09/08/15 0742  Gross per 24 hour  Intake    120 ml  Output      0 ml  Net    120 ml   Filed Weights   09/07/15 0500 09/08/15 0431 09/08/15 0500  Weight: 198 lb 14.4 oz (90.22 kg) 197 lb 9.6 oz (89.631 kg) 200 lb 11.2 oz (91.037 kg)    Physical Exam    General: Well developed, well nourished, female appearing in no acute distress. Head: Normocephalic, atraumatic.  Neck: Supple without bruits, JVD not elevated. Lungs:  Resp regular and unlabored, decreased breath sounds at the bases bilaterally. No wheezing or rales appreciated. Heart: Irregularly irregular, S1, S2, no S3, S4, or murmur; no rub. Abdomen: Soft, non-tender, non-distended with normoactive bowel sounds. No hepatomegaly. No rebound/guarding. No obvious abdominal masses. Extremities: No clubbing or cyanosis, trace edema up to mid-shins. Distal pedal pulses are 2+ bilaterally. Neuro: Alert and oriented X 3. Moves all extremities spontaneously. Psych: Normal affect.  Labs    CBC  Recent Labs  09/05/15 1129 09/07/15 0730  WBC 11.8* 9.3  HGB 9.4* 10.0*  HCT 29.9* 31.2*  MCV 88.6 85.5  PLT 264 190    Telemetry    Atrial fibrillation, rate in 90's - low-100's. Occasional peaks into the 120's.   Cardiac Studies and Radiology    Ct Abdomen Pelvis Wo Contrast: 09/01/2015  CLINICAL DATA:  Free intraperitoneal air on KUB.  No abdominal pain EXAM: CT ABDOMEN AND PELVIS WITHOUT CONTRAST TECHNIQUE: Multidetector CT imaging of the abdomen and pelvis was performed following the standard protocol without IV contrast. COMPARISON:  09/01/2015 FINDINGS: Lower chest: Elevated right hemidiaphragm with right lower lobe atelectasis. No pleural effusion. Left lung clear. Mild cardiac enlargement. Coronary calcification. Hemodialysis catheter at the cavoatrial junction. Hepatobiliary:  Liver normal in size and contour without focal lesion. Gallbladder and bile ducts normal. Pancreas: No pancreatic mass or edema. There is free air in the peritoneal cavity as well as around the pancreas and in the mesentery. There is gas just above the pancreatic head. No definite duodenal edema to suggest perforated ulcer however this is in the differential. Spleen: Negative Adrenals/Urinary Tract: Multiple small nonobstructing bilateral renal calculi. 3.7 cm cyst left kidney posteriorly. Small cyst right kidney. No renal mass. Bilateral adrenal enlargement likely due to adrenal adenomata. Left adrenal nodule measures 19 x 29 mm. Urinary bladder normal. Stomach/Bowel: No oral contrast or IV contrast administered. The stomach is nondilated. There is no bowel edema although oral contrast would be helpful in evaluation of the bowel given the amount of free air. There is gas in the retroperitoneum near the pancreas as well as in the mesentery and in the peritoneal cavity. No bowel obstruction. Extensive pan diverticulosis of the colon. No evidence of diverticulitis. There is a large amount of stool in the colon including the rectum. There is a large amount of gas in the left mesentery medial to the left colon. This may be the area perforation, possibly due to perforated diverticulum. Vascular/Lymphatic: Negative for aortic aneurysm. No lymphadenopathy. Reproductive: Calcified uterine fibroid on the left. No pelvic mass. Other: No free fluid.  No abscess. Musculoskeletal: Mild compression fracture L1 of indeterminate age. Disc degeneration and spurring in the lumbar spine. Mild to moderate joint space narrowing in both hip joints. IMPRESSION: Pneumoperitoneum. Moderate amount of free air. There is also gas in the retroperitoneum around the pancreas and gas in the mesentery. Pneumoperitoneum may be related to perforated diverticulosis of the left colon. No evidence of duodenum edema to suggest perforated ulcer disease.  Surgical consultation recommended. Extensive stool throughout the colon. Critical Value/emergent results were called by telephone at the time of interpretation on 09/01/2015 at 6:55 pm to Dr. Vianne Bulls , who verbally acknowledged these results. Electronically Signed   By: Franchot Gallo M.D.   On: 09/01/2015 18:55   Dg Abd 1 View: 09/01/2015  CLINICAL DATA:  Constipation, abdominal pain EXAM: ABDOMEN - 1 VIEW COMPARISON:  None. FINDINGS: There appears to be free intraperitoneal air within the abdomen. I also suspect pneumatosis intestinalis of the left colon. Bowel gas pattern is nonobstructive. Large amount of stool is seen throughout the nondistended colon compatible with the given history of constipation. No soft tissue mass or abnormal fluid collection seen. Lung bases are clear. IMPRESSION: Findings highly suggestive of free intraperitoneal air within the abdomen and/or free retroperitoneal air. In the absence of a recent surgery, this almost certainly indicates bowel perforation. I also suspect pneumatosis intestinalis of the left colon which raises the possibility of associated bowel ischemia. Critical Value/emergent results were called by telephone at the time of  interpretation on 09/01/2015 at 4:30 pm to Dr. Ulice Bold MODY , who verbally acknowledged these results. Electronically Signed   By: Franki Cabot M.D.   On: 09/01/2015 16:35    Echocardiogram: 05/02/2015 Study Conclusions - Left ventricle: The cavity size was normal. There was mild  concentric hypertrophy. Systolic function was normal. The  estimated ejection fraction was in the range of 60% to 65%. Wall  motion was normal; there were no regional wall motion  abnormalities. The study is not technically sufficient to allow  evaluation of LV diastolic function. - Left atrium: The atrium was mildly dilated. - Right ventricle: Systolic function was normal. - Pulmonary arteries: Systolic pressure was mildly elevated PA peak  pressure: 41 mm Hg  (S).  Impressions: - Rhythm is atrial fibrillation.  Assessment & Plan    1. Acute on Chronic Diastolic CHF - Presented with worsening lower extremity edema and mild orthopnea and dyspnea.  HD initiated 3/6. - Volume management per Nephrology. Continue BB. No ACE-I due to ESRD. Appears to be doing much better on clinical exam  2. Chronic atrial fibrillation with variable rate control/RVR - History of chronic, rate-controlled AFib. She presented in atrial fibrillation with RVR, HR in the 140's. - Rates have been mostly controlled on BB and diltiazem throughout admission however, she had recurrence of rapid rates  able to be rate controlled in the past on Cardizem CD 120mg  daily and Metoprolol 150mg  BID. Unable to tolerate these medications secondary to hypotension now on dialysis  Currently on low-dose carvedilol, low-dose diltiazem, low-dose amiodarone --No room for additional medications secondary to low blood pressure  - This patients CHA2DS2-VASc Score and unadjusted Ischemic Stroke Rate (% per year) is equal to 7.2 % stroke rate/year from a score of 5 (CHF, HTN, Female, Age (2)). Eliquis has been restarted.  --- Would consider giving rate controlling medications the morning of dialysis with midodrine 10 mg up to 20 mg for blood pressure support (potentially could hold Cardizem, give carvedilol, amiodarone)  3. ESRD - Now on HD, s/p AVG placement 3/10 - Per nephrology.  4. HTN - Continue Cardizem 30mg  TID and Coreg  at current rate.   5. Perforated Viscus/Abdominal Free Air - Seen by surgery - conservative therapy as she is asymptomatic.   6. HLD - LDL 65 this admission. Not on statin at this time.  7. OSA  - On CPAP  8. Anemia of Chronic Kidney Disease  - Hgb stable   9. Elevated Troponin - troponin 0.08 on admission. Likely secondary to demand ischemia in the setting of CHF/AF RVR. Renal failure may also be playing a role. - Low-risk MV in 03/2015 (no  ischemia). No plan for further ischemic evaluation at this time.  Dispo Unable to advance medications for rate control other than what she is on at this time Would use midodrine as needed for blood pressure support before dialysis No further cardiac workup needed at this time  Signed  Esmond Plants  M.D., Ph.D. [facility]

## 2015-09-08 NOTE — Progress Notes (Signed)
Hemodialysis start 

## 2015-09-08 NOTE — Discharge Instructions (Signed)
Heart Failure Clinic appointment on September 05, 2015 at 12:30pm with Teresa Lutz, [place]. Please call [phone] to reschedule.   Heart healthy and renal diet.  Activity as tolerated.  HD per schedule  Midodrine 10 mg on dialysis days. Do not hold Cardizem and coreg prior to dialysis on HD days.

## 2015-09-08 NOTE — Progress Notes (Signed)
Patient discharged to [facility]. Patient /VS stable at discharge. All belonging and D/C packet are  Given to EMS.

## 2015-09-08 NOTE — Telephone Encounter (Signed)
Patient d/c from [facility] today had hd graft placed. [place] consulted for CHF.   Scheduled fu w/ Arida 09/15/15 at 10:30 am.

## 2015-09-08 NOTE — Telephone Encounter (Signed)
Patient daughter contacted regarding discharge from [facility] on 3/16.  Patient daughter understands to follow up with provider Fletcher Anon on March 23 at 10:30am at [facility]. Patient daughter understands discharge instructions? yes Patient daughter understands medications and regiment? yes Patient daughter understands to bring all medications to this visit? yes    Reviewed information with [name] (on DPR) who states pt will d/c to [facility]. She is unsure if they will transport to appointments. Rosamaria Lints to inquire at facility. She will call if pt unable to make appointment.

## 2015-09-08 NOTE — Progress Notes (Signed)
Post hd tx 

## 2015-09-08 NOTE — Progress Notes (Signed)
Patient is medically stable for D/C to [facility] today. [employer] authorization is pending. [facility] admissions coordinator at [facility] will accept a 5 day LOG. Zack clinical social work Surveyor, quantity approved 5 day LOG. Per Magda Paganini patient will go to room 501. RN will call report to 500 hall RN and arrange EMS for transport after patient has dialysis today at [facility]. CSW contacted patient's outpatient dialysis center [facility] on [address] and spoke to [place] and made her aware of above. CSW faxed D/C Summary to Debbie at dialysis center. CSW sent D/C Summary, FL2 and D/C packet to [facility] via [place]. Patient is aware of above. CSW contacted patient's daughter [name] and made her aware of above. CSW explained to daughter that if [facility] denies SNF then patient will have to discharge from [facility] after 5 days. Daughter verbalized her understanding. Please reconsult if future social work needs arise. CSW signing off.   Blima Rich, LCSW [phone]

## 2015-09-08 NOTE — Discharge Summary (Signed)
Teresa Lutz NAME: Teresa Lutz    MR#:  [id number]  DATE OF BIRTH:  [birth year]  DATE OF ADMISSION:  08/22/2015 ADMITTING PHYSICIAN: Demetrios Loll, MD  DATE OF DISCHARGE: No discharge date for patient encounter.  PRIMARY CARE PHYSICIAN: Casilda Carls, MD   ADMISSION DIAGNOSIS:  End stage renal disease ([place]) [N18.6] Generalized edema [R60.1] Atrial fibrillation with rapid ventricular response (HCC) [I48.91] Elevated troponin I level [R79.89]  DISCHARGE DIAGNOSIS:  Principal Problem:   Acute on chronic diastolic (congestive) heart failure (HCC) Active Problems:   Hyperlipidemia   Chronic atrial fibrillation (HCC)   Anemia of chronic disease   Atrial fibrillation with RVR (HCC)   Pressure ulcer   End stage renal disease (HCC)   Generalized edema   Hypoalbuminemia due to protein-calorie malnutrition (HCC)   Hypertensive heart disease   Atrial fibrillation with rapid ventricular response ([place])   Free intraperitoneal air   Perforated diverticulum of large intestine   Acute kidney failure, unspecified ([place])   SECONDARY DIAGNOSIS:   Past Medical History  Diagnosis Date  . Arthritis 1990  . Breast cancer ([place]) 2012    Right stage 1  . Ulcer 1980  . Malignant neoplasm of upper-inner quadrant of female breast ([place])     right  . Hemorrhoid   . Unspecified vitamin D deficiency   . Anemia of chronic disease   . Hypopotassemia   . Unspecified hypothyroidism   . Goiter, specified as simple   . Hyperlipidemia   . CKD (chronic kidney disease), stage IV ([place])   . Hypertension 1960  . Chronic atrial fibrillation ([place]) 2014    a. Eliquis 5 mg bid; CHADSVASc 5 (CHF, HTN, age x 2, female)  . Lower extremity edema   . Chronic diastolic CHF (congestive heart failure) (HCC)     a. EF 60-65% by echo in 04/2015  . Pulmonary HTN ([place])     a. PA peak pressure 41 mmHg by echo in 04/2015     ADMITTING HISTORY  Teresa Lutz  is a 81 y.o. female with a known history of CKD stage IV, A. Fib, chronic diastolic CHF and hypertension. The patient presented to the ED with above chief complaint. She has had a worsening leg edema for the past 3 days. She is taking Lasix without improvement. She denies any fever or chills, no chest pain or palpitation, no orthopnea or nocturnal dyspnea. ED physician discussed with on-call nephrologist Dr. Juleen China, who suggested that admitted patient for possible hemodialysis. Patient was found A. fib with RVR at 140 to 150s, given Cardizem 1 dose without improvement. So she is on Cardizem drip.  HOSPITAL COURSE:   81 year old African female history of atrial fibrillation, chronic kidney disease stage V, congestive heart failure ejection fraction 60-65 presented with worsening edema  *Acute on chronic diastolic congestive heart failure:due to worsening of renal function Now HD dependent Continue hemodialysis as scheduled by nephrology, T/T/S.  Resolved  * Perforated and contained diverticulitis: Patient has no symptoms or abdominal findings. Conservative management as per surgery. No acute indication for surgery. No tenderness  *Atrial fibrillation rapid ventricular response: Heart rates are not controlled. Amiodarone drip stopped and changed to oral amiodarone. Dose increased to 200 mg BID. Also on oral Cardizem and Coreg Discussed with Dr. Rockey Situ of cardiology on day of discharge Do not hold meds on day of dialysis. Midodrine added prior to HD to prevent any hypotension with HD.  * End stage  renal disease: s/p tunneled hemodialysis catheter via the right internal jugular vein by Dr Lucky Cowboy on 3/6 S/p left brachial axillary arteriovenous graft placement on 3/10. Outpatient hemodialysis is  set up Nephrology follow-up  *Urinary tract infection site unspecified due to klebsiella pneumoniae Finished Keflex  *Hypothyroidism Continue Synthroid  *Hypertension essential On Coreg and   diltiazem   *Venous thromboembolism prophylactic On Eliquis  * Anemia of chronic disease EPO  *Generalized weakness: continue PT.  *constipation Continue laxatives  Stable for discharge to SNF. Discussed with cardiology, SW and Daughter prior to discharge  CONSULTS OBTAINED:  Treatment Team:  Anthonette Legato, MD Lavonia Dana, MD Hubbard Robinson, MD Minna Merritts, MD  DRUG ALLERGIES:   Allergies  Allergen Reactions  . Celebrex [Celecoxib] Hives    DISCHARGE MEDICATIONS:   Current Discharge Medication List    START taking these medications   Details  amiodarone (PACERONE) 200 MG tablet Take 1 tablet (200 mg total) by mouth 2 (two) times daily.    carvedilol (COREG) 3.125 MG tablet Take 1 tablet (3.125 mg total) by mouth 2 (two) times daily with a meal.    diltiazem (CARDIZEM) 30 MG tablet Take 1 tablet (30 mg total) by mouth 3 (three) times daily.    guaiFENesin-codeine 100-10 MG/5ML syrup Take 5 mLs by mouth every 6 (six) hours as needed for cough. Qty: 120 mL, Refills: 0    midodrine (PROAMATINE) 10 MG tablet Take 1 tablet (10 mg total) by mouth 3 (three) times a week. On Tue-Thur-Sat prior to Dialysis    polyethylene glycol (MIRALAX / GLYCOLAX) packet Take 17 g by mouth daily. Qty: 14 each, Refills: 0    senna-docusate (SENOKOT-S) 8.6-50 MG tablet Take 2 tablets by mouth 2 (two) times daily.      CONTINUE these medications which have NOT CHANGED   Details  acetaminophen (TYLENOL) 325 MG tablet Take 325 mg by mouth every 6 (six) hours as needed for mild pain.    apixaban (ELIQUIS) 5 MG TABS tablet Take 1 tablet (5 mg total) by mouth 2 (two) times daily. Qty: 60 tablet, Refills: 3    BESIVANCE 0.6 % SUSP Place 1 drop into the left eye 4 (four) times daily. Pt only uses this medication for two days once a month.    calcitRIOL (ROCALTROL) 0.25 MCG capsule Take 0.25 mcg by mouth every Monday, Wednesday, and Friday.    ferrous sulfate 325 (65 FE) MG  tablet Take 325 mg by mouth daily with breakfast.    letrozole (FEMARA) 2.5 MG tablet Take 2.5 mg by mouth daily.     levothyroxine (SYNTHROID, LEVOTHROID) 25 MCG tablet Take 25 mcg by mouth daily before breakfast.       STOP taking these medications     diltiazem (CARDIZEM CD) 120 MG 24 hr capsule      metolazone (ZAROXOLYN) 5 MG tablet      metoprolol (LOPRESSOR) 100 MG tablet      olmesartan (BENICAR) 40 MG tablet      potassium chloride SA (K-DUR,KLOR-CON) 20 MEQ tablet      torsemide (DEMADEX) 20 MG tablet      Vitamin D, Ergocalciferol, (DRISDOL) 50000 units CAPS capsule         Today   VITAL SIGNS:  Blood pressure 103/64, pulse 94, temperature 97.8 F (36.6 C), temperature source Oral, resp. rate 20, height 5\' 7"  (1.702 m), weight 91.037 kg (200 lb 11.2 oz), SpO2 98 %.  I/O:   Intake/Output Summary (Last  24 hours) at 09/08/15 1040 Last data filed at 09/08/15 0742  Gross per 24 hour  Intake    120 ml  Output      0 ml  Net    120 ml    PHYSICAL EXAMINATION:  Physical Exam  GENERAL:  81 y.o.-year-old patient lying in the bed with no acute distress. Obese LUNGS: Normal breath sounds bilaterally, no wheezing, rales,rhonchi or crepitation. No use of accessory muscles of respiration.  CARDIOVASCULAR: S1, S2 irregular ABDOMEN: Soft, non-tender, non-distended. Bowel sounds present. No organomegaly or mass.  NEUROLOGIC: Moves all 4 extremities. PSYCHIATRIC: The patient is alert and oriented x 3.  SKIN: No obvious rash, lesion, or ulcer.   Permcath in place  DATA REVIEW:   CBC  Recent Labs Lab 09/07/15 0730  WBC 9.3  HGB 10.0*  HCT 31.2*  PLT 190    Chemistries   Recent Labs Lab 09/03/15 0548  NA 135  K 4.7  CL 95*  CO2 27  GLUCOSE 182*  BUN 73*  CREATININE 3.88*  CALCIUM 10.7*    Cardiac Enzymes No results for input(s): TROPONINI in the last 168 hours.  Microbiology Results  Results for orders placed or performed during the hospital  encounter of 08/22/15  MRSA PCR Screening     Status: None   Collection Time: 08/22/15  6:36 PM  Result Value Ref Range Status   MRSA by PCR NEGATIVE NEGATIVE Final    Comment:        The GeneXpert MRSA Assay (FDA approved for NASAL specimens only), is one component of a comprehensive MRSA colonization surveillance program. It is not intended to diagnose MRSA infection nor to guide or monitor treatment for MRSA infections.   Urine culture     Status: None   Collection Time: 08/24/15 11:18 AM  Result Value Ref Range Status   Specimen Description URINE, RANDOM  Final   Special Requests NONE  Final   Culture >=100,000 COLONIES/mL KLEBSIELLA PNEUMONIAE  Final   Report Status 08/27/2015 FINAL  Final   Organism ID, Bacteria KLEBSIELLA PNEUMONIAE  Final      Susceptibility   Klebsiella pneumoniae - MIC*    AMPICILLIN RESISTANT Resistant     CEFAZOLIN <=4 SENSITIVE Sensitive     CEFTRIAXONE <=1 SENSITIVE Sensitive     CIPROFLOXACIN <=0.25 SENSITIVE Sensitive     GENTAMICIN <=1 SENSITIVE Sensitive     IMIPENEM <=0.25 SENSITIVE Sensitive     NITROFURANTOIN 64 INTERMEDIATE Intermediate     TRIMETH/SULFA <=20 SENSITIVE Sensitive     AMPICILLIN/SULBACTAM <=2 SENSITIVE Sensitive     PIP/TAZO <=4 SENSITIVE Sensitive     Extended ESBL NEGATIVE Sensitive     * >=100,000 COLONIES/mL KLEBSIELLA PNEUMONIAE    RADIOLOGY:  No results found.  Follow up with PCP in 1 week.  Management plans discussed with the patient, family and they are in agreement.  CODE STATUS:     Code Status Orders        Start     Ordered   08/22/15 1838  Full code   Continuous     08/22/15 1837    Code Status History    Date Active Date Inactive Code Status Order ID Comments User Context   This patient has a current code status but no historical code status.    Advance Directive Documentation        Most Recent Value   Type of Advance Directive  Living will   Pre-existing out of facility DNR order  (  yellow form or pink MOST form)     "MOST" Form in Place?        TOTAL TIME TAKING CARE OF THIS PATIENT ON DAY OF DISCHARGE: more than 30 minutes.   Hillary Bow R M.D on 09/08/2015 at 10:40 AM  Between 7am to 6pm - Pager - [phone]  After 6pm go to [email or web address] - password [email or web address] [facility]  Office  [phone]  CC: Primary care physician; Casilda Carls, MD  Note: This dictation was prepared with Dragon dictation along with smaller phrase technology. Any transcriptional errors that result from this process are unintentional.

## 2015-09-08 NOTE — Progress Notes (Signed)
Hemodialysis completed. 

## 2015-09-08 NOTE — Progress Notes (Signed)
[facility]  ROUNDING NOTE   Subjective:   Talks very little Denies acute c./o Appetite remains poor   Objective:  Vital signs in last 24 hours:  Temp:  [97.6 F (36.4 C)-97.9 F (36.6 C)] 97.6 F (36.4 C) (03/16 1118) Pulse Rate:  [67-130] 67 (03/16 1118) Resp:  [17-20] 18 (03/16 1118) BP: (98-111)/(46-67) 111/46 mmHg (03/16 1118) SpO2:  [95 %-99 %] 97 % (03/16 1118) Weight:  [89.631 kg (197 lb 9.6 oz)-91.037 kg (200 lb 11.2 oz)] 91.037 kg (200 lb 11.2 oz) (03/16 0500)  Weight change: 0.131 kg (4.6 oz) Filed Weights   09/07/15 0500 09/08/15 0431 09/08/15 0500  Weight: 90.22 kg (198 lb 14.4 oz) 89.631 kg (197 lb 9.6 oz) 91.037 kg (200 lb 11.2 oz)    Intake/Output: I/O last 3 completed shifts: In: 60 [P.O.:120; I.V.:3] Out: -    Intake/Output this shift:  Total I/O In: 120 [P.O.:120] Out: -   Physical Exam: General: NAD, resting in bed  Head: Moist oral mucosal membranes  Eyes: Anicteric   Neck: Supple, trachea midline  Lungs:  clear  Heart: irregular  Abdomen:  Soft, nontender, BS presemt   Extremities: 2+ peripheral edema in all 4 extremities  Neurologic: Nonfocal, moving all four extremities. Able to follow commands  Skin: No lesions  Access:  RIJ permcath 3/6 Dr. Lucky Cowboy, left arm AVF 3/10 Dr. Delana Meyer.     Basic Metabolic Panel:  Recent Labs Lab 09/02/15 0409 09/03/15 0548  NA 137 135  K 4.1 4.7  CL 98* 95*  CO2 28 27  GLUCOSE 192* 182*  BUN 50* 73*  CREATININE 2.79* 3.88*  CALCIUM 10.4* 10.7*    Liver Function Tests: No results for input(s): AST, ALT, ALKPHOS, BILITOT, PROT, ALBUMIN in the last 168 hours. No results for input(s): LIPASE, AMYLASE in the last 168 hours. No results for input(s): AMMONIA in the last 168 hours.  CBC:  Recent Labs Lab 09/02/15 0409 09/03/15 0548 09/05/15 1129 09/07/15 0730  WBC 13.9* 13.6* 11.8* 9.3  HGB 9.6* 9.7* 9.4* 10.0*  HCT 29.8* 30.2* 29.9* 31.2*  MCV 86.7 88.8 88.6 85.5  PLT 340 354  264 190    Cardiac Enzymes: No results for input(s): CKTOTAL, CKMB, CKMBINDEX, TROPONINI in the last 168 hours.  BNP: Invalid input(s): POCBNP  CBG:  Recent Labs Lab 09/07/15 1150 09/07/15 1719 09/07/15 2111 09/08/15 0726 09/08/15 1126  GLUCAP 94 102* 91 117* 170*    Microbiology: Results for orders placed or performed during the hospital encounter of 08/22/15  MRSA PCR Screening     Status: None   Collection Time: 08/22/15  6:36 PM  Result Value Ref Range Status   MRSA by PCR NEGATIVE NEGATIVE Final    Comment:        The GeneXpert MRSA Assay (FDA approved for NASAL specimens only), is one component of a comprehensive MRSA colonization surveillance program. It is not intended to diagnose MRSA infection nor to guide or monitor treatment for MRSA infections.   Urine culture     Status: None   Collection Time: 08/24/15 11:18 AM  Result Value Ref Range Status   Specimen Description URINE, RANDOM  Final   Special Requests NONE  Final   Culture >=100,000 COLONIES/mL KLEBSIELLA PNEUMONIAE  Final   Report Status 08/27/2015 FINAL  Final   Organism ID, Bacteria KLEBSIELLA PNEUMONIAE  Final      Susceptibility   Klebsiella pneumoniae - MIC*    AMPICILLIN RESISTANT Resistant  CEFAZOLIN <=4 SENSITIVE Sensitive     CEFTRIAXONE <=1 SENSITIVE Sensitive     CIPROFLOXACIN <=0.25 SENSITIVE Sensitive     GENTAMICIN <=1 SENSITIVE Sensitive     IMIPENEM <=0.25 SENSITIVE Sensitive     NITROFURANTOIN 64 INTERMEDIATE Intermediate     TRIMETH/SULFA <=20 SENSITIVE Sensitive     AMPICILLIN/SULBACTAM <=2 SENSITIVE Sensitive     PIP/TAZO <=4 SENSITIVE Sensitive     Extended ESBL NEGATIVE Sensitive     * >=100,000 COLONIES/mL KLEBSIELLA PNEUMONIAE    Coagulation Studies: No results for input(s): LABPROT, INR in the last 72 hours.  Urinalysis: No results for input(s): COLORURINE, LABSPEC, PHURINE, GLUCOSEU, HGBUR, BILIRUBINUR, KETONESUR, PROTEINUR, UROBILINOGEN, NITRITE,  LEUKOCYTESUR in the last 72 hours.  Invalid input(s): APPERANCEUR    Imaging: No results found.   Medications:     . amiodarone  200 mg Oral BID  . apixaban  5 mg Oral BID  . carvedilol  3.125 mg Oral Q2200  . diltiazem  30 mg Oral TID  . epoetin (EPOGEN/PROCRIT) injection  10,000 Units Intravenous Q T,Th,Sa-HD  . ferrous sulfate  325 mg Oral Q breakfast  . insulin aspart  0-9 Units Subcutaneous TID WC  . letrozole  2.5 mg Oral Daily  . levothyroxine  25 mcg Oral QAC breakfast  . midodrine  10 mg Oral Once per day on Tue Thu Sat  . polyethylene glycol  17 g Oral Daily  . senna-docusate  2 tablet Oral BID  . sodium chloride flush  3 mL Intravenous Q12H   sodium chloride, acetaminophen **OR** acetaminophen, albuterol, hydrALAZINE, menthol-cetylpyridinium, ondansetron **OR** ondansetron (ZOFRAN) IV, oxyCODONE-acetaminophen, sodium chloride flush, sodium phosphate  Assessment/ Plan:  81 y.o. female with atrial fibrillation, anemia, coronary artery disease, diabetes mellitus type 2, hypertension, hyperlipidemia, history of breast cancer, CKD stage IV/V who presents with increasing shortness of breath and edema.  1. End stage renal disease: first dialysis on 08/29/15. RIJ permcath. AVF placed 3/10 by Dr. Delana Meyer  - following TTS schedule in hospital -  discharge planning. [place] follow with [facility] -  Patient will be starting at [facility] on Saturday per  discharge planning  2. Hypertension and Diastolic Congestive Heart Failure with lower extremity edema and anasarca. Also with atrial fibrillation with RVR. Echocardiogram from 05/02/15.  Blood pressure at goal. - Ultrafiltration with dialysis  - Diltiazem and metoprolol - consider changing metoprolol to Coreg (once daily at night) as it is not dialyzed off  3. Anemia of chronic kidney disease: hemoglobin 9.4 - EPO with HD treatment.   4. Secondary hyperparathyroidism: with hypercalcemia: phosphorus at goal. PTH 378 holding  calcitriol due to hypercalcemia.  - will look into cinacalcet as outpatient.    LOS: [place] 3/16/201712:19 PM

## 2015-09-08 NOTE — Clinical Social Work Placement (Signed)
   CLINICAL SOCIAL WORK PLACEMENT  NOTE  Date:  09/08/2015  Patient Details  Name: Teresa Lutz MRN: [id number] Date of Birth: 11-16-1934  Clinical Social Work is seeking post-discharge placement for this patient at the [place] level of care (*CSW will initial, date and re-position this form in  chart as items are completed):  Yes   Patient/family provided with [place] Work Department's list of facilities offering this level of care within the geographic area requested by the patient (or if unable, by the patient's family).  Yes   Patient/family informed of their freedom to choose among providers that offer the needed level of care, that participate in Medicare, Medicaid or managed care program needed by the patient, have an available bed and are willing to accept the patient.  Yes   Patient/family informed of [facility]'s ownership interest in [facility] and [facility], as well as of the fact that they are under no obligation to receive care at these facilities.  PASRR submitted to EDS on 08/24/15     PASRR number received on 08/24/15     Existing PASRR number confirmed on       FL2 transmitted to all facilities in geographic area requested by pt/family on 08/24/15     FL2 transmitted to all facilities within larger geographic area on       Patient informed that his/her managed care company has contracts with or will negotiate with certain facilities, including the following:        Yes   Patient/family informed of bed offers received.  Patient chooses bed at  [facility] )     Physician recommends and patient chooses bed at      Patient to be transferred to  [facility] ) on 09/08/15.  Patient to be transferred to facility by  [facility] )     Patient family notified on 09/08/15 of transfer.  Name of family member notified:   (Patient's daughter [name] is aware of D/C today. )     PHYSICIAN        Additional Comment:    _______________________________________________ Loralyn Freshwater, LCSW 09/08/2015, 11:54 AM

## 2015-09-08 NOTE — Progress Notes (Signed)
Pre-hd tx 

## 2015-09-15 ENCOUNTER — Encounter: Payer: Medicare HMO | Admitting: Cardiovascular Disease

## 2015-09-26 ENCOUNTER — Encounter (INDEPENDENT_AMBULATORY_CARE_PROVIDER_SITE_OTHER): Payer: Medicare HMO | Admitting: Ophthalmology

## 2015-09-28 ENCOUNTER — Other Ambulatory Visit: Payer: Self-pay | Admitting: Oncology

## 2015-09-28 DIAGNOSIS — D638 Anemia in other chronic diseases classified elsewhere: Secondary | ICD-10-CM

## 2015-09-29 ENCOUNTER — Inpatient Hospital Stay: Payer: Medicare HMO | Admitting: Oncology

## 2015-09-29 ENCOUNTER — Inpatient Hospital Stay: Payer: Medicare HMO | Attending: Oncology

## 2015-09-29 ENCOUNTER — Inpatient Hospital Stay: Payer: Medicare HMO

## 2015-10-13 ENCOUNTER — Encounter: Payer: Medicare HMO | Admitting: Internal Medicine

## 2015-10-13 NOTE — Progress Notes (Signed)
* [facility]     Assessment and Plan:  Dyspnea. -This is likely multifactorial from severe deconditioning, debility, volume overload, muscle weakness. -Appearance she does not appear to have any evidence of primary pulmonary disease -She does have an elevated right diaphragm which may be contributory.  Obstructive sleep apnea. -The patient has not been using VPAP as recommended, she said her machine is broken. -We will attempt to see if we can get her machine repaired or replaced.  Debility and deconditioning. -The patient has severe debility and deconditioning, she is able unable to stand up from a seated position. -She appeared to be very weak. I did discuss with her and her daughter that I'm not sure that she can live at home much longer, and I think she is at high risk of having falls.  -I also did this attempt to have a discussion regarding end-of-life, but didn't. She does not appear to be ready to have this discussion at this time. I also did attempt to have discussions regarding change of living situation as I do not think she is capable living by herself anymore and is at high risk of having an accident, she did not wish to discuss that at this time.  Chronic venous stasis and volume overload. -This is likely contributing to her dyspnea. This is likely due to chronic diastolic CHF as well as chronic kidney disease.  Atrial fibrillation. -Currently on Eliquis  Chronic kidney disease.    Date: 10/13/2015  MRN# [id number] Teresa Lutz Feb 10, 1935   Teresa Lutz is a 81 y.o. old female seen in follow up for chief complaint of  No chief complaint on file.    HPI:   The patient is a 81 year old female with a history of atrial fibrillation, type 2 diabetes mellitus, hypertension, chronic kidney disease, sleep apnea, chronic leg edema, atrial fibrillation. At last visit she was noted that she had a history of sleep apnea, on VPAP. However, it was noted  that she was not using the mask sufficiently, she is also noted to use 1 L of oxygen at night with Pap. Last visit it was noted that the her machine was currently not working properly. She was therefore referred back to her homecare company to see if we could get it replaced or repaired. She was noted to have chronic venous stasis, which is likely contributing to her dyspnea.  She was advised to use her VPAP every night for the entire night. She was also noted that she was very debilitated, she was therefore referred for physical therapy. She is subsequently undergone a SPECT scan which showed an EF of 28%.    Most recent echocardiogram in 08/19/2014 showed an ejection fraction of 50-55%, mild mitral regurgitation, mildly dilated right and left atrium, moderate tricuspid regurgitation and pulmonary artery systolic pressure was 49 mmHg.  Chest x-ray images from 08/01/15, reviewed: This shows an elevated right-sided diaphragm, with strandy right sided atelectasis. Otherwise unremarkable.  Nuclear SPECT scan 04/01/2015: EF of 28%.  Medication:   Outpatient Encounter Prescriptions as of 10/13/2015  Medication Sig  . acetaminophen (TYLENOL) 325 MG tablet Take 325 mg by mouth every 6 (six) hours as needed for mild pain.  [name] amiodarone (PACERONE) 200 MG tablet Take 1 tablet (200 mg total) by mouth 2 (two) times daily.  [name] apixaban (ELIQUIS) 5 MG TABS tablet Take 1 tablet (5 mg total) by mouth 2 (two) times daily.  [name] BESIVANCE 0.6 % SUSP Place 1 drop into the left  eye 4 (four) times daily. Pt only uses this medication for two days once a month.  . calcitRIOL (ROCALTROL) 0.25 MCG capsule Take 0.25 mcg by mouth every Monday, Wednesday, and Friday.  . carvedilol (COREG) 3.125 MG tablet Take 1 tablet (3.125 mg total) by mouth 2 (two) times daily with a meal.  . diltiazem (CARDIZEM) 30 MG tablet Take 1 tablet (30 mg total) by mouth 3 (three) times daily.  . ferrous sulfate 325 (65 FE) MG tablet Take 325 mg by  mouth daily with breakfast.  . guaiFENesin-codeine 100-10 MG/5ML syrup Take 5 mLs by mouth every 6 (six) hours as needed for cough.  . letrozole (FEMARA) 2.5 MG tablet Take 2.5 mg by mouth daily.   [name] levothyroxine (SYNTHROID, LEVOTHROID) 25 MCG tablet Take 25 mcg by mouth daily before breakfast.   . midodrine (PROAMATINE) 10 MG tablet Take 1 tablet (10 mg total) by mouth 3 (three) times a week. On Tue-Thur-Sat prior to Dialysis  . polyethylene glycol (MIRALAX / GLYCOLAX) packet Take 17 g by mouth daily.  [name] senna-docusate (SENOKOT-S) 8.6-50 MG tablet Take 2 tablets by mouth 2 (two) times daily.   No facility-administered encounter medications on file as of 10/13/2015.     Allergies:  Celebrex  Review of Systems: Gen:  Denies  fever, sweats. HEENT: Denies blurred vision. Cvc:  No dizziness, chest pain or heaviness Resp:   Denies cough or sputum porduction. Gi: Denies swallowing difficulty, stomach pain. constipation, bowel incontinence Gu:  Denies bladder incontinence, burning urine Ext:   No Joint pain, stiffness. Skin: No skin rash, easy bruising. Endoc:  No polyuria, polydipsia. Psych: No depression, insomnia. Other:  All other systems were reviewed and found to be negative other than what is mentioned in the HPI.   Physical Examination:   VS: There were no vitals taken for this visit.  General Appearance: No distress  Neuro:without focal findings,  speech normal,  HEENT: PERRLA, EOM intact. Pulmonary: normal breath sounds, No wheezing.   CardiovascularNormal S1,S2.  No m/r/g.   Abdomen: Benign, Soft, non-tender. Renal:  No costovertebral tenderness  GU:  Not performed at this time. Endoc: No evident thyromegaly, no signs of acromegaly. Skin:   warm, no rash. Extremities: normal, no cyanosis, clubbing.   LABORATORY PANEL:   CBC No results for input(s): WBC, HGB, HCT, PLT in the last 168  hours. ------------------------------------------------------------------------------------------------------------------  Chemistries  No results for input(s): NA, K, CL, CO2, GLUCOSE, BUN, CREATININE, CALCIUM, MG, AST, ALT, ALKPHOS, BILITOT in the last 168 hours.  Invalid input(s): GFRCGP ------------------------------------------------------------------------------------------------------------------  Cardiac Enzymes No results for input(s): TROPONINI in the last 168 hours. ------------------------------------------------------------  RADIOLOGY:   No results found for this or any previous visit. Results for orders placed during the hospital encounter of 01/23/08  DG Chest 2 View   Narrative Clinical Data: Preop workup for surgery.  Hypertension.   CHEST - 2 VIEW   Comparison: None   Findings: The heart size and mediastinal contours are within normal limits.  Both lungs are clear.  Mild degenerative spondylosis of the thoracic spine.   IMPRESSION: No active disease.  Provider: Mila Palmer   ------------------------------------------------------------------------------------------------------------------  Thank  you for allowing [facility] to assist in the care of your patient. Our recommendations are noted above.  Please contact us if we can be of further service.   Marda Stalker, MD.  [facility] Office Number: [phone]  Patricia Pesa, M.D.  Vilinda Boehringer, M.D.  Merton Border, M.D  This encounter was created in error - please disregard.

## 2015-10-14 ENCOUNTER — Encounter: Payer: Self-pay | Admitting: *Deleted

## 2015-11-24 ENCOUNTER — Encounter: Payer: Self-pay | Admitting: Vascular Surgery

## 2015-12-16 ENCOUNTER — Ambulatory Visit: Payer: Medicare HMO | Admitting: Cardiovascular Disease

## 2015-12-16 ENCOUNTER — Encounter: Payer: Medicare HMO | Attending: Surgery | Admitting: Surgery

## 2015-12-16 DIAGNOSIS — I4891 Unspecified atrial fibrillation: Secondary | ICD-10-CM | POA: Insufficient documentation

## 2015-12-16 DIAGNOSIS — I890 Lymphedema, not elsewhere classified: Secondary | ICD-10-CM | POA: Diagnosis not present

## 2015-12-16 DIAGNOSIS — I2510 Atherosclerotic heart disease of native coronary artery without angina pectoris: Secondary | ICD-10-CM | POA: Insufficient documentation

## 2015-12-16 DIAGNOSIS — D631 Anemia in chronic kidney disease: Secondary | ICD-10-CM | POA: Insufficient documentation

## 2015-12-16 DIAGNOSIS — I132 Hypertensive heart and chronic kidney disease with heart failure and with stage 5 chronic kidney disease, or end stage renal disease: Secondary | ICD-10-CM | POA: Diagnosis not present

## 2015-12-16 DIAGNOSIS — E1122 Type 2 diabetes mellitus with diabetic chronic kidney disease: Secondary | ICD-10-CM | POA: Insufficient documentation

## 2015-12-16 DIAGNOSIS — L89311 Pressure ulcer of right buttock, stage 1: Secondary | ICD-10-CM | POA: Diagnosis not present

## 2015-12-16 DIAGNOSIS — N186 End stage renal disease: Secondary | ICD-10-CM | POA: Diagnosis not present

## 2015-12-16 DIAGNOSIS — I509 Heart failure, unspecified: Secondary | ICD-10-CM | POA: Diagnosis not present

## 2015-12-16 DIAGNOSIS — Z79899 Other long term (current) drug therapy: Secondary | ICD-10-CM | POA: Insufficient documentation

## 2015-12-16 DIAGNOSIS — Z7901 Long term (current) use of anticoagulants: Secondary | ICD-10-CM | POA: Insufficient documentation

## 2015-12-16 DIAGNOSIS — E11622 Type 2 diabetes mellitus with other skin ulcer: Secondary | ICD-10-CM | POA: Diagnosis present

## 2016-01-16 ENCOUNTER — Ambulatory Visit: Payer: Medicare HMO | Admitting: Cardiovascular Disease

## 2016-01-24 ENCOUNTER — Other Ambulatory Visit: Payer: Self-pay | Admitting: Vascular Surgery

## 2016-01-30 ENCOUNTER — Ambulatory Visit: Admission: RE | Admit: 2016-01-30 | Payer: Medicare HMO | Source: Ambulatory Visit | Admitting: Vascular Surgery

## 2016-01-30 SURGERY — DIALYSIS/PERMA CATHETER REMOVAL
Anesthesia: Moderate Sedation

## 2016-02-02 ENCOUNTER — Encounter: Payer: Self-pay | Admitting: *Deleted

## 2016-02-08 ENCOUNTER — Ambulatory Visit: Payer: Medicare HMO | Admitting: General Surgery

## 2016-02-14 ENCOUNTER — Other Ambulatory Visit: Payer: Self-pay | Admitting: Vascular Surgery

## 2016-02-20 ENCOUNTER — Ambulatory Visit
Admission: RE | Admit: 2016-02-20 | Discharge: 2016-02-20 | Disposition: A | Discharge status: Home / Self Care | Payer: Medicare HMO | Source: Ambulatory Visit | Attending: Vascular Surgery | Admitting: Vascular Surgery

## 2016-02-20 ENCOUNTER — Encounter
Admission: RE | Disposition: A | Discharge status: Home / Self Care | Payer: Self-pay | Source: Ambulatory Visit | Attending: Vascular Surgery

## 2016-02-20 DIAGNOSIS — Z823 Family history of stroke: Secondary | ICD-10-CM | POA: Diagnosis not present

## 2016-02-20 DIAGNOSIS — Z8041 Family history of malignant neoplasm of ovary: Secondary | ICD-10-CM | POA: Insufficient documentation

## 2016-02-20 DIAGNOSIS — D631 Anemia in chronic kidney disease: Secondary | ICD-10-CM | POA: Diagnosis not present

## 2016-02-20 DIAGNOSIS — I482 Chronic atrial fibrillation: Secondary | ICD-10-CM | POA: Insufficient documentation

## 2016-02-20 DIAGNOSIS — I132 Hypertensive heart and chronic kidney disease with heart failure and with stage 5 chronic kidney disease, or end stage renal disease: Secondary | ICD-10-CM | POA: Insufficient documentation

## 2016-02-20 DIAGNOSIS — Z79899 Other long term (current) drug therapy: Secondary | ICD-10-CM | POA: Insufficient documentation

## 2016-02-20 DIAGNOSIS — Z800 Family history of malignant neoplasm of digestive organs: Secondary | ICD-10-CM | POA: Diagnosis not present

## 2016-02-20 DIAGNOSIS — Z992 Dependence on renal dialysis: Secondary | ICD-10-CM | POA: Diagnosis not present

## 2016-02-20 DIAGNOSIS — E876 Hypokalemia: Secondary | ICD-10-CM | POA: Diagnosis not present

## 2016-02-20 DIAGNOSIS — Z7901 Long term (current) use of anticoagulants: Secondary | ICD-10-CM | POA: Diagnosis not present

## 2016-02-20 DIAGNOSIS — Z853 Personal history of malignant neoplasm of breast: Secondary | ICD-10-CM | POA: Insufficient documentation

## 2016-02-20 DIAGNOSIS — R609 Edema, unspecified: Secondary | ICD-10-CM | POA: Diagnosis not present

## 2016-02-20 DIAGNOSIS — E785 Hyperlipidemia, unspecified: Secondary | ICD-10-CM | POA: Insufficient documentation

## 2016-02-20 DIAGNOSIS — Z888 Allergy status to other drugs, medicaments and biological substances status: Secondary | ICD-10-CM | POA: Diagnosis not present

## 2016-02-20 DIAGNOSIS — M1990 Unspecified osteoarthritis, unspecified site: Secondary | ICD-10-CM | POA: Insufficient documentation

## 2016-02-20 DIAGNOSIS — I5032 Chronic diastolic (congestive) heart failure: Secondary | ICD-10-CM | POA: Diagnosis not present

## 2016-02-20 DIAGNOSIS — Z8249 Family history of ischemic heart disease and other diseases of the circulatory system: Secondary | ICD-10-CM | POA: Insufficient documentation

## 2016-02-20 DIAGNOSIS — N186 End stage renal disease: Secondary | ICD-10-CM | POA: Diagnosis not present

## 2016-02-20 DIAGNOSIS — E049 Nontoxic goiter, unspecified: Secondary | ICD-10-CM | POA: Insufficient documentation

## 2016-02-20 DIAGNOSIS — E039 Hypothyroidism, unspecified: Secondary | ICD-10-CM | POA: Diagnosis not present

## 2016-02-20 DIAGNOSIS — E559 Vitamin D deficiency, unspecified: Secondary | ICD-10-CM | POA: Diagnosis not present

## 2016-02-20 DIAGNOSIS — I272 Other secondary pulmonary hypertension: Secondary | ICD-10-CM | POA: Diagnosis not present

## 2016-02-20 DIAGNOSIS — Z4901 Encounter for fitting and adjustment of extracorporeal dialysis catheter: Secondary | ICD-10-CM | POA: Insufficient documentation

## 2016-02-20 HISTORY — PX: PERIPHERAL VASCULAR CATHETERIZATION: SHX172C

## 2016-02-20 SURGERY — DIALYSIS/PERMA CATHETER REMOVAL
Anesthesia: Moderate Sedation

## 2016-02-20 SURGICAL SUPPLY — 1 items: TRAY LACERAT/PLASTIC (MISCELLANEOUS) ×3 IMPLANT

## 2016-02-20 NOTE — H&P (Signed)
[facility] Admission History & Physical  MRN : [id number]  Teresa Lutz is a 81 y.o. ([birth year]) female who presents with chief complaint of No chief complaint on file. Teresa Lutz  History of Present Illness: Patient presents for removal of her dialysis catheter.  She reports that her permanent dialysis access is working well, and they have not had to use the catheter and a couple of weeks. She reports no fevers or chills or signs of infection. Her dialysis treatments seen to be going well. She has no specific complaints today.  No current facility-administered medications for this encounter.    Current Outpatient Prescriptions  Medication Sig Dispense Refill  . acetaminophen (TYLENOL) 325 MG tablet Take 325 mg by mouth every 6 (six) hours as needed for mild pain.    Teresa Lutz amiodarone (PACERONE) 200 MG tablet Take 1 tablet (200 mg total) by mouth 2 (two) times daily.    Teresa Lutz apixaban (ELIQUIS) 5 MG TABS tablet Take 1 tablet (5 mg total) by mouth 2 (two) times daily. 60 tablet 3  . BESIVANCE 0.6 % SUSP Place 1 drop into the left eye 4 (four) times daily. Pt only uses this medication for two days once a month.    . calcitRIOL (ROCALTROL) 0.25 MCG capsule Take 0.25 mcg by mouth every Monday, Wednesday, and Friday.    . carvedilol (COREG) 3.125 MG tablet Take 1 tablet (3.125 mg total) by mouth 2 (two) times daily with a meal.    . diltiazem (CARDIZEM) 30 MG tablet Take 1 tablet (30 mg total) by mouth 3 (three) times daily.    . ferrous sulfate 325 (65 FE) MG tablet Take 325 mg by mouth daily with breakfast.    . guaiFENesin-codeine 100-10 MG/5ML syrup Take 5 mLs by mouth every 6 (six) hours as needed for cough. 120 mL 0  . letrozole (FEMARA) 2.5 MG tablet Take 2.5 mg by mouth daily.     Teresa Lutz levothyroxine (SYNTHROID, LEVOTHROID) 25 MCG tablet Take 25 mcg by mouth daily before breakfast.     . midodrine (PROAMATINE) 10 MG tablet Take 1 tablet (10 mg total) by mouth 3 (three) times a  week. On Tue-Thur-Sat prior to Dialysis    . polyethylene glycol (MIRALAX / GLYCOLAX) packet Take 17 g by mouth daily. 14 each 0  . senna-docusate (SENOKOT-S) 8.6-50 MG tablet Take 2 tablets by mouth 2 (two) times daily.      Past Medical History:  Diagnosis Date  . Anemia of chronic disease   . Arthritis 1990  . Breast cancer ([place]) 2012   Right stage 1  . Chronic atrial fibrillation ([place]) 2014   a. Eliquis 5 mg bid; CHADSVASc 5 (CHF, HTN, age x 2, female)  . Chronic diastolic CHF (congestive heart failure) (HCC)    a. EF 60-65% by echo in 04/2015  . CKD (chronic kidney disease), stage IV ([place])   . Goiter, specified as simple   . Hemorrhoid   . Hyperlipidemia   . Hypertension 1960  . Hypopotassemia   . Lower extremity edema   . Malignant neoplasm of upper-inner quadrant of female breast ([place])    right  . Pulmonary HTN ([place])    a. PA peak pressure 41 mmHg by echo in 04/2015  . Ulcer 1980  . Unspecified hypothyroidism   . Unspecified vitamin D deficiency     Past Surgical History:  Procedure Laterality Date  . AV FISTULA PLACEMENT Left 09/02/2015   Procedure: INSERTION OF ARTERIOVENOUS (AV)  GORE-TEX GRAFT ARM;  Surgeon: Katha Cabal, MD;  Location: [facility] ORS;  Service: Vascular;  Laterality: Left;  . BREAST LUMPECTOMY Right 50 years ago  . BREAST LUMPECTOMY Right 2012   invasive   . CARDIAC CATHETERIZATION  06/2010   [facility]: Mild nonobstructive coronary artery disease with normal ejection fraction.  Teresa Lutz CARDIOVERSION    . COLONOSCOPY  2011  . mammosite balloon placement  Right 11/2010  . PERIPHERAL VASCULAR CATHETERIZATION N/A 08/29/2015   Procedure: Dialysis/Perma Catheter Insertion;  Surgeon: Algernon Huxley, MD;  Location: [facility];  Service: Cardiovascular;  Laterality: N/A;    Social History Social History  Substance Use Topics  . Smoking status: Never Smoker  . Smokeless tobacco: Never Used  . Alcohol use No  No IV drug use  Family History Family  History  Problem Relation Age of Onset  . Cancer Mother     ovarian  . Hypertension Mother   . Cancer Brother     colon  . Stroke Father     Allergies  Allergen Reactions  . Celebrex [Celecoxib] Hives     REVIEW OF SYSTEMS (Negative unless checked)  Constitutional: [] Weight loss  [] Fever  [] Chills Cardiac: [] Chest pain   [] Chest pressure   [] Palpitations   [] Shortness of breath when laying flat   [] Shortness of breath at rest   [] Shortness of breath with exertion. Vascular:  [] Pain in legs with walking   [] Pain in legs at rest   [] Pain in legs when laying flat   [] Claudication   [] Pain in feet when walking  [] Pain in feet at rest  [] Pain in feet when laying flat   [] History of DVT   [] Phlebitis   [x] Swelling in legs   [] Varicose veins   [] Non-healing ulcers Pulmonary:   [] Uses home oxygen   [] Productive cough   [] Hemoptysis   [] Wheeze  [] COPD   [] Asthma Neurologic:  [] Dizziness  [] Blackouts   [] Seizures   [] History of stroke   [] History of TIA  [] Aphasia   [] Temporary blindness   [] Dysphagia   [] Weakness or numbness in arms   [] Weakness or numbness in legs Musculoskeletal:  [] Arthritis   [] Joint swelling   [] Joint pain   [] Low back pain Hematologic:  [] Easy bruising  [] Easy bleeding   [] Hypercoagulable state   [] Anemic  [] Hepatitis Gastrointestinal:  [] Blood in stool   [] Vomiting blood  [] Gastroesophageal reflux/heartburn   [] Difficulty swallowing. Genitourinary:  [x] Chronic kidney disease   [] Difficult urination  [] Frequent urination  [] Burning with urination   [] Blood in urine Skin:  [] Rashes   [] Ulcers   [] Wounds Psychological:  [] History of anxiety   []  History of major depression.  Physical Examination  There were no vitals filed for this visit. There is no height or weight on file to calculate BMI. Gen: WD/WN, NAD Head: [place]/AT, No temporalis wasting. Prominent temp pulse not noted. Ear/Nose/Throat: Hearing grossly intact, nares w/o erythema or drainage, oropharynx w/o  Erythema/Exudate,  Eyes: PERRLA, EOMI.  Neck: Supple, no nuchal rigidity.  No JVD.  Pulmonary:  Good air movement, no use of accessory muscles.  Cardiac: RRR, normal S1, S2 Vascular: Thrill and bruit present in AV fistula Vessel Right Left  Radial Palpable Palpable                                   Gastrointestinal: soft, non-tender/non-distended. No guarding/reflex.  Musculoskeletal: M/S 5/5 throughout.  Extremities without ischemic changes.  No deformity or atrophy.  Neurologic: CN 2-12 intact. Pain and light touch intact in extremities.  Symmetrical.  Speech is fluent. Motor exam as listed above. Psychiatric: Judgment intact, Mood & affect appropriate for pt's clinical situation. Dermatologic: No rashes or ulcers noted.  No cellulitis or open wounds. Lymph : No Cervical, Axillary, or Inguinal lymphadenopathy.     CBC Lab Results  Component Value Date   WBC 9.3 09/07/2015   HGB 10.0 (L) 09/07/2015   HCT 31.2 (L) 09/07/2015   MCV 85.5 09/07/2015   PLT 190 09/07/2015    BMET    Component Value Date/Time   NA 135 09/03/2015 0548   NA 144 03/24/2015 1403   NA 139 03/18/2014 1504   K 4.7 09/03/2015 0548   K 2.8 (L) 03/18/2014 1504   CL 95 (L) 09/03/2015 0548   CL 100 03/18/2014 1504   CO2 27 09/03/2015 0548   CO2 30 03/18/2014 1504   GLUCOSE 182 (H) 09/03/2015 0548   GLUCOSE 138 (H) 03/18/2014 1504   BUN 73 (H) 09/03/2015 0548   BUN 65 (H) 03/24/2015 1403   BUN 74 (H) 03/18/2014 1504   CREATININE 3.88 (H) 09/03/2015 0548   CREATININE 3.67 (H) 03/18/2014 1504   CALCIUM 10.7 (H) 09/03/2015 0548   CALCIUM 9.8 03/18/2014 1504   GFRNONAA 10 (L) 09/03/2015 0548   GFRNONAA 13 (L) 03/18/2014 1504   GFRAA 12 (L) 09/03/2015 0548   GFRAA 15 (L) 03/18/2014 1504   CrCl cannot be calculated (Unknown ideal weight.).  COAG Lab Results  Component Value Date   INR 1.1 10/27/2013    Radiology No results found.    Assessment/Plan 1. End-stage renal disease and a  permanent dialysis access. No longer needs her catheter. This can be removed today. 2. Atrial fibrillation. On chronic anticoagulation. 3. Hypertension. Stable on outpatient medications. 4. Hyperlipidemia. Stable on outpatient medications.   Leighla Chestnutt, MD  02/20/2016 8:25 AM

## 2016-02-20 NOTE — Op Note (Signed)
Operative Note     Preoperative diagnosis:   1. ESRD with functional permanent access  Postoperative diagnosis:  1. ESRD with functional permanent access  Procedure:  Removal of right jugular Permcath  Surgeon:  Leotis Pain, MD  Anesthesia:  Local  EBL:  Minimal  Indication for the Procedure:  The patient has a functional permanent dialysis access and no longer needs their permcath.  This can be removed.  Risks and benefits are discussed and informed consent is obtained.  Description of the Procedure:  The patient's right neck, chest and existing catheter were sterilely prepped and draped. The area around the catheter was anesthetized copiously with 1% lidocaine. The catheter was dissected out with curved hemostats until the cuff was freed from the surrounding fibrous sheath. The fiber sheath was transected, and the catheter was then removed in its entirety using gentle traction. Pressure was held and sterile dressings were placed. The patient tolerated the procedure well and was taken to the recovery room in stable condition.     DEW,JASON  02/20/2016, 1:00 PM

## 2016-02-21 ENCOUNTER — Encounter: Payer: Self-pay | Admitting: Vascular Surgery

## 2016-02-24 ENCOUNTER — Encounter: Payer: Self-pay | Admitting: Cardiovascular Disease

## 2016-02-24 ENCOUNTER — Ambulatory Visit (INDEPENDENT_AMBULATORY_CARE_PROVIDER_SITE_OTHER): Payer: Medicare HMO | Admitting: Cardiovascular Disease

## 2016-02-24 VITALS — BP 100/56 | HR 54 | Ht 67.0 in | Wt 210.0 lb

## 2016-02-24 DIAGNOSIS — I10 Essential (primary) hypertension: Secondary | ICD-10-CM | POA: Diagnosis not present

## 2016-02-24 DIAGNOSIS — I4819 Other persistent atrial fibrillation: Secondary | ICD-10-CM

## 2016-02-24 DIAGNOSIS — I5032 Chronic diastolic (congestive) heart failure: Secondary | ICD-10-CM

## 2016-02-24 DIAGNOSIS — I481 Persistent atrial fibrillation: Secondary | ICD-10-CM

## 2016-02-24 MED ORDER — AMIODARONE HCL 200 MG PO TABS: 200.0000 mg | ORAL_TABLET | Freq: Every day | ORAL | 3 refills | Status: AC

## 2016-02-24 NOTE — Progress Notes (Signed)
Cardiology Office Note   Date:  02/24/2016   ID:  Teresa Lutz, DOB 10-01-1934, MRN [id number]  PCP:  Casilda Carls, MD  Cardiologist:   Kathlyn Sacramento, MD  Nephrologist : Dr. Juleen China   Chief Complaint  Patient presents with  . Other    4 month follow up. Pt. c/o shortness of breath. Meds reviewed by the pt's med list.       History of Present Illness: Teresa Lutz is a 81 y.o. female who presents for a followup visit regarding chronic atrial fibrillation and chronic diastolic heart failure. She had cardiac catheterization done in 2012 which showed mild nonobstructive coronary artery disease. She has chronic medical conditions that include type 2 diabetes, hypertension, hyperlipidemia, ESRD on HD, anemia of chronic disease, sleep apnea on CPAP, hypothyroidism, chronic leg edema and history of breast cancer.  Most recent echocardiogram in November 2016 showed normal LV systolic function, mildly dilated left atrium and mildly elevated pulmonary pressure at 41 mmHg. She was hospitalized in March 2017 with acute on chronicDiastolic heart failure with massive volume overload in the setting of worsening renal function. She will also had atrial fibrillation with rapid ventricular response. The patient was initiated on hemodialysis with subsequent improvement in volume overload. Ventricular rate was relatively difficult due to low blood pressure. She was started on amiodarone for rate control. She has been staying at [facility]. She reports stable dyspnea with no chest pain. She continues to have significant leg edema.    Past Medical History:  Diagnosis Date  . Anemia of chronic disease   . Arthritis 1990  . Breast cancer ([place]) 2012   Right stage 1  . Chronic atrial fibrillation ([place]) 2014   a. Eliquis 5 mg bid; CHADSVASc 5 (CHF, HTN, age x 2, female)  . Chronic diastolic CHF (congestive heart failure) (HCC)    a. EF 60-65% by echo in 04/2015  . CKD (chronic kidney disease),  stage IV ([place])   . Goiter, specified as simple   . Hemorrhoid   . Hyperlipidemia   . Hypertension 1960  . Hypopotassemia   . Lower extremity edema   . Malignant neoplasm of upper-inner quadrant of female breast ([place])    right  . Pulmonary HTN ([place])    a. PA peak pressure 41 mmHg by echo in 04/2015  . Ulcer 1980  . Unspecified hypothyroidism   . Unspecified vitamin D deficiency     Past Surgical History:  Procedure Laterality Date  . AV FISTULA PLACEMENT Left 09/02/2015   Procedure: INSERTION OF ARTERIOVENOUS (AV) GORE-TEX GRAFT ARM;  Surgeon: Katha Cabal, MD;  Location: [facility];  Service: Vascular;  Laterality: Left;  . BREAST LUMPECTOMY Right 50 years ago  . BREAST LUMPECTOMY Right 2012   invasive   . CARDIAC CATHETERIZATION  06/2010   [facility]: Mild nonobstructive coronary artery disease with normal ejection fraction.  [name] CARDIOVERSION    . COLONOSCOPY  2011  . mammosite balloon placement  Right 11/2010  . PERIPHERAL VASCULAR CATHETERIZATION N/A 08/29/2015   Procedure: Dialysis/Perma Catheter Insertion;  Surgeon: Algernon Huxley, MD;  Location: [facility];  Service: Cardiovascular;  Laterality: N/A;  . PERIPHERAL VASCULAR CATHETERIZATION N/A 02/20/2016   Procedure: Dialysis/Perma Catheter Removal;  Surgeon: Algernon Huxley, MD;  Location: [facility];  Service: Cardiovascular;  Laterality: N/A;     Current Outpatient Prescriptions  Medication Sig Dispense Refill  . acetaminophen (TYLENOL) 325 MG tablet Take 325 mg by mouth every  6 (six) hours as needed for mild pain.    [name] amiodarone (PACERONE) 200 MG tablet Take 1 tablet (200 mg total) by mouth 2 (two) times daily.    [name] apixaban (ELIQUIS) 2.5 MG TABS tablet Take 2.5 mg by mouth 2 (two) times daily.     [name] BESIVANCE 0.6 % SUSP Place 1 drop into the left eye 4 (four) times daily. Pt only uses this medication for two days once a month.    . carvedilol (COREG) 3.125 MG tablet Take 1 tablet (3.125 mg total) by mouth 2  (two) times daily with a meal.    . diltiazem (CARDIZEM) 30 MG tablet Take 1 tablet (30 mg total) by mouth 3 (three) times daily.    . ferrous sulfate 325 (65 FE) MG tablet Take 325 mg by mouth daily with breakfast.    . guaiFENesin-codeine 100-10 MG/5ML syrup Take 5 mLs by mouth every 6 (six) hours as needed for cough. 120 mL 0  . letrozole (FEMARA) 2.5 MG tablet Take 2.5 mg by mouth daily.     [name] levothyroxine (SYNTHROID, LEVOTHROID) 25 MCG tablet Take 25 mcg by mouth daily before breakfast.     . midodrine (PROAMATINE) 10 MG tablet Take 1 tablet (10 mg total) by mouth 3 (three) times a week. On Tue-Thur-Sat prior to Dialysis     No current facility-administered medications for this visit.     Allergies:   Celebrex [celecoxib]    Social History:  The patient  reports that she has never smoked. She has never used smokeless tobacco. She reports that she does not drink alcohol or use drugs.   Family History:  The patient's family history includes Cancer in her brother and mother; Hypertension in her mother; Stroke in her father.    ROS:  Please see the history of present illness.   Otherwise, review of systems are positive for none.   All other systems are reviewed and negative.    PHYSICAL EXAM: VS:  Ht 5\' 7"  (1.702 m)   Wt 210 lb (95.3 kg) Comment: checked at [facility]  BMI 32.89 kg/m  , BMI Body mass index is 32.89 kg/m. GEN: Well nourished, well developed, in no acute distress  HEENT: normal  Neck: no JVD, carotid bruits, or masses Cardiac: RRR; no murmurs, rubs, or gallops, +2 edema Respiratory:  Diminished breath sounds at the base, normal work of breathing GI: soft, nontender, nondistended, + BS MS: no deformity or atrophy  Skin: warm and dry, no rash Neuro:  Strength and sensation are intact Psych: euthymic mood, full affect   EKG:  EKG is ordered today. The ekg ordered today demonstrates sinus bradycardia with nonspecific T wave changes.   Recent  Labs: 08/22/2015: ALT 18; B Natriuretic Peptide 809.0 08/29/2015: Magnesium 2.2 09/03/2015: BUN 73; Creatinine, Ser 3.88; Potassium 4.7; Sodium 135 09/07/2015: Hemoglobin 10.0; Platelets 190    Lipid Panel    Component Value Date/Time   CHOL 133 08/24/2015 0854   TRIG 169 (H) 08/24/2015 0854   HDL 34 (L) 08/24/2015 0854   CHOLHDL 3.9 08/24/2015 0854   VLDL 34 08/24/2015 0854   LDLCALC 65 08/24/2015 0854      Wt Readings from Last 3 Encounters:  02/24/16 210 lb (95.3 kg)  02/20/16 210 lb (95.3 kg)  09/08/15 203 lb 14.8 oz (92.5 kg)      No flowsheet data found.    ASSESSMENT AND PLAN:  1.  Persistent atrial fibrillation: She is noted to be in  sinus rhythm today. This is likely the effect of amiodarone. She is bradycardic and thus I decreased the dose to 200 mg once daily. She is also on diltiazem and carvedilol. I elected to discontinue diltiazem especially with her significant leg edema. Continue anticoagulation with low dose Eliquis.  2. Chronic diastolic heart failure: She appears to be volume overloaded. Hopefully with stopping diltiazem and maintaining sinus rhythm she might be able to tolerate more fluid removal during dialysis.  3. Essential hypertension: Blood pressure is borderline low. Diltiazem was discontinued.     Disposition:   FU with me in 2 months  Signed,  Kathlyn Sacramento, MD  02/24/2016 2:55 PM    [place]

## 2016-02-24 NOTE — Patient Instructions (Signed)
Medication Instructions:  Your physician has recommended you make the following change in your medication:  STOP taking diltiazem DECREASE amiodarone to 200mg  once daily   Labwork: none  Testing/Procedures: None.  Follow-Up: Your physician recommends that you schedule a follow-up appointment in: 2 months with Dr. Fletcher Anon.    Any Other Special Instructions Will Be Listed Below (If Applicable).     If you need a refill on your cardiac medications before your next appointment, please call your pharmacy.

## 2016-02-29 ENCOUNTER — Other Ambulatory Visit: Payer: Self-pay | Admitting: General Surgery

## 2016-02-29 DIAGNOSIS — C50911 Malignant neoplasm of unspecified site of right female breast: Secondary | ICD-10-CM

## 2016-03-02 ENCOUNTER — Emergency Department
Admission: EM | Admit: 2016-03-02 | Discharge: 2016-03-02 | Disposition: A | Discharge status: Home / Self Care | Payer: Medicare HMO | Attending: Emergency Medicine | Admitting: Emergency Medicine

## 2016-03-02 ENCOUNTER — Emergency Department: Payer: Medicare HMO

## 2016-03-02 DIAGNOSIS — I5032 Chronic diastolic (congestive) heart failure: Secondary | ICD-10-CM | POA: Insufficient documentation

## 2016-03-02 DIAGNOSIS — Y999 Unspecified external cause status: Secondary | ICD-10-CM | POA: Insufficient documentation

## 2016-03-02 DIAGNOSIS — I132 Hypertensive heart and chronic kidney disease with heart failure and with stage 5 chronic kidney disease, or end stage renal disease: Secondary | ICD-10-CM | POA: Diagnosis not present

## 2016-03-02 DIAGNOSIS — Z992 Dependence on renal dialysis: Secondary | ICD-10-CM | POA: Insufficient documentation

## 2016-03-02 DIAGNOSIS — Y9389 Activity, other specified: Secondary | ICD-10-CM | POA: Diagnosis not present

## 2016-03-02 DIAGNOSIS — W19XXXA Unspecified fall, initial encounter: Secondary | ICD-10-CM

## 2016-03-02 DIAGNOSIS — Z853 Personal history of malignant neoplasm of breast: Secondary | ICD-10-CM | POA: Insufficient documentation

## 2016-03-02 DIAGNOSIS — Y929 Unspecified place or not applicable: Secondary | ICD-10-CM | POA: Diagnosis not present

## 2016-03-02 DIAGNOSIS — W06XXXA Fall from bed, initial encounter: Secondary | ICD-10-CM | POA: Insufficient documentation

## 2016-03-02 DIAGNOSIS — S0990XA Unspecified injury of head, initial encounter: Secondary | ICD-10-CM | POA: Diagnosis present

## 2016-03-02 DIAGNOSIS — Z79899 Other long term (current) drug therapy: Secondary | ICD-10-CM | POA: Diagnosis not present

## 2016-03-02 DIAGNOSIS — N186 End stage renal disease: Secondary | ICD-10-CM | POA: Diagnosis not present

## 2016-03-02 DIAGNOSIS — E039 Hypothyroidism, unspecified: Secondary | ICD-10-CM | POA: Insufficient documentation

## 2016-03-02 NOTE — ED Provider Notes (Signed)
[facility] Emergency Department Provider Note   ____________________________________________    I have reviewed the triage vital signs and the nursing notes.   HISTORY  Chief Complaint Fall     HPI Teresa Lutz is a 81 y.o. female who presents after a fall. Patient reports she was reaching for a pillow which on the floor and she lost her balance and fell out of bed. She states she struck the front of her head but denies LOC. She reports she feels well currently. No neck pain. No chest pain. No extremity pain at all. She is on eliquis for atrial fibrillation and also receives dialysis she had as scheduled yesterday. She reports normal breathing.   Past Medical History:  Diagnosis Date  . Anemia of chronic disease   . Arthritis 1990  . Breast cancer ([place]) 2012   Right stage 1  . Chronic atrial fibrillation ([place]) 2014   a. Eliquis 5 mg bid; CHADSVASc 5 (CHF, HTN, age x 2, female)  . Chronic diastolic CHF (congestive heart failure) (HCC)    a. EF 60-65% by echo in 04/2015  . CKD (chronic kidney disease), stage IV ([place])   . Goiter, specified as simple   . Hemorrhoid   . Hyperlipidemia   . Hypertension 1960  . Hypopotassemia   . Lower extremity edema   . Malignant neoplasm of upper-inner quadrant of female breast ([place])    right  . Pulmonary HTN ([place])    a. PA peak pressure 41 mmHg by echo in 04/2015  . Ulcer 1980  . Unspecified hypothyroidism   . Unspecified vitamin D deficiency     Patient Active Problem List   Diagnosis Date Noted  . Acute kidney failure, unspecified ([place])   . Perforated diverticulum of large intestine   . Free intraperitoneal air   . Atrial fibrillation with rapid ventricular response ([place])   . Hypertensive heart disease 08/24/2015  . Pressure ulcer 08/23/2015  . Acute on chronic diastolic (congestive) heart failure ([place]) 08/23/2015  . End stage renal disease ([place])   . Generalized edema   . Hypoalbuminemia due to  protein-calorie malnutrition ([place])   . Absolute anemia   . Atrial fibrillation with RVR ([place]) 08/22/2015  . CKD (chronic kidney disease), stage IV ([place])   . Hyperlipidemia   . Lower extremity edema   . Chronic atrial fibrillation ([place])   . Chronic diastolic CHF (congestive heart failure) ([place])   . Pulmonary HTN ([place])   . Anemia of chronic disease   . Obstructive sleep apnea 02/17/2015  . Breast cancer, invasive mammary. T1,N0,M0. ER/PR pos, Her 2 neg 02/08/2014  . Hypertension     Past Surgical History:  Procedure Laterality Date  . AV FISTULA PLACEMENT Left 09/02/2015   Procedure: INSERTION OF ARTERIOVENOUS (AV) GORE-TEX GRAFT ARM;  Surgeon: Katha Cabal, MD;  Location: [facility] ORS;  Service: Vascular;  Laterality: Left;  . BREAST LUMPECTOMY Right 50 years ago  . BREAST LUMPECTOMY Right 2012   invasive   . CARDIAC CATHETERIZATION  06/2010   [facility]: Mild nonobstructive coronary artery disease with normal ejection fraction.  [name] CARDIOVERSION    . COLONOSCOPY  2011  . mammosite balloon placement  Right 11/2010  . PERIPHERAL VASCULAR CATHETERIZATION N/A 08/29/2015   Procedure: Dialysis/Perma Catheter Insertion;  Surgeon: Algernon Huxley, MD;  Location: [place] [facility];  Service: Cardiovascular;  Laterality: N/A;  . PERIPHERAL VASCULAR CATHETERIZATION N/A 02/20/2016   Procedure: Dialysis/Perma Catheter Removal;  Surgeon: Corene Cornea  Bunnie Domino, MD;  Location: [place] [facility];  Service: Cardiovascular;  Laterality: N/A;    Prior to Admission medications   Medication Sig Start Date End Date Taking? Authorizing Provider  acetaminophen (TYLENOL) 325 MG tablet Take 325 mg by mouth every 6 (six) hours as needed for mild pain.   Yes Historical Provider, MD  amiodarone (PACERONE) 200 MG tablet Take 1 tablet (200 mg total) by mouth daily. 02/24/16  Yes Wellington Hampshire, MD  apixaban (ELIQUIS) 2.5 MG TABS tablet Take 2.5 mg by mouth 2 (two) times daily.    Yes Historical Provider, MD  b complex-vitamin  c-folic acid (NEPHRO-VITE) 0.8 MG TABS tablet Take 1 tablet by mouth daily.   Yes Historical Provider, MD  Besifloxacin HCl 0.6 % SUSP Place 1 drop into the left eye See admin instructions. Instill 4 times daily every 1 month starting on the last day of month for 2 days   Yes Historical Provider, MD  carvedilol (COREG) 3.125 MG tablet Take 1 tablet (3.125 mg total) by mouth 2 (two) times daily with a meal. 09/08/15  Yes Srikar Sudini, MD  ferrous sulfate 325 (65 FE) MG tablet Take 325 mg by mouth daily.    Yes Historical Provider, MD  guaiFENesin-codeine 100-10 MG/5ML syrup Take 5 mLs by mouth every 6 (six) hours as needed for cough. Patient taking differently: Take 5 mLs by mouth every 4 (four) hours as needed for cough.  09/08/15  Yes Srikar Sudini, MD  letrozole [facility]) 2.5 MG tablet Take 2.5 mg by mouth daily.    Yes Historical Provider, MD  levothyroxine (SYNTHROID, LEVOTHROID) 25 MCG tablet Take 25 mcg by mouth daily.    Yes Historical Provider, MD  midodrine (PROAMATINE) 10 MG tablet Take 1 tablet (10 mg total) by mouth 3 (three) times a week. On Tue-Thur-Sat prior to Dialysis Patient taking differently: Take 10 mg by mouth 3 (three) times a week. Every Tuesday, Thursday, and Saturday, before dialysis 09/08/15  Yes Srikar Sudini, MD  mirtazapine (REMERON) 15 MG tablet Take 15 mg by mouth at bedtime.  02/21/16  Yes Historical Provider, MD  mupirocin ointment (BACTROBAN) 2 % Place 1 application into the nose 3 (three) times daily. Apply to left great toe   Yes Historical Provider, MD  sertraline (ZOLOFT) 50 MG tablet Take 50 mg by mouth daily.  02/06/16  Yes Historical Provider, MD  sevelamer carbonate (RENVELA) 800 MG tablet Take 800 mg by mouth 3 (three) times daily with meals.   Yes Historical Provider, MD  Skin Protectants, Misc. (CALAZIME SKIN PROTECTANT EX) Apply 1 application topically 2 (two) times daily. Apply to sacral/buttocks   Yes Historical Provider, MD     Allergies Celebrex  [celecoxib]  Family History  Problem Relation Age of Onset  . Cancer Mother     ovarian  . Hypertension Mother   . Stroke Father   . Cancer Brother     colon    Social History Social History  Substance Use Topics  . Smoking status: Never Smoker  . Smokeless tobacco: Never Used  . Alcohol use No    Review of Systems  Constitutional: No Dizziness Eyes: No visual changes.  ENT: No neck pain Cardiovascular: Denies chest pain. Respiratory: Denies shortness of breath. Gastrointestinal: No abdominal pain.  No nausea, no vomiting.   Genitourinary: Negative for dysuria. Musculoskeletal: Negative for back pain. Negative for hip pain or extremity pain Skin: Negative for abrasion or laceration Neurological: Negative for headaches or focal weakness  10-point ROS otherwise negative.  ____________________________________________   PHYSICAL EXAM:  VITAL SIGNS: ED Triage Vitals  Enc Vitals Group     BP 03/02/16 0615 (!) 99/58     Pulse Rate 03/02/16 0615 86     Resp 03/02/16 0615 18     Temp 03/02/16 0615 97.4 F (36.3 C)     Temp Source 03/02/16 0615 Oral     SpO2 03/02/16 0615 98 %     Weight 03/02/16 0617 218 lb (98.9 kg)     Height 03/02/16 0617 5\' 7"  (1.702 m)     Head Circumference --      Peak Flow --      Pain Score 03/02/16 0617 3     Pain Loc --      Pain Edu? --      Excl. in [place]? --     Constitutional: Alert and oriented. No acute distress. Pleasant and interactive Eyes: Conjunctivae are normal.  Head: Atraumatic.No swelling or laceration or bony abnormalities Nose: No congestion/rhinnorhea. Mouth/Throat: Mucous membranes are moist.   Neck:  Painless ROM, no vertebral tenderness to palpation Cardiovascular: Normal rate, irregularly irregular rhythm. Grossly normal heart sounds.  Good peripheral circulation. Left AV fistula with thrill Respiratory: Normal respiratory effort.  No retractions. Lungs CTAB. Gastrointestinal: Soft and nontender. No distention.   No CVA tenderness. Genitourinary: deferred Musculoskeletal: No lower extremity tenderness, mild edema bilaterally. Upper extremities with full range of motion. No hip pain with axial load. Full range of motion of the lower extremities. Neurologic:  Normal speech and language. No gross focal neurologic deficits are appreciated. Normal cranial nerves Skin:  Skin is warm, dry and intact. No rash noted. Psychiatric: Mood and affect are normal. Speech and behavior are normal.  ____________________________________________   LABS (all labs ordered are listed, but only abnormal results are displayed)  Labs Reviewed - No data to display ____________________________________________  EKG  None ____________________________________________  RADIOLOGY  CT head unremarkable ____________________________________________   PROCEDURES  Procedure(s) performed: No    Critical Care performed: No ____________________________________________   INITIAL IMPRESSION / ASSESSMENT AND PLAN / ED COURSE  Pertinent labs & imaging results that were available during my care of the patient were reviewed by me and considered in my medical decision making (see chart for details).  Patient with mild head injury. CT head is unremarkable. She is well-appearing neurologically intact. No evidence of extremity injury or back injury. Appropriate for discharge at this time.  Clinical Course   ____________________________________________   FINAL CLINICAL IMPRESSION(S) / ED DIAGNOSES  Final diagnoses:  Head injury, initial encounter  Fall, initial encounter      NEW MEDICATIONS STARTED DURING THIS VISIT:  New Prescriptions   No medications on file     Note:  This document was prepared using Dragon voice recognition software and may include unintentional dictation errors.    Lavonia Drafts, MD 03/02/16 [phone]

## 2016-03-02 NOTE — ED Notes (Signed)
Pt and Pt's daughter verbalized understanding of discharge instructions. NAD at this time. 

## 2016-03-02 NOTE — ED Notes (Signed)
Called and spoke to Pt's daughter [name]. Per pt's daughter, pt is sometimes disoriented to time. Pt has begun to have memory problems this past year

## 2016-03-02 NOTE — ED Notes (Signed)
Pt comes from [facility].  

## 2016-03-02 NOTE — ED Triage Notes (Signed)
Per EMS: Pt was reaching across bed and fell. Pt hit head on base of beside table. Pt denies any other injury. Pt AO X 4. Pt has hx of breast surgery left side, and dialysis fistula right side.

## 2016-03-07 ENCOUNTER — Inpatient Hospital Stay
Admission: RE | Admit: 2016-03-07 | Discharge: 2016-03-07 | Disposition: A | Discharge status: Home / Self Care | Payer: Self-pay | Source: Ambulatory Visit | Attending: *Deleted | Admitting: *Deleted

## 2016-03-07 ENCOUNTER — Other Ambulatory Visit: Payer: Self-pay | Admitting: *Deleted

## 2016-03-07 DIAGNOSIS — Z9289 Personal history of other medical treatment: Secondary | ICD-10-CM

## 2016-03-08 ENCOUNTER — Inpatient Hospital Stay: Payer: Medicare HMO

## 2016-03-08 ENCOUNTER — Encounter: Payer: Self-pay | Admitting: Emergency Medicine

## 2016-03-08 ENCOUNTER — Inpatient Hospital Stay
Admission: EM | Admit: 2016-03-08 | Discharge: 2016-03-25 | DRG: 870 | Disposition: E | Payer: Medicare HMO | Attending: Internal Medicine | Admitting: Internal Medicine

## 2016-03-08 ENCOUNTER — Emergency Department: Payer: Medicare HMO

## 2016-03-08 DIAGNOSIS — Z6831 Body mass index (BMI) 31.0-31.9, adult: Secondary | ICD-10-CM | POA: Diagnosis not present

## 2016-03-08 DIAGNOSIS — D638 Anemia in other chronic diseases classified elsewhere: Secondary | ICD-10-CM | POA: Diagnosis present

## 2016-03-08 DIAGNOSIS — Z992 Dependence on renal dialysis: Secondary | ICD-10-CM

## 2016-03-08 DIAGNOSIS — I447 Left bundle-branch block, unspecified: Secondary | ICD-10-CM | POA: Diagnosis present

## 2016-03-08 DIAGNOSIS — I272 Other secondary pulmonary hypertension: Secondary | ICD-10-CM | POA: Diagnosis present

## 2016-03-08 DIAGNOSIS — R600 Localized edema: Secondary | ICD-10-CM | POA: Diagnosis present

## 2016-03-08 DIAGNOSIS — I5033 Acute on chronic diastolic (congestive) heart failure: Secondary | ICD-10-CM | POA: Diagnosis present

## 2016-03-08 DIAGNOSIS — R001 Bradycardia, unspecified: Secondary | ICD-10-CM | POA: Diagnosis present

## 2016-03-08 DIAGNOSIS — A419 Sepsis, unspecified organism: Principal | ICD-10-CM | POA: Diagnosis present

## 2016-03-08 DIAGNOSIS — IMO0002 Reserved for concepts with insufficient information to code with codable children: Secondary | ICD-10-CM

## 2016-03-08 DIAGNOSIS — Z809 Family history of malignant neoplasm, unspecified: Secondary | ICD-10-CM

## 2016-03-08 DIAGNOSIS — I498 Other specified cardiac arrhythmias: Secondary | ICD-10-CM

## 2016-03-08 DIAGNOSIS — I2510 Atherosclerotic heart disease of native coronary artery without angina pectoris: Secondary | ICD-10-CM | POA: Diagnosis present

## 2016-03-08 DIAGNOSIS — Z515 Encounter for palliative care: Secondary | ICD-10-CM | POA: Diagnosis present

## 2016-03-08 DIAGNOSIS — N2581 Secondary hyperparathyroidism of renal origin: Secondary | ICD-10-CM | POA: Diagnosis present

## 2016-03-08 DIAGNOSIS — I959 Hypotension, unspecified: Secondary | ICD-10-CM | POA: Diagnosis present

## 2016-03-08 DIAGNOSIS — Z853 Personal history of malignant neoplasm of breast: Secondary | ICD-10-CM | POA: Diagnosis not present

## 2016-03-08 DIAGNOSIS — I469 Cardiac arrest, cause unspecified: Secondary | ICD-10-CM | POA: Diagnosis present

## 2016-03-08 DIAGNOSIS — I482 Chronic atrial fibrillation, unspecified: Secondary | ICD-10-CM | POA: Diagnosis present

## 2016-03-08 DIAGNOSIS — R0600 Dyspnea, unspecified: Secondary | ICD-10-CM | POA: Diagnosis present

## 2016-03-08 DIAGNOSIS — N049 Nephrotic syndrome with unspecified morphologic changes: Secondary | ICD-10-CM | POA: Diagnosis present

## 2016-03-08 DIAGNOSIS — D631 Anemia in chronic kidney disease: Secondary | ICD-10-CM | POA: Diagnosis present

## 2016-03-08 DIAGNOSIS — E46 Unspecified protein-calorie malnutrition: Secondary | ICD-10-CM | POA: Diagnosis present

## 2016-03-08 DIAGNOSIS — J9601 Acute respiratory failure with hypoxia: Secondary | ICD-10-CM | POA: Diagnosis present

## 2016-03-08 DIAGNOSIS — N186 End stage renal disease: Secondary | ICD-10-CM | POA: Diagnosis present

## 2016-03-08 DIAGNOSIS — Z66 Do not resuscitate: Secondary | ICD-10-CM | POA: Diagnosis present

## 2016-03-08 DIAGNOSIS — E1122 Type 2 diabetes mellitus with diabetic chronic kidney disease: Secondary | ICD-10-CM | POA: Diagnosis present

## 2016-03-08 DIAGNOSIS — I132 Hypertensive heart and chronic kidney disease with heart failure and with stage 5 chronic kidney disease, or end stage renal disease: Secondary | ICD-10-CM | POA: Diagnosis present

## 2016-03-08 DIAGNOSIS — G4733 Obstructive sleep apnea (adult) (pediatric): Secondary | ICD-10-CM | POA: Diagnosis present

## 2016-03-08 DIAGNOSIS — E039 Hypothyroidism, unspecified: Secondary | ICD-10-CM | POA: Diagnosis present

## 2016-03-08 DIAGNOSIS — R6521 Severe sepsis with septic shock: Secondary | ICD-10-CM | POA: Diagnosis present

## 2016-03-08 DIAGNOSIS — J189 Pneumonia, unspecified organism: Secondary | ICD-10-CM

## 2016-03-08 DIAGNOSIS — J9600 Acute respiratory failure, unspecified whether with hypoxia or hypercapnia: Secondary | ICD-10-CM | POA: Diagnosis present

## 2016-03-08 DIAGNOSIS — J90 Pleural effusion, not elsewhere classified: Secondary | ICD-10-CM

## 2016-03-08 DIAGNOSIS — Z9911 Dependence on respirator [ventilator] status: Secondary | ICD-10-CM | POA: Diagnosis not present

## 2016-03-08 DIAGNOSIS — L89622 Pressure ulcer of left heel, stage 2: Secondary | ICD-10-CM | POA: Diagnosis present

## 2016-03-08 DIAGNOSIS — R140 Abdominal distension (gaseous): Secondary | ICD-10-CM

## 2016-03-08 DIAGNOSIS — R0902 Hypoxemia: Secondary | ICD-10-CM

## 2016-03-08 DIAGNOSIS — Z823 Family history of stroke: Secondary | ICD-10-CM

## 2016-03-08 DIAGNOSIS — Z8249 Family history of ischemic heart disease and other diseases of the circulatory system: Secondary | ICD-10-CM

## 2016-03-08 DIAGNOSIS — Z7901 Long term (current) use of anticoagulants: Secondary | ICD-10-CM

## 2016-03-08 DIAGNOSIS — E785 Hyperlipidemia, unspecified: Secondary | ICD-10-CM | POA: Diagnosis present

## 2016-03-08 DIAGNOSIS — Z4659 Encounter for fitting and adjustment of other gastrointestinal appliance and device: Secondary | ICD-10-CM

## 2016-03-08 LAB — URINALYSIS COMPLETE WITH MICROSCOPIC (ARMC ONLY)
BACTERIA UA: NONE SEEN
Bilirubin Urine: NEGATIVE
GLUCOSE, UA: 50 mg/dL — AB
Ketones, ur: NEGATIVE mg/dL
NITRITE: NEGATIVE
PH: 6 (ref 5.0–8.0)
Protein, ur: 100 mg/dL — AB
Specific Gravity, Urine: 1.015 (ref 1.005–1.030)
Squamous Epithelial / LPF: NONE SEEN

## 2016-03-08 LAB — CBC WITH DIFFERENTIAL/PLATELET
BASOS ABS: 0 10*3/uL (ref 0–0.1)
EOS ABS: 0 10*3/uL (ref 0–0.7)
Eosinophils Relative: 0 %
HEMATOCRIT: 29.8 % — AB (ref 35.0–47.0)
HEMOGLOBIN: 9.6 g/dL — AB (ref 12.0–16.0)
Lymphocytes Relative: 4 %
Lymphs Abs: 0.7 10*3/uL — ABNORMAL LOW (ref 1.0–3.6)
MCH: 31.4 pg (ref 26.0–34.0)
MCHC: 32.3 g/dL (ref 32.0–36.0)
MCV: 97 fL (ref 80.0–100.0)
Monocytes Absolute: 0.9 10*3/uL (ref 0.2–0.9)
Monocytes Relative: 5 %
NEUTROS ABS: 15.6 10*3/uL — AB (ref 1.4–6.5)
Platelets: 93 10*3/uL — ABNORMAL LOW (ref 150–440)
RBC: 3.07 MIL/uL — AB (ref 3.80–5.20)
RDW: 21.2 % — ABNORMAL HIGH (ref 11.5–14.5)
WBC: 17.3 10*3/uL — AB (ref 3.6–11.0)

## 2016-03-08 LAB — COMPREHENSIVE METABOLIC PANEL
ALBUMIN: 1.5 g/dL — AB (ref 3.5–5.0)
ALT: 63 U/L — ABNORMAL HIGH (ref 14–54)
ANION GAP: 7 (ref 5–15)
AST: 35 U/L (ref 15–41)
Alkaline Phosphatase: 122 U/L (ref 38–126)
BILIRUBIN TOTAL: 0.7 mg/dL (ref 0.3–1.2)
BUN: 21 mg/dL — AB (ref 6–20)
CHLORIDE: 98 mmol/L — AB (ref 101–111)
CO2: 35 mmol/L — ABNORMAL HIGH (ref 22–32)
Calcium: 8.5 mg/dL — ABNORMAL LOW (ref 8.9–10.3)
Creatinine, Ser: 2.67 mg/dL — ABNORMAL HIGH (ref 0.44–1.00)
GFR calc Af Amer: 18 mL/min — ABNORMAL LOW (ref >60)
GFR, EST NON AFRICAN AMERICAN: 16 mL/min — AB (ref >60)
Glucose, Bld: 109 mg/dL — ABNORMAL HIGH (ref 65–99)
POTASSIUM: 3.4 mmol/L — AB (ref 3.5–5.1)
Sodium: 140 mmol/L (ref 135–145)
TOTAL PROTEIN: 4.3 g/dL — AB (ref 6.5–8.1)

## 2016-03-08 LAB — GLUCOSE, CAPILLARY: GLUCOSE-CAPILLARY: 112 mg/dL — AB (ref 65–99)

## 2016-03-08 LAB — TROPONIN I
TROPONIN I: 0.04 ng/mL — AB (ref <0.03)
TROPONIN I: 0.13 ng/mL — AB (ref <0.03)

## 2016-03-08 LAB — PROTIME-INR
INR: 2.12
INR: 2.27
PROTHROMBIN TIME: 24.1 s — AB (ref 11.4–15.2)
PROTHROMBIN TIME: 25.4 s — AB (ref 11.4–15.2)

## 2016-03-08 LAB — BLOOD GAS, VENOUS
Acid-Base Excess: 16.3 mmol/L — ABNORMAL HIGH (ref 0.0–2.0)
BICARBONATE: 43.4 mmol/L — AB (ref 20.0–28.0)
O2 Saturation: 86.4 %
PATIENT TEMPERATURE: 37
PCO2 VEN: 70 mmHg — AB (ref 44.0–60.0)
PH VEN: 7.4 (ref 7.250–7.430)
PO2 VEN: 52 mmHg — AB (ref 32.0–45.0)

## 2016-03-08 LAB — APTT: APTT: 51 s — AB (ref 24–36)

## 2016-03-08 LAB — LACTIC ACID, PLASMA
Lactic Acid, Venous: 2.3 mmol/L (ref 0.5–1.9)
Lactic Acid, Venous: 1.4 mmol/L (ref 0.5–1.9)

## 2016-03-08 LAB — PROCALCITONIN: PROCALCITONIN: 1.57 ng/mL

## 2016-03-08 MED ORDER — FENTANYL CITRATE (PF) 100 MCG/2ML IJ SOLN: 50.0000 ug | INTRAMUSCULAR | Status: DC | PRN

## 2016-03-08 MED ORDER — CHLORHEXIDINE GLUCONATE 0.12% ORAL RINSE (MEDLINE KIT)
15.0000 mL | Freq: Two times a day (BID) | OROMUCOSAL | Status: DC
  Administered 2016-03-09 – 2016-03-10 (×3): 15 mL via OROMUCOSAL

## 2016-03-08 MED ORDER — SODIUM CHLORIDE 0.9 % IV SOLN
Freq: Once | INTRAVENOUS | Status: AC
  Administered 2016-03-08: 16:00:00 via INTRAVENOUS

## 2016-03-08 MED ORDER — HEPARIN SODIUM (PORCINE) 5000 UNIT/ML IJ SOLN
5000.0000 [IU] | Freq: Three times a day (TID) | INTRAMUSCULAR | Status: DC
  Administered 2016-03-09 – 2016-03-12 (×12): 5000 [IU] via SUBCUTANEOUS
  Filled 2016-03-08 (×12): qty 1

## 2016-03-08 MED ORDER — VANCOMYCIN HCL IN DEXTROSE 1-5 GM/200ML-% IV SOLN: 1000.0000 mg | INTRAVENOUS | Status: DC

## 2016-03-08 MED ORDER — MIDAZOLAM HCL 2 MG/2ML IJ SOLN
2.0000 mg | Freq: Once | INTRAMUSCULAR | Status: AC
  Administered 2016-03-08: 2 mg via INTRAVENOUS

## 2016-03-08 MED ORDER — VANCOMYCIN HCL IN DEXTROSE 1-5 GM/200ML-% IV SOLN: 1000.0000 mg | Freq: Once | INTRAVENOUS | Status: DC

## 2016-03-08 MED ORDER — FENTANYL 2500MCG IN NS 250ML (10MCG/ML) PREMIX INFUSION
25.0000 ug/h | INTRAVENOUS | Status: DC
  Administered 2016-03-09: 150 ug/h via INTRAVENOUS
  Administered 2016-03-10: 25 ug/h via INTRAVENOUS
  Administered 2016-03-10: 75 ug/h via INTRAVENOUS
  Administered 2016-03-11: 50 ug/h via INTRAVENOUS
  Administered 2016-03-12: 300 ug/h via INTRAVENOUS
  Filled 2016-03-08 (×3): qty 250

## 2016-03-08 MED ORDER — VANCOMYCIN HCL IN DEXTROSE 1-5 GM/200ML-% IV SOLN
1000.0000 mg | Freq: Once | INTRAVENOUS | Status: AC
  Administered 2016-03-08: 1000 mg via INTRAVENOUS
  Filled 2016-03-08: qty 200

## 2016-03-08 MED ORDER — ORAL CARE MOUTH RINSE
15.0000 mL | Freq: Four times a day (QID) | OROMUCOSAL | Status: DC
  Administered 2016-03-09 (×4): 15 mL via OROMUCOSAL
  Filled 2016-03-08 (×2): qty 15

## 2016-03-08 MED ORDER — FENTANYL CITRATE (PF) 100 MCG/2ML IJ SOLN
50.0000 ug | Freq: Once | INTRAMUSCULAR | Status: AC
Start: 2016-03-08 — End: 2016-03-08
  Administered 2016-03-08: 50 ug via INTRAVENOUS

## 2016-03-08 MED ORDER — SODIUM CHLORIDE 0.9 % IV SOLN
250.0000 mL | INTRAVENOUS | Status: DC | PRN
  Administered 2016-03-08: 250 mL via INTRAVENOUS

## 2016-03-08 MED ORDER — FAMOTIDINE IN NACL 20-0.9 MG/50ML-% IV SOLN: 20.0000 mg | Freq: Two times a day (BID) | INTRAVENOUS | Status: DC

## 2016-03-08 MED ORDER — PIPERACILLIN-TAZOBACTAM 3.375 G IVPB
3.3750 g | Freq: Two times a day (BID) | INTRAVENOUS | Status: DC
  Administered 2016-03-09 – 2016-03-12 (×8): 3.375 g via INTRAVENOUS
  Filled 2016-03-08 (×8): qty 50

## 2016-03-08 MED ORDER — FENTANYL CITRATE (PF) 100 MCG/2ML IJ SOLN
50.0000 ug | INTRAMUSCULAR | Status: AC | PRN
  Administered 2016-03-10 (×3): 50 ug via INTRAVENOUS

## 2016-03-08 MED ORDER — PIPERACILLIN-TAZOBACTAM 3.375 G IVPB 30 MIN: 3.3750 g | Freq: Once | INTRAVENOUS | Status: DC

## 2016-03-08 MED ORDER — FENTANYL 2500MCG IN NS 250ML (10MCG/ML) PREMIX INFUSION
INTRAVENOUS | Status: AC
  Filled 2016-03-08: qty 250

## 2016-03-08 MED ORDER — FENTANYL 2500MCG IN NS 250ML (10MCG/ML) PREMIX INFUSION
150.0000 ug/h | INTRAVENOUS | Status: DC
  Administered 2016-03-08: 150 ug/h via INTRAVENOUS

## 2016-03-08 MED ORDER — ORAL CARE MOUTH RINSE
15.0000 mL | Freq: Four times a day (QID) | OROMUCOSAL | Status: DC
  Administered 2016-03-09 – 2016-03-12 (×14): 15 mL via OROMUCOSAL

## 2016-03-08 MED ORDER — FAMOTIDINE IN NACL 20-0.9 MG/50ML-% IV SOLN
20.0000 mg | INTRAVENOUS | Status: DC
  Administered 2016-03-09 – 2016-03-12 (×5): 20 mg via INTRAVENOUS
  Filled 2016-03-08 (×5): qty 50

## 2016-03-08 MED ORDER — INSULIN ASPART 100 UNIT/ML ~~LOC~~ SOLN
0.0000 [IU] | SUBCUTANEOUS | Status: DC
  Administered 2016-03-09 – 2016-03-10 (×8): 2 [IU] via SUBCUTANEOUS
  Administered 2016-03-11: 3 [IU] via SUBCUTANEOUS
  Administered 2016-03-11 – 2016-03-12 (×3): 2 [IU] via SUBCUTANEOUS
  Filled 2016-03-08 (×6): qty 2
  Filled 2016-03-08: qty 3
  Filled 2016-03-08 (×5): qty 2

## 2016-03-08 MED ORDER — PIPERACILLIN-TAZOBACTAM 3.375 G IVPB 30 MIN
3.3750 g | Freq: Once | INTRAVENOUS | Status: AC
  Administered 2016-03-08: 3.375 g via INTRAVENOUS
  Filled 2016-03-08: qty 50

## 2016-03-08 MED ORDER — PHENYLEPHRINE HCL 10 MG/ML IJ SOLN
0.0000 ug/min | INTRAVENOUS | Status: DC
  Administered 2016-03-08: 20 ug/min via INTRAVENOUS
  Administered 2016-03-09: 70 ug/min via INTRAVENOUS
  Administered 2016-03-09: 100 ug/min via INTRAVENOUS
  Administered 2016-03-09: 60 ug/min via INTRAVENOUS
  Administered 2016-03-09: 100 ug/min via INTRAVENOUS
  Filled 2016-03-08 (×7): qty 1

## 2016-03-08 MED ORDER — ROCURONIUM BROMIDE 50 MG/5ML IV SOLN
50.0000 mg | Freq: Once | INTRAVENOUS | Status: AC
  Administered 2016-03-08: 50 mg via INTRAVENOUS

## 2016-03-08 MED ORDER — CHLORHEXIDINE GLUCONATE 0.12% ORAL RINSE (MEDLINE KIT)
15.0000 mL | Freq: Two times a day (BID) | OROMUCOSAL | Status: DC
  Administered 2016-03-08 – 2016-03-12 (×8): 15 mL via OROMUCOSAL

## 2016-03-08 NOTE — Progress Notes (Addendum)
Pharmacy Antibiotic Note  Teresa Lutz is a 81 y.o. female admitted on 03/18/2016 with sepsis.  Pharmacy has been consulted for Zosyn and Vancomcyin dosing.  Plan: Started vancomycin 1 gm IV x 1 in ED. Will continue as vancomycin 1 gm IV Q-dialysis.  Will start patient on Zosyn 3.375 IV every 12 hours. Patient is on hemodialysis for ESRD.  Height: 5\' 7"  (170.2 cm) Weight: 218 lb (98.9 kg) IBW/kg (Calculated) : 61.6  Temp (24hrs), Avg:97 F (36.1 C), Min:97 F (36.1 C), Max:97 F (36.1 C)   Recent Labs Lab 03/01/2016 1333  WBC 17.3*  CREATININE 2.67*  LATICACIDVEN 2.3*    Estimated Creatinine Clearance: 20.6 mL/min (by C-G formula based on SCr of 2.67 mg/dL (H)).    Allergies  Allergen Reactions  . Celebrex [Celecoxib] Hives    Antimicrobials this admission: 9/14 Zosyn >>  9/14 Vancomycin >>   Dose adjustments this admission:   Microbiology results: 9/14 BCx: pending  Thank you for allowing pharmacy to be a part of this patient's care.  Rosland Riding A. [place], [place].D., BCPS Clinical Pharmacist 03/14/2016 6:07 PM

## 2016-03-08 NOTE — ED Notes (Signed)
MD at bedside. 

## 2016-03-08 NOTE — ED Notes (Signed)
Called pharmacy to have Neo sent up ASAP.

## 2016-03-08 NOTE — Procedures (Signed)
Intubation Procedure Note Jarlene Acierno [id number] [birth year]  Procedure: Intubation Indications: Respiratory insufficiency  Procedure Details Consent: Risks of procedure as well as the alternatives and risks of each were explained to the (patient/caregiver).  Consent for procedure obtained. Time Out: Verified patient identification, verified procedure, site/side was marked, verified correct patient position, special equipment/implants available, medications/allergies/relevent history reviewed, required imaging and test results available.  Performed  Drugs: 2mcg Fentanyl, 2 mg Versed,  , 50 mg Rocuronium. DL # 3  blade. Grade 1view. ET tube visualized passing through vocal cords. Following intubation:  positive color change on ETCO2, condensation seen in endotracheal tube, equal breath sounds bilaterally.  Evaluation Hemodynamic Status: BP stable throughout; O2 sats: stable throughout Patient's Current Condition: stable Complications: No apparent complications Patient did tolerate procedure well. Chest X-ray ordered to verify placement.  CXR: pending.  Dr. Ashby Dawes was present during intubation.  [facility]

## 2016-03-08 NOTE — ED Provider Notes (Addendum)
[facility] Emergency Department Provider Note ____________________________________________   I have reviewed the triage vital signs and the triage nursing note.  HISTORY  Chief Complaint fluid overload   Historian L5 caveat, patient unable to provide history due to poor historian and severity of illness  HPI Teresa Lutz is a 81 y.o. female dialysis patient sent in for low O2 sat while receiving dialysis, and hypotension. Nursing home had reportedly stated that she had some sleepiness and when she arrived at dialysis and was hooked up she became severely hypotensive into the 40s. She was given 400 cc of fluid and on arrival was systolic of [id number].  She was 6 L nasal cannula in the mid 80s. It's unclear to me if she wears home O2.  No report of her complaining of chest pain.    Past Medical History:  Diagnosis Date  . Anemia of chronic disease   . Arthritis 1990  . Breast cancer ([place]) 2012   Right stage 1  . Chronic atrial fibrillation ([place]) 2014   a. Eliquis 5 mg bid; CHADSVASc 5 (CHF, HTN, age x 2, female)  . Chronic diastolic CHF (congestive heart failure) (HCC)    a. EF 60-65% by echo in 04/2015  . CKD (chronic kidney disease), stage IV ([place])   . Goiter, specified as simple   . Hemorrhoid   . Hyperlipidemia   . Hypertension 1960  . Hypopotassemia   . Lower extremity edema   . Malignant neoplasm of upper-inner quadrant of female breast ([place])    right  . Pulmonary HTN ([place])    a. PA peak pressure 41 mmHg by echo in 04/2015  . Ulcer 1980  . Unspecified hypothyroidism   . Unspecified vitamin D deficiency     Patient Active Problem List   Diagnosis Date Noted  . Acute kidney failure, unspecified ([place])   . Perforated diverticulum of large intestine   . Free intraperitoneal air   . Atrial fibrillation with rapid ventricular response ([place])   . Hypertensive heart disease 08/24/2015  . Pressure ulcer 08/23/2015  . Acute on chronic diastolic  (congestive) heart failure ([place]) 08/23/2015  . End stage renal disease ([place])   . Generalized edema   . Hypoalbuminemia due to protein-calorie malnutrition ([place])   . Absolute anemia   . Atrial fibrillation with RVR ([place]) 08/22/2015  . CKD (chronic kidney disease), stage IV ([place])   . Hyperlipidemia   . Lower extremity edema   . Chronic atrial fibrillation ([place])   . Chronic diastolic CHF (congestive heart failure) ([place])   . Pulmonary HTN ([place])   . Anemia of chronic disease   . Obstructive sleep apnea 02/17/2015  . Breast cancer, invasive mammary. T1,N0,M0. ER/PR pos, Her 2 neg 02/08/2014  . Hypertension     Past Surgical History:  Procedure Laterality Date  . AV FISTULA PLACEMENT Left 09/02/2015   Procedure: INSERTION OF ARTERIOVENOUS (AV) GORE-TEX GRAFT ARM;  Surgeon: Katha Cabal, MD;  Location: [facility] ORS;  Service: Vascular;  Laterality: Left;  . BREAST LUMPECTOMY Right 50 years ago  . BREAST LUMPECTOMY Right 2012   invasive   . CARDIAC CATHETERIZATION  06/2010   [facility]: Mild nonobstructive coronary artery disease with normal ejection fraction.  [name] CARDIOVERSION    . COLONOSCOPY  2011  . mammosite balloon placement  Right 11/2010  . PERIPHERAL VASCULAR CATHETERIZATION N/A 08/29/2015   Procedure: Dialysis/Perma Catheter Insertion;  Surgeon: Algernon Huxley, MD;  Location: [facility];  Service: Cardiovascular;  Laterality: N/A;  . PERIPHERAL VASCULAR CATHETERIZATION N/A 02/20/2016   Procedure: Dialysis/Perma Catheter Removal;  Surgeon: Algernon Huxley, MD;  Location: [facility];  Service: Cardiovascular;  Laterality: N/A;    Prior to Admission medications   Medication Sig Start Date End Date Taking? Authorizing Provider  acetaminophen (TYLENOL) 325 MG tablet Take 325 mg by mouth every 6 (six) hours as needed for mild pain.    Historical Provider, MD  amiodarone (PACERONE) 200 MG tablet Take 1 tablet (200 mg total) by mouth daily. 02/24/16   Wellington Hampshire, MD  apixaban  (ELIQUIS) 2.5 MG TABS tablet Take 2.5 mg by mouth 2 (two) times daily.     Historical Provider, MD  b complex-vitamin c-folic acid (NEPHRO-VITE) 0.8 MG TABS tablet Take 1 tablet by mouth daily.    Historical Provider, MD  Besifloxacin HCl 0.6 % SUSP Place 1 drop into the left eye See admin instructions. Instill 4 times daily every 1 month starting on the last day of month for 2 days    Historical Provider, MD  carvedilol (COREG) 3.125 MG tablet Take 1 tablet (3.125 mg total) by mouth 2 (two) times daily with a meal. 09/08/15   Hillary Bow, MD  ferrous sulfate 325 (65 FE) MG tablet Take 325 mg by mouth daily.     Historical Provider, MD  guaiFENesin-codeine 100-10 MG/5ML syrup Take 5 mLs by mouth every 6 (six) hours as needed for cough. Patient taking differently: Take 5 mLs by mouth every 4 (four) hours as needed for cough.  09/08/15   Hillary Bow, MD  letrozole (FEMARA) 2.5 MG tablet Take 2.5 mg by mouth daily.     Historical Provider, MD  levothyroxine (SYNTHROID, LEVOTHROID) 25 MCG tablet Take 25 mcg by mouth daily.     Historical Provider, MD  midodrine (PROAMATINE) 10 MG tablet Take 1 tablet (10 mg total) by mouth 3 (three) times a week. On Tue-Thur-Sat prior to Dialysis Patient taking differently: Take 10 mg by mouth 3 (three) times a week. Every Tuesday, Thursday, and Saturday, before dialysis 09/08/15   Hillary Bow, MD  mirtazapine (REMERON) 15 MG tablet Take 15 mg by mouth at bedtime.  02/21/16   Historical Provider, MD  mupirocin ointment (BACTROBAN) 2 % Place 1 application into the nose 3 (three) times daily. Apply to left great toe    Historical Provider, MD  sertraline (ZOLOFT) 50 MG tablet Take 50 mg by mouth daily.  02/06/16   Historical Provider, MD  sevelamer carbonate (RENVELA) 800 MG tablet Take 800 mg by mouth 3 (three) times daily with meals.    Historical Provider, MD  Skin Protectants, Misc. (CALAZIME SKIN PROTECTANT EX) Apply 1 application topically 2 (two) times daily. Apply  to sacral/buttocks    Historical Provider, MD    Allergies  Allergen Reactions  . Celebrex [Celecoxib] Hives    Family History  Problem Relation Age of Onset  . Cancer Mother     ovarian  . Hypertension Mother   . Stroke Father   . Cancer Brother     colon    Social History Social History  Substance Use Topics  . Smoking status: Never Smoker  . Smokeless tobacco: Never Used  . Alcohol use No    Review of Systems Unable to obtain due to patient's altered mental status and severity of illness   PHYSICAL EXAM:  VITAL SIGNS: ED Triage Vitals  Enc Vitals Group     BP 02/29/2016 1319 102/75  Pulse Rate 03/21/2016 1319 85     Resp 03/21/2016 1319 19     Temp 03/22/2016 1357 97 F (36.1 C)     Temp Source 03/21/2016 1357 Axillary     SpO2 03/01/2016 1326 100 %     Weight 03/18/2016 1320 218 lb (98.9 kg)     Height 03/15/2016 1320 5\' 7"  (1.702 m)     Head Circumference --      Peak Flow --      Pain Score 03/23/2016 1326 5     Pain Loc --      Pain Edu? --      Excl. in [place]? --      Constitutional: Alert to voice and follows some commands. No distress, but also somewhat altered.  HEENT   Head: Normocephalic and atraumatic.      Eyes: Conjunctivae are normal. PERRL. Normal extraocular movements.      Ears:         Nose: No congestion/rhinnorhea.   Mouth/Throat: Mucous membranes are moist.   Neck: No stridor. Cardiovascular/Chest: Normal rate, regular rhythm.  No murmurs, rubs, or gallops. Respiratory: Moderate decreased air movement throughout with no wheezing or appreciable rales or rhonchi. Gastrointestinal: Soft. No distention, no guarding, no rebound. Nontender.   Genitourinary/rectal:Deferred Musculoskeletal: Right leg looks shortened and externally rotated with mild tenderness in the right head moderate edema in the lower extremities, 3+ bilateral. Neurologic:  No obvious facial droop. Patient is only speaking a few words, but does seem to be following commands.  No obvious focal neurologic deficits. Skin:  Skin is warm, dry and intact. No rash noted. Psychiatric: No agitation.   ____________________________________________  LABS (pertinent positives/negatives)  Labs Reviewed  COMPREHENSIVE METABOLIC PANEL - Abnormal; Notable for the following:       Result Value   Potassium 3.4 (*)    Chloride 98 (*)    CO2 35 (*)    Glucose, Bld 109 (*)    BUN 21 (*)    Creatinine, Ser 2.67 (*)    Calcium 8.5 (*)    Total Protein 4.3 (*)    Albumin 1.5 (*)    ALT 63 (*)    GFR calc non Af Amer 16 (*)    GFR calc Af Amer 18 (*)    All other components within normal limits  TROPONIN I - Abnormal; Notable for the following:    Troponin I 0.04 (*)    All other components within normal limits  LACTIC ACID, PLASMA - Abnormal; Notable for the following:    Lactic Acid, Venous 2.3 (*)    All other components within normal limits  CBC WITH DIFFERENTIAL/PLATELET - Abnormal; Notable for the following:    WBC 17.3 (*)    RBC 3.07 (*)    Hemoglobin 9.6 (*)    HCT 29.8 (*)    RDW 21.2 (*)    Platelets 93 (*)    Neutro Abs 15.6 (*)    Lymphs Abs 0.7 (*)    All other components within normal limits  PROTIME-INR - Abnormal; Notable for the following:    Prothrombin Time 24.1 (*)    All other components within normal limits  CULTURE, BLOOD (SINGLE)  LACTIC ACID, PLASMA  BLOOD GAS, VENOUS    ____________________________________________    EKG I, Lisa Roca, MD, the attending physician have personally viewed and interpreted all ECGs.  74 bpm. Normal sinus rhythm. Nonspecific intraventricular conduction delay. Normal axis. Nonspecific ST and T-wave ____________________________________________  RADIOLOGY All Xrays  were viewed by me. Imaging interpreted by Radiologist.  Chest x-ray portable one view:  IMPRESSION: 1. Moderate-sized right pleural effusion. 2. Bilateral atelectasis and possible pneumonia. 3. Mild cardiomegaly with mild  progression.   Right hip xray: IMPRESSION: 1. No fracture or dislocation. 2. Lower lumbar spine degenerative changes. 3. Extensive atheromatous arterial calcifications. __________________________________________  PROCEDURES  Procedure(s) performed: Peripheral EJ - obtained by myself.  20guage.  Critical Care performed: CRITICAL CARE Performed by: Lisa Roca   Total critical care time: 30 minutes  Critical care time was exclusive of separately billable procedures and treating other patients.  Critical care was necessary to treat or prevent imminent or life-threatening deterioration.  Critical care was time spent personally by me on the following activities: development of treatment plan with patient and/or surrogate as well as nursing, discussions with consultants, evaluation of patient's response to treatment, examination of patient, obtaining history from patient or surrogate, ordering and performing treatments and interventions, ordering and review of laboratory studies, ordering and review of radiographic studies, pulse oximetry and re-evaluation of patient's condition.   ____________________________________________   ED COURSE / ASSESSMENT AND PLAN  Pertinent labs & imaging results that were available during my care of the patient were reviewed by me and considered in my medical decision making (see chart for details).   Ms. Chura was brought in for hypoxia and hypotension while receiving dialysis although it sounds like she did not receive much dialysis before this episode occurred. She is still not moving a lot of air, and on a nonrebreather she was brought up to 95+ percent. No reported illness, but x-ray concerning for possible pneumonia. Given her elevated white blood cell count, I am going to cover for hospital-acquired pneumonia as she states the nursing facility.  She had hypotension, but was given 400 cc of fluid and has maintained above systolic [id number]. I'm going to  treat with fluids only as needed for significant hypotension given her also tenuous pulmonary overload scenario.  Nephrologist, Dr. Marita Snellen did stop by and once patient to go immediately for dialysis today.  I spoke with the intensivist for ICU admission.   CONSULTATIONS: Intensivist for admission.  Patient / Family / Caregiver informed of clinical course, medical decision-making process, and agree with plan.  Addended to include patient's blood pressure dropped to 93 systolic. I did place a left sided 20-gauge EJ, and ordered 2 cc fluid bolus with goal pressure above 100. I had a significant discussion with both the patient and her only daughter about my concern about sepsis and possible need for intubation. They are interested in the patient remaining full code and okay with intubation if it needs be. ___________________________________________   FINAL CLINICAL IMPRESSION(S) / ED DIAGNOSES   Final diagnoses:  Hypoxia  Pleural effusion, right  Healthcare-associated pneumonia              Note: This dictation was prepared with Dragon dictation. Any transcriptional errors that result from this process are unintentional    Lisa Roca, MD 02/29/2016 [place], MD 02/29/2016 [phone]

## 2016-03-08 NOTE — ED Triage Notes (Signed)
Pt comes into the ED via [facility]  Where she was accessed for dialysis.  Patients BP was 50/40.  BP increased to 80/40.  Patient has severe pitting edema throughout the body.  Alert and oriented and c/o right hip pain where she has shortening and external rotation. Patient had a fall on Thursday.  Originally at 80% on 4 L [place].  Patient bumped to 15 L and increased to 92%.  NSR.

## 2016-03-08 NOTE — ED Notes (Signed)
Attempted to call report to ICU RN, RN unavailable at this time and will return my call.

## 2016-03-08 NOTE — ED Notes (Signed)
Admitting MD at bedside.

## 2016-03-08 NOTE — H&P (Signed)
[facility] Consultation     ASSESSMENT/PLAN   81 yo female with history of ESRD, now with septic shock and hypotension. Complicated by volume overload with acute resp failure.   PULMONARY A:Acute respiratory failure.  OSA. P:   -Review of CXR images show mild pulmonary edema with moderate right pleural effusion.  -Continue Bipap and/or oxygen. Will need to monitor for intubation.  --Discussed with patient and daughter, pt is full code, ok with intubation. --Bipap at night when recovered.    CARDIOVASCULAR A: Severe septic shock with hypotension.  Volume overload acute on chronic.  P:  Given ESRD, will limit IVF, start pressors if needed.   RENAL A:  ESRD on HD.  P:   Continue HD per nephro.   GASTROINTESTINAL A:  -- P:     HEMATOLOGIC A:  Anemia of chronic disease.  P:  Continue to monitor CBC.  Transfuse as needed.   INFECTIOUS A:  Septic shock.  P:   Check Blood cx, monitor cultures. Source uncertain.   Micro/culture results:  BCx2 -- UC -- Sputum--  Antibiotics: Zosyn 02/24/2016>> Vancomycin. 03/14/2016>>  ENDOCRINE A:  SSI P:     NEUROLOGIC A:   P:   -   MAJOR EVENTS/TEST RESULTS:   Best Practices  DVT Prophylaxis: Heparin SQ. GI Prophylaxis: Famotidine.    ---------------------------------------  ---------------------------------------   Name: Teresa Lutz MRN: [id number] DOB: 1934/10/24    ADMISSION DATE:  03/10/2016   CHIEF COMPLAINT:  hypotension.    HISTORY OF PRESENT ILLNESS:   The patient is a 81 yo female with a history of ESRD. She had her permacath removed on 8/28 due to working dialysis fistula. She has a history of afib and diast CHF, anemia of chronic disease, sleep apnea on CPAP, hypothyroidism, chronic leg edema and history of breast cancer. She was recently seen in the ER due to fall and hitting of the head, nothing significant was found, then the patient was discharged.   She was seen  at HD today and it was noted that she was hypotensive. Her BP was 50/40, she was not dialyzed and sent to the ER. While in the ER she has been persistently hypertensive and required boluses of fluid to keep her BP elevated.  During that time she has become volume overloaded and she is now requiring a 100% NRB.   Currently on my eval, she is not able to give a history due to dyspnea, she is in trendelenburg to keep her BP elevated. She is not able to give history due to dyspnea and lying nearly upside down.   PAST MEDICAL HISTORY :  Past Medical History:  Diagnosis Date  . Anemia of chronic disease   . Arthritis 1990  . Breast cancer ([place]) 2012   Right stage 1  . Chronic atrial fibrillation ([place]) 2014   a. Eliquis 5 mg bid; CHADSVASc 5 (CHF, HTN, age x 2, female)  . Chronic diastolic CHF (congestive heart failure) (HCC)    a. EF 60-65% by echo in 04/2015  . CKD (chronic kidney disease), stage IV ([place])   . Goiter, specified as simple   . Hemorrhoid   . Hyperlipidemia   . Hypertension 1960  . Hypopotassemia   . Lower extremity edema   . Malignant neoplasm of upper-inner quadrant of female breast ([place])    right  . Pulmonary HTN ([place])    a. PA peak pressure 41 mmHg by echo in 04/2015  . Ulcer 1980  .  Unspecified hypothyroidism   . Unspecified vitamin D deficiency    Past Surgical History:  Procedure Laterality Date  . AV FISTULA PLACEMENT Left 09/02/2015   Procedure: INSERTION OF ARTERIOVENOUS (AV) GORE-TEX GRAFT ARM;  Surgeon: Katha Cabal, MD;  Location: [facility] ORS;  Service: Vascular;  Laterality: Left;  . BREAST LUMPECTOMY Right 50 years ago  . BREAST LUMPECTOMY Right 2012   invasive   . CARDIAC CATHETERIZATION  06/2010   [facility]: Mild nonobstructive coronary artery disease with normal ejection fraction.  [name] CARDIOVERSION    . COLONOSCOPY  2011  . mammosite balloon placement  Right 11/2010  . PERIPHERAL VASCULAR CATHETERIZATION N/A 08/29/2015   Procedure: Dialysis/Perma Catheter  Insertion;  Surgeon: Algernon Huxley, MD;  Location: [place] [facility] LAB;  Service: Cardiovascular;  Laterality: N/A;  . PERIPHERAL VASCULAR CATHETERIZATION N/A 02/20/2016   Procedure: Dialysis/Perma Catheter Removal;  Surgeon: Algernon Huxley, MD;  Location: [place] [facility];  Service: Cardiovascular;  Laterality: N/A;   Prior to Admission medications   Medication Sig Start Date End Date Taking? Authorizing Provider  acetaminophen (TYLENOL) 325 MG tablet Take 325 mg by mouth every 6 (six) hours as needed for mild pain.    Historical Provider, MD  amiodarone (PACERONE) 200 MG tablet Take 1 tablet (200 mg total) by mouth daily. 02/24/16   Wellington Hampshire, MD  apixaban (ELIQUIS) 2.5 MG TABS tablet Take 2.5 mg by mouth 2 (two) times daily.     Historical Provider, MD  b complex-vitamin c-folic acid (NEPHRO-VITE) 0.8 MG TABS tablet Take 1 tablet by mouth daily.    Historical Provider, MD  Besifloxacin HCl 0.6 % SUSP Place 1 drop into the left eye See admin instructions. Instill 4 times daily every 1 month starting on the last day of month for 2 days    Historical Provider, MD  carvedilol (COREG) 3.125 MG tablet Take 1 tablet (3.125 mg total) by mouth 2 (two) times daily with a meal. 09/08/15   Hillary Bow, MD  ferrous sulfate 325 (65 FE) MG tablet Take 325 mg by mouth daily.     Historical Provider, MD  guaiFENesin-codeine 100-10 MG/5ML syrup Take 5 mLs by mouth every 6 (six) hours as needed for cough. Patient taking differently: Take 5 mLs by mouth every 4 (four) hours as needed for cough.  09/08/15   Hillary Bow, MD  letrozole (FEMARA) 2.5 MG tablet Take 2.5 mg by mouth daily.     Historical Provider, MD  levothyroxine (SYNTHROID, LEVOTHROID) 25 MCG tablet Take 25 mcg by mouth daily.     Historical Provider, MD  midodrine (PROAMATINE) 10 MG tablet Take 1 tablet (10 mg total) by mouth 3 (three) times a week. On Tue-Thur-Sat prior to Dialysis Patient taking differently: Take 10 mg by mouth 3 (three) times  a week. Every Tuesday, Thursday, and Saturday, before dialysis 09/08/15   Hillary Bow, MD  mirtazapine (REMERON) 15 MG tablet Take 15 mg by mouth at bedtime.  02/21/16   Historical Provider, MD  mupirocin ointment (BACTROBAN) 2 % Place 1 application into the nose 3 (three) times daily. Apply to left great toe    Historical Provider, MD  sertraline (ZOLOFT) 50 MG tablet Take 50 mg by mouth daily.  02/06/16   Historical Provider, MD  sevelamer carbonate (RENVELA) 800 MG tablet Take 800 mg by mouth 3 (three) times daily with meals.    Historical Provider, MD  Skin Protectants, Misc. (CALAZIME SKIN PROTECTANT EX) Apply 1 application topically 2 (  two) times daily. Apply to sacral/buttocks    Historical Provider, MD   Allergies  Allergen Reactions  . Celebrex [Celecoxib] Hives    FAMILY HISTORY:  Family History  Problem Relation Age of Onset  . Cancer Mother     ovarian  . Hypertension Mother   . Stroke Father   . Cancer Brother     colon   SOCIAL HISTORY:  reports that she has never smoked. She has never used smokeless tobacco. She reports that she does not drink alcohol or use drugs.  REVIEW OF SYSTEMS:   Unable to provide due to critical illness.    VITAL SIGNS: Temp:  [97 F (36.1 C)] 97 F (36.1 C) (09/14 1357) Pulse Rate:  [70-85] 70 (09/14 1730) Resp:  [12-19] 13 (09/14 1730) BP: (93-113)/(46-75) 97/46 (09/14 1730) SpO2:  [95 %-100 %] 100 % (09/14 1730) Weight:  [218 lb (98.9 kg)] 218 lb (98.9 kg) (09/14 1320) HEMODYNAMICS:   VENTILATOR SETTINGS:   INTAKE / OUTPUT:  Intake/Output Summary (Last 24 hours) at 03/02/2016 1749 Last data filed at 03/18/2016 1734  Gross per 24 hour  Intake              200 ml  Output                0 ml  Net              200 ml    Physical Examination:   VS: BP (!) 97/46   Pulse 70   Temp 97 F (36.1 C) (Axillary)   Resp 13   Ht 5\' 7"  (1.702 m)   Wt 218 lb (98.9 kg)   SpO2 100%   BMI 34.14 kg/m   General Appearance: Moderate  respiratory distress  Neuro:without focal findings, mental status reduced.  HEENT: PERRLA, EOM intact, no ptosis. Pulmonary: bilateral crackles.  CardiovascularNormal S1,S2.  No m/r/g.    Abdomen: Benign, Soft, non-tender. Renal:  No costovertebral tenderness  GU:  Not performed at this time. Endoc: No evident thyromegaly, no signs of acromegaly. Skin:   warm, no rashes, no ecchymosis  Extremities: normal, no cyanosis, clubbing, no edema, warm with normal capillary refill.    LABS: Reviewed   LABORATORY PANEL:   CBC  Recent Labs Lab 03/04/2016 1333  WBC 17.3*  HGB 9.6*  HCT 29.8*  PLT 93*    Chemistries   Recent Labs Lab 03/06/2016 1333  NA 140  K 3.4*  CL 98*  CO2 35*  GLUCOSE 109*  BUN 21*  CREATININE 2.67*  CALCIUM 8.5*  AST 35  ALT 63*  ALKPHOS 122  BILITOT 0.7    No results for input(s): GLUCAP in the last 168 hours. No results for input(s): PHART, PCO2ART, PO2ART in the last 168 hours.  Recent Labs Lab 03/09/2016 1333  AST 35  ALT 63*  ALKPHOS 122  BILITOT 0.7  ALBUMIN 1.5*    Cardiac Enzymes  Recent Labs Lab 03/03/2016 1333  TROPONINI 0.04*    RADIOLOGY:  Dg Chest Port 1 View  Result Date: 03/11/2016 CLINICAL DATA:  Shortness of breath. [place] out of bed onto the floor 1 week ago. EXAM: PORTABLE CHEST 1 VIEW COMPARISON:  08/22/2015. FINDINGS: Mildly enlarged cardiac silhouette with a mild increase in size. Interval moderate-sized right pleural effusion. Interval patchy and linear density in both mid lung zones. Interval airspace opacity at the medial left lung base. Diffuse osteopenia. Thoracic spine degenerative changes. IMPRESSION: 1. Moderate-sized right pleural effusion. 2. Bilateral atelectasis  and possible pneumonia. 3. Mild cardiomegaly with mild progression. Electronically Signed   By: Claudie Revering M.D.   On: 03/03/2016 14:54   Dg Hip Unilat W Or Wo Pelvis 2-3 Views Right  Result Date: 03/01/2016 CLINICAL DATA:  Right hip pain after  falling out of bed 1 week ago. EXAM: DG HIP (WITH OR WITHOUT PELVIS) 2-3V RIGHT COMPARISON:  None. FINDINGS: Diffuse osteopenia. No fracture or dislocation seen. Extensive arterial calcifications. Small calcified uterine fibroids on the right. Lower lumbar spine degenerative changes. IMPRESSION: 1. No fracture or dislocation. 2. Lower lumbar spine degenerative changes. 3. Extensive atheromatous arterial calcifications. Electronically Signed   By: Claudie Revering M.D.   On: 03/01/2016 14:51   Mm Outside Films Mammo  Result Date: 03/07/2016 This examination belongs to an outside facility and is stored here for comparison purposes only.  Contact the originating outside institution for any associated report or interpretation.      --Marda Stalker, MD.  Board Certified in Internal Medicine, Pulmonary Medicine, [place], and Sleep Medicine.  ICU Pager [phone] [facility] Office Number: [phone]  Patricia Pesa, M.D.  Vilinda Boehringer, M.D.  Merton Border, M.D   03/14/2016, 5:49 PM  [place].  I have personally obtained a history, examined the patient, evaluated laboratory and imaging results, formulated the assessment and plan and placed orders. The Patient requires high complexity decision making for assessment and support, frequent evaluation and titration of therapies, application of advanced monitoring technologies and extensive interpretation of multiple databases. The patient has critical illness that could lead imminently to failure of 1 or more organ systems and requires the highest level of physician preparedness to intervene.  Critical Care Time devoted to patient care services described in this note is 60 minutes and is exclusive of time spent in procedures.

## 2016-03-08 NOTE — ED Notes (Signed)
Spoke with Admission MD in regards to patients pressure continuously dropping.  Informed him no orders were in for fluids to keep BP elevated.  See [facility]

## 2016-03-08 NOTE — ED Notes (Signed)
Patient transported to X-ray 

## 2016-03-08 NOTE — Progress Notes (Signed)
eLink Physician-Brief Progress Note Patient Name: Teresa Lutz DOB: Nov 24, 1934 MRN: [id number]   Date of Service  03/23/2016  HPI/Events of Note  RN notified of borderline BP w/ hypotension. Has PIV access only.  eICU Interventions  Peripheral Neo-Synephrine low dose until additional access can be established.     Intervention Category Intermediate Interventions: Hypotension - evaluation and management  Tera Partridge 03/10/2016, 8:00 PM

## 2016-03-08 NOTE — ED Notes (Signed)
Dialysis RN at bedside to deaccess patient.  

## 2016-03-08 NOTE — ED Notes (Signed)
Respiratory called and informed patient is ready for transport to CCU.

## 2016-03-09 ENCOUNTER — Inpatient Hospital Stay: Payer: Medicare HMO

## 2016-03-09 ENCOUNTER — Encounter (INDEPENDENT_AMBULATORY_CARE_PROVIDER_SITE_OTHER): Payer: Self-pay

## 2016-03-09 DIAGNOSIS — A419 Sepsis, unspecified organism: Secondary | ICD-10-CM

## 2016-03-09 LAB — MRSA PCR SCREENING: MRSA by PCR: NEGATIVE

## 2016-03-09 LAB — CBC
HEMATOCRIT: 27.9 % — AB (ref 35.0–47.0)
HEMOGLOBIN: 9.1 g/dL — AB (ref 12.0–16.0)
MCH: 31.6 pg (ref 26.0–34.0)
MCHC: 32.6 g/dL (ref 32.0–36.0)
MCV: 96.8 fL (ref 80.0–100.0)
PLATELETS: 108 10*3/uL — AB (ref 150–440)
RBC: 2.88 MIL/uL — AB (ref 3.80–5.20)
RDW: 21.6 % — ABNORMAL HIGH (ref 11.5–14.5)
WBC: 19.8 10*3/uL — AB (ref 3.6–11.0)

## 2016-03-09 LAB — BLOOD GAS, ARTERIAL
Acid-Base Excess: 11.7 mmol/L — ABNORMAL HIGH (ref 0.0–2.0)
BICARBONATE: 33 mmol/L — AB (ref 20.0–28.0)
FIO2: 0.5
MECHANICAL RATE: 18
MECHVT: 500 mL
O2 Saturation: 99.8 %
PATIENT TEMPERATURE: 37
PEEP/CPAP: 5 cmH2O
PO2 ART: 198 mmHg — AB (ref 83.0–108.0)
pCO2 arterial: 30 mmHg — ABNORMAL LOW (ref 32.0–48.0)
pH, Arterial: 7.65 (ref 7.350–7.450)

## 2016-03-09 LAB — BASIC METABOLIC PANEL
ANION GAP: 9 (ref 5–15)
BUN: 23 mg/dL — ABNORMAL HIGH (ref 6–20)
CHLORIDE: 97 mmol/L — AB (ref 101–111)
CO2: 31 mmol/L (ref 22–32)
Calcium: 8 mg/dL — ABNORMAL LOW (ref 8.9–10.3)
Creatinine, Ser: 2.85 mg/dL — ABNORMAL HIGH (ref 0.44–1.00)
GFR calc Af Amer: 17 mL/min — ABNORMAL LOW (ref >60)
GFR, EST NON AFRICAN AMERICAN: 15 mL/min — AB (ref >60)
GLUCOSE: 170 mg/dL — AB (ref 65–99)
POTASSIUM: 3 mmol/L — AB (ref 3.5–5.1)
Sodium: 137 mmol/L (ref 135–145)

## 2016-03-09 LAB — GLUCOSE, CAPILLARY
GLUCOSE-CAPILLARY: 102 mg/dL — AB (ref 65–99)
GLUCOSE-CAPILLARY: 110 mg/dL — AB (ref 65–99)
GLUCOSE-CAPILLARY: 112 mg/dL — AB (ref 65–99)
GLUCOSE-CAPILLARY: 122 mg/dL — AB (ref 65–99)
GLUCOSE-CAPILLARY: 130 mg/dL — AB (ref 65–99)
Glucose-Capillary: 96 mg/dL (ref 65–99)
Glucose-Capillary: 124 mg/dL — ABNORMAL HIGH (ref 65–99)

## 2016-03-09 LAB — CORTISOL

## 2016-03-09 LAB — PROCALCITONIN: Procalcitonin: 2.31 ng/mL

## 2016-03-09 MED ORDER — MIDODRINE HCL 5 MG PO TABS
10.0000 mg | ORAL_TABLET | Freq: Three times a day (TID) | ORAL | Status: DC
  Administered 2016-03-09 – 2016-03-12 (×11): 10 mg
  Filled 2016-03-09 (×11): qty 2

## 2016-03-09 MED ORDER — NOREPINEPHRINE BITARTRATE 1 MG/ML IV SOLN: 2.0000 ug/min | INTRAVENOUS | Status: DC

## 2016-03-09 MED ORDER — VASOPRESSIN 20 UNIT/ML IV SOLN
0.0300 [IU]/min | INTRAVENOUS | Status: DC
  Administered 2016-03-09 – 2016-03-10 (×3): 0.03 [IU]/min via INTRAVENOUS
  Filled 2016-03-09 (×3): qty 2

## 2016-03-09 MED ORDER — NOREPINEPHRINE BITARTRATE 1 MG/ML IV SOLN
0.0000 ug/min | INTRAVENOUS | Status: DC
  Administered 2016-03-09: 30 ug/min via INTRAVENOUS
  Filled 2016-03-09 (×2): qty 16

## 2016-03-09 MED ORDER — VITAL HIGH PROTEIN PO LIQD
1000.0000 mL | ORAL | Status: DC
  Administered 2016-03-09: 1000 mL
  Administered 2016-03-09 (×3)
  Administered 2016-03-10: 1000 mL

## 2016-03-09 MED ORDER — MIDAZOLAM HCL 2 MG/2ML IJ SOLN
1.0000 mg | INTRAMUSCULAR | Status: DC | PRN
  Administered 2016-03-09 (×4): 1 mg via INTRAVENOUS
  Filled 2016-03-09 (×3): qty 2

## 2016-03-09 MED ORDER — MIDAZOLAM HCL 2 MG/2ML IJ SOLN
1.0000 mg | INTRAMUSCULAR | Status: AC | PRN
  Administered 2016-03-09 – 2016-03-10 (×3): 1 mg via INTRAVENOUS
  Filled 2016-03-09 (×4): qty 2

## 2016-03-09 MED ORDER — ALBUMIN HUMAN 25 % IV SOLN
25.0000 g | Freq: Once | INTRAVENOUS | Status: AC
  Administered 2016-03-09: 25 g via INTRAVENOUS
  Filled 2016-03-09: qty 100

## 2016-03-09 MED ORDER — NOREPINEPHRINE 4 MG/250ML-% IV SOLN
0.0000 ug/min | INTRAVENOUS | Status: DC
  Administered 2016-03-09: 10 ug/min via INTRAVENOUS
  Filled 2016-03-09 (×2): qty 250

## 2016-03-09 MED ORDER — VANCOMYCIN HCL IN DEXTROSE 1-5 GM/200ML-% IV SOLN: 1000.0000 mg | INTRAVENOUS | Status: DC

## 2016-03-09 MED ORDER — HYDROCORTISONE NA SUCCINATE PF 100 MG IJ SOLR
50.0000 mg | Freq: Four times a day (QID) | INTRAMUSCULAR | Status: DC
  Administered 2016-03-09 – 2016-03-12 (×14): 50 mg via INTRAVENOUS
  Filled 2016-03-09 (×14): qty 2

## 2016-03-09 NOTE — Progress Notes (Signed)
HD STARTED  

## 2016-03-09 NOTE — Progress Notes (Signed)
Hd complete  

## 2016-03-09 NOTE — Progress Notes (Addendum)
Pharmacy Antibiotic Note  Teresa Lutz is a 81 y.o. female admitted on 02/29/2016 with sepsis.  Pharmacy has been consulted for Zosyn dosing.  Plan:   Will continue  patient on Zosyn 3.375 IV every 12 hours. Patient is on hemodialysis for ESRD.  Height: 5\' 5"  (165.1 cm) Weight:  (UNABLE TO OBTAIN ACCURATE WT) IBW/kg (Calculated) : 57  Temp (24hrs), Avg:97.8 F (36.6 C), Min:93.7 F (34.3 C), Max:99.3 F (37.4 C)   Recent Labs Lab 03/22/2016 1333 03/05/2016 1901 03/09/16 0458  WBC 17.3*  --  19.8*  CREATININE 2.67*  --  2.85*  LATICACIDVEN 2.3* 1.4  --     Estimated Creatinine Clearance: 17.7 mL/min (by C-G formula based on SCr of 2.85 mg/dL (H)).    Allergies  Allergen Reactions  . Celebrex [Celecoxib] Hives    Antimicrobials this admission: 9/14 Zosyn >>  9/14 Vancomycin >> 9/15  Dose adjustments this admission:   Microbiology results: 9/14 BCx: pending  Thank you for allowing pharmacy to be a part of this patient's care.  Larene Beach, PharmD  Clinical Pharmacist 03/09/2016 12:02 PM

## 2016-03-09 NOTE — Progress Notes (Signed)
Central Venous Catheter Insertion Procedure Note - Right Femoral Teresa Lutz [id number] 1934/12/29  Procedure: Insertion of Central Venous Catheter Indications: Assessment of intravascular volume, Drug and/or fluid administration and Frequent blood sampling  Procedure Details Consent: Risks of procedure as well as the alternatives and risks of each were explained to the (patient/caregiver).  Consent for procedure obtained. Time Out: Verified patient identification, verified procedure, site/side was marked, verified correct patient position, special equipment/implants available, medications/allergies/relevent history reviewed, required imaging and test results available.  Performed  Maximum sterile technique was used including antiseptics, cap, gloves, gown, hand hygiene, mask and sheet. Skin prep: Chlorhexidine; local anesthetic administered A antimicrobial bonded/coated triple lumen catheter was placed in the right femoral vein due to patient being a dialysis patient using the Seldinger technique.  Evaluation Blood flow good Complications: No apparent complications Patient did tolerate procedure well.   Procedure performed under direct ultrasound guidance for real time vessel cannulation.       Bincy Varughese,AG-ACNP Pulmonary & Critical Care   I was immediately available for procedure   Corrin Parker, M.D.  [facility]  Medical Director [place] Director [place] Department

## 2016-03-09 NOTE — H&P (Signed)
[facility] Consultation      Name: Teresa Lutz MRN: [id number] DOB: 08-08-1934    ADMISSION DATE:  03/17/2016   CHIEF COMPLAINT:  hypotension.    HISTORY OF PRESENT ILLNESS:   The patient is a 81 yo female with a history of ESRD. She had her permacath removed on 8/28 due to working dialysis fistula. She has a history of afib and diast CHF, anemia of chronic disease, sleep apnea on CPAP, hypothyroidism, chronic leg edema and history of breast cancer. She was recently seen in the ER due to fall and hitting of the head, nothing significant was found, then the patient was discharged.   She was seen at HD today and it was noted that she was hypotensive. Her BP was 50/40, she was not dialyzed and sent to the ER. While in the ER she has been persistently hypertensive and required boluses of fluid to keep her BP elevated.  During that time she has become volume overloaded and she is now requiring a 100% NRB.   Currently on my eval, she is not able to give a history due to dyspnea, she is in trendelenburg to keep her BP elevated. She is not able to give history due to dyspnea and lying nearly upside down.    Hosp Course 9/15 remains intubated,sedated, full vent support  Allergies  Allergen Reactions  . Celebrex [Celecoxib] Hives   REVIEW OF SYSTEMS:   Unable to provide due to critical illness.    VITAL SIGNS: Temp:  [93.7 F (34.3 C)-99.3 F (37.4 C)] 99.1 F (37.3 C) (09/15 0805) Pulse Rate:  [29-85] 71 (09/15 0805) Resp:  [8-24] 16 (09/15 0805) BP: (55-146)/(40-78) 97/53 (09/15 0635) SpO2:  [89 %-100 %] 100 % (09/15 0805) FiO2 (%):  [40 %-100 %] 40 % (09/15 0746) Weight:  [195 lb 12.3 oz (88.8 kg)-218 lb (98.9 kg)] 196 lb 13.9 oz (89.3 kg) (09/15 0521) HEMODYNAMICS:   VENTILATOR SETTINGS: Vent Mode: PRVC FiO2 (%):  [40 %-100 %] 40 % Set Rate:  [18 bmp] 18 bmp Vt Set:  [450 mL-500 mL] 450 mL PEEP:  [5 cmH20] 5 cmH20 INTAKE /  OUTPUT:  Intake/Output Summary (Last 24 hours) at 03/09/16 0810 Last data filed at 03/09/16 0418  Gross per 24 hour  Intake             1255 ml  Output                0 ml  Net             1255 ml    Physical Examination:   VS: BP (!) 97/53   Pulse 71   Temp 99.1 F (37.3 C)   Resp 16   Ht 5\' 5"  (1.651 m)   Wt 196 lb 13.9 oz (89.3 kg)   SpO2 100%   BMI 32.76 kg/m   General Appearance:gcs<8T, intubated Neuro:without focal findings, mental status reduced.  HEENT: PERRLA, EOM intact, no ptosis. Pulmonary: bilateral crackles.  CardiovascularNormal S1,S2.  No m/r/g.    Abdomen: Benign, Soft, non-tender. Renal:  No costovertebral tenderness  GU:  Not performed at this time. Endoc: No evident thyromegaly, no signs of acromegaly. Skin:   warm, no rashes, no ecchymosis  Extremities: normal, no cyanosis, clubbing, no edema, warm with normal capillary refill.    LABS: Reviewed   LABORATORY PANEL:   CBC  Recent Labs Lab 03/09/16 0458  WBC 19.8*  HGB 9.1*  HCT 27.9*  PLT 108*  Chemistries   Recent Labs Lab 03/07/2016 1333 03/09/16 0458  NA 140 137  K 3.4* 3.0*  CL 98* 97*  CO2 35* 31  GLUCOSE 109* 170*  BUN 21* 23*  CREATININE 2.67* 2.85*  CALCIUM 8.5* 8.0*  AST 35  --   ALT 63*  --   ALKPHOS 122  --   BILITOT 0.7  --      Recent Labs Lab 02/24/2016 2242 03/09/16 0051 03/09/16 0357 03/09/16 0726  GLUCAP 112* 112* 130* 102*    Recent Labs Lab 03/09/16 0052  PHART 7.65*  PCO2ART 30*  PO2ART 198*    Recent Labs Lab 02/26/2016 1333  AST 35  ALT 63*  ALKPHOS 122  BILITOT 0.7  ALBUMIN 1.5*    Cardiac Enzymes  Recent Labs Lab 03/16/2016 1901  TROPONINI 0.13*    RADIOLOGY:  Dg Chest 1 View  Result Date: 03/17/2016 CLINICAL DATA:  81 year old female intubated. Initial encounter. EXAM: CHEST 1 VIEW COMPARISON:  1431 hours today. FINDINGS: Portable AP supine view at 2120 hours. Endotracheal tube tip about 10 mm above the carina. Similar  lung volumes. Stable cardiac size and mediastinal contours. Veiling opacity persists in the right lung suggesting pleural effusion. Smaller veiling opacity now on the left lung. No definite pneumothorax on this supine view. IMPRESSION: 1. Intubated with endotracheal tube tip about 10 mm from the carina. 2. Right greater than left pleural effusions. Associated right lung base atelectasis or consolidation. Electronically Signed   By: Genevie Ann M.D.   On: 02/27/2016 22:15   Dg Abd 1 View  Result Date: 03/09/2016 CLINICAL DATA:  Orogastric tube placement EXAM: ABDOMEN - 1 VIEW COMPARISON:  None. FINDINGS: The orogastric tube extends into the stomach, coiled in the fundus. IMPRESSION: OG tube extends into the stomach. Electronically Signed   By: Andreas Newport M.D.   On: 02/26/2016 23:55   Dg Chest Port 1 View  Result Date: 03/09/2016 CLINICAL DATA:  Dyspnea. EXAM: PORTABLE CHEST 1 VIEW COMPARISON:  Yesterday at 2120 hour FINDINGS: Endotracheal tube is 1.4 cm from the carina. Enteric tube in place, tip and side port below the diaphragm. Right pleural effusion is similar, left pleural effusion has improved. Associated bibasilar opacities, favoring atelectasis. Cardiomediastinal contours are unchanged. Chronic degenerative change of both shoulders. IMPRESSION: Improved left pleural effusion. Unchanged right pleural effusion. Bibasilar opacities are again seen. Endotracheal and enteric tubes in place. Electronically Signed   By: Jeb Levering M.D.   On: 03/09/2016 04:10   Dg Chest Port 1 View  Result Date: 02/29/2016 CLINICAL DATA:  Shortness of breath. [place] out of bed onto the floor 1 week ago. EXAM: PORTABLE CHEST 1 VIEW COMPARISON:  08/22/2015. FINDINGS: Mildly enlarged cardiac silhouette with a mild increase in size. Interval moderate-sized right pleural effusion. Interval patchy and linear density in both mid lung zones. Interval airspace opacity at the medial left lung base. Diffuse osteopenia.  Thoracic spine degenerative changes. IMPRESSION: 1. Moderate-sized right pleural effusion. 2. Bilateral atelectasis and possible pneumonia. 3. Mild cardiomegaly with mild progression. Electronically Signed   By: Claudie Revering M.D.   On: 03/12/2016 14:54   Dg Hip Unilat W Or Wo Pelvis 2-3 Views Right  Result Date: 02/25/2016 CLINICAL DATA:  Right hip pain after falling out of bed 1 week ago. EXAM: DG HIP (WITH OR WITHOUT PELVIS) 2-3V RIGHT COMPARISON:  None. FINDINGS: Diffuse osteopenia. No fracture or dislocation seen. Extensive arterial calcifications. Small calcified uterine fibroids on the right. Lower lumbar spine degenerative changes.  IMPRESSION: 1. No fracture or dislocation. 2. Lower lumbar spine degenerative changes. 3. Extensive atheromatous arterial calcifications. Electronically Signed   By: Claudie Revering M.D.   On: 03/21/2016 14:51   Mm Outside Films Mammo  Result Date: 03/07/2016 This examination belongs to an outside facility and is stored here for comparison purposes only.  Contact the originating outside institution for any associated report or interpretation.    ASSESSMENT/PLAN   81 yo female with history of ESRD, now with septic shock and hypotension. Complicated by volume overload with acute resp failure.   PULMONARY A:Acute respiratory failure.  OSA. P:   -Review of CXR images show mild pulmonary edema with moderate right pleural effusion.  -Continue Bipap and/or oxygen. Will need to monitor for intubation.  --Discussed with patient and daughter, pt is full code, ok with intubation. --Bipap at night when recovered.    CARDIOVASCULAR A: Severe septic shock with hypotension.  Volume overload acute on chronic.  P:  Given ESRD, will limit IVF, start pressors if needed.   RENAL A:  ESRD on HD.  P:   Continue HD per nephro.   GASTROINTESTINAL Will start TF's   HEMATOLOGIC A:  Anemia of chronic disease.  P:  Continue to monitor CBC.  Transfuse as needed.    INFECTIOUS A:  Septic shock.  P:   Check Blood cx, monitor cultures. Source uncertain.   Micro/culture results:  BCx2 -- UC -- Sputum--  Antibiotics: Zosyn 02/25/2016>> Vancomycin. 03/10/2016>>  ENDOCRINE - ICU hypoglycemic\Hyperglycemia protocol    NEUROLOGIC - intubated and sedated - minimal sedation to achieve a RASS goal: -1    Best Practices  DVT Prophylaxis: Heparin SQ. GI Prophylaxis: Famotidine.    I have personally obtained a history, examined the patient, evaluated Pertinent laboratory and [employer]/imaging results, and  formulated the assessment and plan   The Patient requires high complexity decision making for assessment and support, frequent evaluation and titration of therapies, application of advanced monitoring technologies and extensive interpretation of multiple databases. Critical Care Time devoted to patient care services described in this note is 35 minutes.   Overall, patient is critically ill, prognosis is guarded.  Patient with Multiorgan failure and at high risk for cardiac arrest and death.    Corrin Parker, M.D.  [facility]  Medical Director [place] Director [facility] Cardio-Pulmonary Department

## 2016-03-09 NOTE — Progress Notes (Signed)
[facility]  ROUNDING NOTE   Subjective:   Critically ill. Had only 3 minutes of treatment yesterday before hypotension and loss of consciousness.   Admitted for sepsis and now fluid overloaded. On vasopressors: norepinephrine and phenylephrine.   Intubated and sedated.   Objective:  Vital signs in last 24 hours:  Temp:  [93.7 F (34.3 C)-99.3 F (37.4 C)] 99.3 F (37.4 C) (09/15 0835) Pulse Rate:  [29-85] 73 (09/15 0921) Resp:  [8-24] 16 (09/15 0921) BP: (55-146)/(40-99) 92/55 (09/15 0921) SpO2:  [89 %-100 %] 100 % (09/15 0835) FiO2 (%):  [40 %-100 %] 40 % (09/15 0800) Weight:  [88.5 kg (195 lb)-98.9 kg (218 lb)] 88.5 kg (195 lb) (09/15 0921)  Weight change:  Filed Weights   03/05/2016 1320 03/06/2016 2300 03/09/16 0521  Weight: 98.9 kg (218 lb) 88.8 kg (195 lb 12.3 oz) 89.3 kg (196 lb 13.9 oz)    Intake/Output: I/O last 3 completed shifts: In: 9381 [I.V.:955; IV Piggyback:300] Out: -    Intake/Output this shift:  Total I/O In: 788.4 [I.V.:788.4] Out: -   Physical Exam: General: Critically ill  Head: +ETT, +OGT  Eyes: Eyes open  Neck: Left peripheral EJ line  Lungs:  PRVC FIO2 60%  Heart: Regular rate and rhythm  Abdomen:  Soft, nontender, obese  Extremities:  +++ peripheral edema.  Neurologic: Intubated, sedated  Skin: No lesions  Access: Left AVF    Basic Metabolic Panel:  Recent Labs Lab 03/19/2016 1333 03/09/16 0458  NA 140 137  K 3.4* 3.0*  CL 98* 97*  CO2 35* 31  GLUCOSE 109* 170*  BUN 21* 23*  CREATININE 2.67* 2.85*  CALCIUM 8.5* 8.0*    Liver Function Tests:  Recent Labs Lab 03/14/2016 1333  AST 35  ALT 63*  ALKPHOS 122  BILITOT 0.7  PROT 4.3*  ALBUMIN 1.5*   No results for input(s): LIPASE, AMYLASE in the last 168 hours. No results for input(s): AMMONIA in the last 168 hours.  CBC:  Recent Labs Lab 03/18/2016 1333 03/09/16 0458  WBC 17.3* 19.8*  NEUTROABS 15.6*  --   HGB 9.6* 9.1*  HCT 29.8* 27.9*  MCV 97.0  96.8  PLT 93* 108*    Cardiac Enzymes:  Recent Labs Lab 03/20/2016 1333 03/12/2016 1901  TROPONINI 0.04* 0.13*    BNP: Invalid input(s): POCBNP  CBG:  Recent Labs Lab 03/12/2016 2242 03/09/16 0051 03/09/16 0357 03/09/16 0726  GLUCAP 112* 112* 130* 102*    Microbiology: Results for orders placed or performed during the hospital encounter of 03/19/2016  MRSA PCR Screening     Status: None   Collection Time: 03/09/16 12:20 AM  Result Value Ref Range Status   MRSA by PCR NEGATIVE NEGATIVE Final    Comment:        The GeneXpert MRSA Assay (FDA approved for NASAL specimens only), is one component of a comprehensive MRSA colonization surveillance program. It is not intended to diagnose MRSA infection nor to guide or monitor treatment for MRSA infections.     Coagulation Studies:  Recent Labs  03/22/2016 1333 03/23/2016 1901  LABPROT 24.1* 25.4*  INR 2.12 2.27    Urinalysis:  Recent Labs  03/01/2016 1902  COLORURINE AMBER*  LABSPEC 1.015  PHURINE 6.0  GLUCOSEU 50*  HGBUR 3+*  BILIRUBINUR NEGATIVE  KETONESUR NEGATIVE  PROTEINUR 100*  NITRITE NEGATIVE  LEUKOCYTESUR TRACE*      Imaging: Dg Chest 1 View  Result Date: 03/23/2016 CLINICAL DATA:  81 year old female intubated. Initial  encounter. EXAM: CHEST 1 VIEW COMPARISON:  1431 hours today. FINDINGS: Portable AP supine view at 2120 hours. Endotracheal tube tip about 10 mm above the carina. Similar lung volumes. Stable cardiac size and mediastinal contours. Veiling opacity persists in the right lung suggesting pleural effusion. Smaller veiling opacity now on the left lung. No definite pneumothorax on this supine view. IMPRESSION: 1. Intubated with endotracheal tube tip about 10 mm from the carina. 2. Right greater than left pleural effusions. Associated right lung base atelectasis or consolidation. Electronically Signed   By: Genevie Ann M.D.   On: 03/22/2016 22:15   Dg Abd 1 View  Result Date: 03/01/2016 CLINICAL  DATA:  Orogastric tube placement EXAM: ABDOMEN - 1 VIEW COMPARISON:  None. FINDINGS: The orogastric tube extends into the stomach, coiled in the fundus. IMPRESSION: OG tube extends into the stomach. Electronically Signed   By: Andreas Newport M.D.   On: 03/01/2016 23:55   Dg Chest Port 1 View  Result Date: 03/09/2016 CLINICAL DATA:  Dyspnea. EXAM: PORTABLE CHEST 1 VIEW COMPARISON:  Yesterday at 2120 hour FINDINGS: Endotracheal tube is 1.4 cm from the carina. Enteric tube in place, tip and side port below the diaphragm. Right pleural effusion is similar, left pleural effusion has improved. Associated bibasilar opacities, favoring atelectasis. Cardiomediastinal contours are unchanged. Chronic degenerative change of both shoulders. IMPRESSION: Improved left pleural effusion. Unchanged right pleural effusion. Bibasilar opacities are again seen. Endotracheal and enteric tubes in place. Electronically Signed   By: Jeb Levering M.D.   On: 03/09/2016 04:10   Dg Chest Port 1 View  Result Date: 03/17/2016 CLINICAL DATA:  Shortness of breath. [place] out of bed onto the floor 1 week ago. EXAM: PORTABLE CHEST 1 VIEW COMPARISON:  08/22/2015. FINDINGS: Mildly enlarged cardiac silhouette with a mild increase in size. Interval moderate-sized right pleural effusion. Interval patchy and linear density in both mid lung zones. Interval airspace opacity at the medial left lung base. Diffuse osteopenia. Thoracic spine degenerative changes. IMPRESSION: 1. Moderate-sized right pleural effusion. 2. Bilateral atelectasis and possible pneumonia. 3. Mild cardiomegaly with mild progression. Electronically Signed   By: Claudie Revering M.D.   On: 03/10/2016 14:54   Dg Hip Unilat W Or Wo Pelvis 2-3 Views Right  Result Date: 03/17/2016 CLINICAL DATA:  Right hip pain after falling out of bed 1 week ago. EXAM: DG HIP (WITH OR WITHOUT PELVIS) 2-3V RIGHT COMPARISON:  None. FINDINGS: Diffuse osteopenia. No fracture or dislocation seen.  Extensive arterial calcifications. Small calcified uterine fibroids on the right. Lower lumbar spine degenerative changes. IMPRESSION: 1. No fracture or dislocation. 2. Lower lumbar spine degenerative changes. 3. Extensive atheromatous arterial calcifications. Electronically Signed   By: Claudie Revering M.D.   On: 02/27/2016 14:51   Mm Outside Films Mammo  Result Date: 03/07/2016 This examination belongs to an outside facility and is stored here for comparison purposes only.  Contact the originating outside institution for any associated report or interpretation.    Medications:   . fentaNYL infusion INTRAVENOUS 50 mcg/hr (03/09/16 0805)  . norepinephrine 8 mcg/min (03/09/16 0825)  . phenylephrine (NEO-SYNEPHRINE) Adult infusion 80 mcg/min (03/09/16 0805)   . albumin human  25 g Intravenous Once  . chlorhexidine gluconate (MEDLINE KIT)  15 mL Mouth Rinse BID  . chlorhexidine gluconate (MEDLINE KIT)  15 mL Mouth Rinse BID  . famotidine (PEPCID) IV  20 mg Intravenous Q24H  . heparin  5,000 Units Subcutaneous Q8H  . insulin aspart  0-15 Units Subcutaneous Q4H  .  mouth rinse  15 mL Mouth Rinse QID  . mouth rinse  15 mL Mouth Rinse QID  . piperacillin-tazobactam (ZOSYN)  IV  3.375 g Intravenous Q12H  . [START ON 03/10/2016] vancomycin  1,000 mg Intravenous Q T,Th,Sa-HD   sodium chloride, fentaNYL (SUBLIMAZE) injection, fentaNYL (SUBLIMAZE) injection, midazolam, midazolam  Assessment/ Plan:  [name] is a 81 y.o. black female with atrial fibrillation, anemia, coronary artery disease, diabetes mellitus type 2, hypertension, hyperlipidemia, history of breast cancer   TTS [facility]  1. End Stage Renal Disease: patient with anasarca. Albumin at 1.5. Nephrotic syndrome. However malnutrion and sepsis are contributing.  - Emergent hemodialysis today. IV albumin and midodrine through the OGT ordered. On vasopressors. Will attempt isolated ultrafiltration for the first 1.5-2 hours of  treatment before transitioning to traditional hemodialysis.  - 4K bath - if hemodynamically unstable, will transition to CRRT.   2. Sepsis: with hypotension requiring vasopressors. Unclear source. Blood cultures pending. Family is concerned about an ear infection.  - empiric vancomycin and zosyn.  - midodrine as above.   3. Anemia of chronic kidney disease: gets mircera as outpatient  - epo with treatment.   4. Secondary Hyperparathyroidism: with hypophosphatemia - most likely due to malnutrition and chronic illness.  - hold binders.    LOS: [place], [place] 9/15/20179:34 AM

## 2016-03-09 NOTE — Progress Notes (Signed)
Post dialysis assessment 

## 2016-03-09 NOTE — Progress Notes (Signed)
Pre dialysis assessment 

## 2016-03-09 NOTE — Progress Notes (Signed)
Initial Nutrition Assessment  DOCUMENTATION CODES:   Obesity unspecified  INTERVENTION:  -Per MD Kasa in ICU rounds wanting to start enteral nutrition at this time.  Recommend starting vital high protein at 15ml/hr at this time with pt current condition.  Goal rate of tube feeding will be Vital High Protein at 48ml/hr.  Will provide 1200 kcals, 105 g of protein and 1047ml free water. Recommend tube maintenance flush of 37ml q 4 hr.     NUTRITION DIAGNOSIS:   Inadequate oral intake related to acute illness as evidenced by NPO status.    GOAL:   Provide needs based on ASPEN/SCCM guidelines    MONITOR:   TF tolerance, Weight trends  REASON FOR ASSESSMENT:   Ventilator    ASSESSMENT:      Pt admitted with hypotension requiring fluid bolus to keep BP elevated now with volume overload and intubated. Requiring HD at this time and may transition to CRRT  Pt with history of afib, anemia, CAD, DM, HTN, HLD, breast cancer  Medications reviewed: aspart, levophed, fentanyl, vasopressin Labs reviewed: K 3.0, BUn 23, creatinine 2.85, glucose 170  Noted OG in stomach per xray.  Noted deep pitting edema in lower extremities   Diet Order:  Diet NPO time specified  Skin:  Reviewed, no issues  Last BM:  PTA  Height:   Ht Readings from Last 1 Encounters:  03/06/2016 5\' 5"  (1.651 m)    Weight: 9% wt loss in the last 7 months  Wt Readings from Last 1 Encounters:  03/09/16 196 lb 13.9 oz (89.3 kg)    Ideal Body Weight:     BMI:  Body mass index is 32.76 kg/m.  Estimated Nutritional Needs:   Kcal:  [id number] kcals/d  Protein:  114-142 g/d (Using IBW of 57kg)  Fluid:  1083ml + UOP  EDUCATION NEEDS:   No education needs identified at this time  Lismary Kiehn B. Zenia Resides, [place], [place] (pager) Weekend/On-Call pager [phone])

## 2016-03-10 ENCOUNTER — Inpatient Hospital Stay: Payer: Medicare HMO

## 2016-03-10 DIAGNOSIS — A419 Sepsis, unspecified organism: Principal | ICD-10-CM

## 2016-03-10 LAB — BLOOD GAS, ARTERIAL
ACID-BASE EXCESS: 12.8 mmol/L — AB (ref 0.0–2.0)
BICARBONATE: 35 mmol/L — AB (ref 20.0–28.0)
FIO2: 40
MECHVT: 450 mL
Mechanical Rate: 18
O2 SAT: 99.8 %
PCO2 ART: 34 mmHg (ref 32.0–48.0)
PEEP/CPAP: 5 cmH2O
PH ART: 7.62 — AB (ref 7.350–7.450)
Patient temperature: 37
pO2, Arterial: 179 mmHg — ABNORMAL HIGH (ref 83.0–108.0)

## 2016-03-10 LAB — GLUCOSE, CAPILLARY
GLUCOSE-CAPILLARY: 127 mg/dL — AB (ref 65–99)
Glucose-Capillary: 121 mg/dL — ABNORMAL HIGH (ref 65–99)
Glucose-Capillary: 118 mg/dL — ABNORMAL HIGH (ref 65–99)
Glucose-Capillary: 122 mg/dL — ABNORMAL HIGH (ref 65–99)
Glucose-Capillary: 133 mg/dL — ABNORMAL HIGH (ref 65–99)
Glucose-Capillary: 144 mg/dL — ABNORMAL HIGH (ref 65–99)

## 2016-03-10 LAB — BASIC METABOLIC PANEL
Anion gap: 11 (ref 5–15)
BUN: 20 mg/dL (ref 6–20)
CALCIUM: 8.2 mg/dL — AB (ref 8.9–10.3)
CO2: 29 mmol/L (ref 22–32)
CREATININE: 2.42 mg/dL — AB (ref 0.44–1.00)
Chloride: 98 mmol/L — ABNORMAL LOW (ref 101–111)
GFR calc non Af Amer: 18 mL/min — ABNORMAL LOW (ref >60)
GFR, EST AFRICAN AMERICAN: 21 mL/min — AB (ref >60)
GLUCOSE: 143 mg/dL — AB (ref 65–99)
Potassium: 3.3 mmol/L — ABNORMAL LOW (ref 3.5–5.1)
Sodium: 138 mmol/L (ref 135–145)

## 2016-03-10 LAB — CBC
HEMATOCRIT: 22.4 % — AB (ref 35.0–47.0)
Hemoglobin: 7.4 g/dL — ABNORMAL LOW (ref 12.0–16.0)
MCH: 31.4 pg (ref 26.0–34.0)
MCHC: 33.2 g/dL (ref 32.0–36.0)
MCV: 94.8 fL (ref 80.0–100.0)
PLATELETS: 94 10*3/uL — AB (ref 150–440)
RBC: 2.36 MIL/uL — ABNORMAL LOW (ref 3.80–5.20)
RDW: 20.4 % — AB (ref 11.5–14.5)
WBC: 11 10*3/uL (ref 3.6–11.0)

## 2016-03-10 LAB — PROCALCITONIN: PROCALCITONIN: 2.26 ng/mL

## 2016-03-10 MED ORDER — FENTANYL CITRATE (PF) 100 MCG/2ML IJ SOLN
100.0000 ug | INTRAMUSCULAR | Status: DC | PRN
  Administered 2016-03-10 – 2016-03-11 (×2): 100 ug via INTRAVENOUS
  Filled 2016-03-10: qty 2

## 2016-03-10 MED ORDER — SENNOSIDES 8.8 MG/5ML PO SYRP
5.0000 mL | ORAL_SOLUTION | Freq: Two times a day (BID) | ORAL | Status: DC
  Administered 2016-03-10: 5 mL via ORAL
  Administered 2016-03-10: 1 mL via ORAL
  Administered 2016-03-11 – 2016-03-12 (×4): 5 mL via ORAL
  Filled 2016-03-10 (×6): qty 5

## 2016-03-10 MED ORDER — NOREPINEPHRINE BITARTRATE 1 MG/ML IV SOLN: 0.0000 ug/min | INTRAVENOUS | Status: DC

## 2016-03-10 MED ORDER — DOCUSATE SODIUM 50 MG/5ML PO LIQD
100.0000 mg | Freq: Every day | ORAL | Status: DC
  Administered 2016-03-10 – 2016-03-12 (×3): 100 mg via ORAL
  Filled 2016-03-10 (×3): qty 10

## 2016-03-10 MED ORDER — MIDAZOLAM HCL 2 MG/2ML IJ SOLN
2.0000 mg | INTRAMUSCULAR | Status: DC | PRN
  Administered 2016-03-10 – 2016-03-12 (×6): 2 mg via INTRAVENOUS
  Filled 2016-03-10 (×4): qty 2
  Filled 2016-03-10: qty 4
  Filled 2016-03-10: qty 2

## 2016-03-10 MED ORDER — NOREPINEPHRINE 4 MG/250ML-% IV SOLN
0.0000 ug/min | INTRAVENOUS | Status: DC
  Administered 2016-03-10: 1 ug/min via INTRAVENOUS
  Filled 2016-03-10: qty 250

## 2016-03-10 MED ORDER — POTASSIUM CHLORIDE 20 MEQ PO PACK
60.0000 meq | PACK | Freq: Once | ORAL | Status: AC
  Administered 2016-03-10: 60 meq via NASOGASTRIC
  Filled 2016-03-10: qty 3

## 2016-03-10 MED ORDER — VITAL HIGH PROTEIN PO LIQD
1000.0000 mL | ORAL | Status: DC
  Administered 2016-03-10: 1000 mL
  Administered 2016-03-11: 09:00:00
  Administered 2016-03-11: 1000 mL
  Administered 2016-03-11 – 2016-03-12 (×9)
  Administered 2016-03-12: 1000 mL
  Administered 2016-03-12 (×2)

## 2016-03-10 MED ORDER — ALBUMIN HUMAN 25 % IV SOLN
25.0000 g | Freq: Three times a day (TID) | INTRAVENOUS | Status: DC
  Administered 2016-03-10 – 2016-03-12 (×8): 25 g via INTRAVENOUS
  Filled 2016-03-10 (×10): qty 100

## 2016-03-10 MED ORDER — ALBUMIN HUMAN 25 % IV SOLN
25.0000 g | Freq: Once | INTRAVENOUS | Status: AC
  Administered 2016-03-10: 25 g via INTRAVENOUS
  Filled 2016-03-10: qty 100

## 2016-03-10 NOTE — Progress Notes (Signed)
HD COMPLETED  

## 2016-03-10 NOTE — Progress Notes (Signed)
Nutrition Follow-up  DOCUMENTATION CODES:   Obesity unspecified  INTERVENTION:  -Spoke with MD Kasa regarding concern for distended abdomen and advancement of tube feeding.  MD Kasa wanting to increase rate of tube feeding at this time to 47ml/hr, goal rate.  PEP up protocol not started at this time. Adding stool softners today.  ?? Abdominal xray.   NUTRITION DIAGNOSIS:   Inadequate oral intake related to acute illness as evidenced by NPO status.  Ongonig  GOAL:   Provide needs based on ASPEN/SCCM guidelines  Improving as increasing tube feeding  MONITOR:   TF tolerance, Weight trends  REASON FOR ASSESSMENT:   Ventilator    ASSESSMENT:      Pt remains on vent at this time.  Pt tolerating Vital High Protein at 64ml/hr at this time.  Discussed with RN, Judson Roch and concerned regarding distended abdomen.  Noted since admission abdomen has been charted as obese, obese with gross ascites, obese, distended.    Colace and senokot added to promote BM today  Labs and medications reviewed:  K 3.3, BUN 20, creatinine 2.42, glucose 143  Diet Order:  Diet NPO time specified  Skin:  Reviewed, no issues  Last BM:  PTA  Height:   Ht Readings from Last 1 Encounters:  02/29/2016 5\' 5"  (1.651 m)    Weight:   Wt Readings from Last 1 Encounters:  03/10/16 199 lb 15.3 oz (90.7 kg)    Ideal Body Weight:     BMI:  Body mass index is 31.32 kg/m.  Estimated Nutritional Needs:   Kcal:  [id number] kcals/d  Protein:  114-142 g/d (Using IBW of 57kg)  Fluid:  1068ml + UOP  EDUCATION NEEDS:   No education needs identified at this time  Delana Manganello B. Zenia Resides, [place], [place] (pager) Weekend/On-Call pager [phone])

## 2016-03-10 NOTE — Plan of Care (Signed)
Called [facility] MD about HR trending down onto mid-40s non-sustaining, also about u wave in strip in the v lead.  Reduced Fentanyl to 31mcg from 50mcg since pt was more calm post administration of versed 2 mg

## 2016-03-10 NOTE — Progress Notes (Signed)
Pharmacy Antibiotic Note  Teresa Lutz is a 81 y.o. female admitted on 03/17/2016 with sepsis.  Pharmacy has been consulted for Zosyn dosing.  Plan:   Will continue  patient on Zosyn 3.375 IV every 12 hours. Patient is on hemodialysis for ESRD.  Height: 5\' 5"  (165.1 cm) Weight: 199 lb 15.3 oz (90.7 kg) IBW/kg (Calculated) : 57  Temp (24hrs), Avg:98.5 F (36.9 C), Min:97 F (36.1 C), Max:99.5 F (37.5 C)   Recent Labs Lab 02/24/2016 1333 03/07/2016 1901 03/09/16 0458 03/10/16 0449  WBC 17.3*  --  19.8* 11.0  CREATININE 2.67*  --  2.85* 2.42*  LATICACIDVEN 2.3* 1.4  --   --     Estimated Creatinine Clearance: 21 mL/min (by C-G formula based on SCr of 2.42 mg/dL (H)).    Allergies  Allergen Reactions  . Celebrex [Celecoxib] Hives    Antimicrobials this admission: 9/14 Zosyn >>  9/14 Vancomycin >> 9/15  Dose adjustments this admission:   Microbiology results: 9/14 BCx: pending  Thank you for allowing pharmacy to be a part of this patient's care.  Larene Beach, PharmD  Clinical Pharmacist 03/10/2016 10:55 AM

## 2016-03-10 NOTE — H&P (Signed)
[facility] Consultation      Name: Jahmaya Siemer MRN: [id number] DOB: May 15, 1935    ADMISSION DATE:  03/21/2016   CHIEF COMPLAINT:  hypotension.    HISTORY OF PRESENT ILLNESS:   The patient is a 81 yo female with a history of ESRD. She had her permacath removed on 8/28 due to working dialysis fistula. She has a history of afib and diast CHF, anemia of chronic disease, sleep apnea on CPAP, hypothyroidism, chronic leg edema and history of breast cancer. She was recently seen in the ER due to fall and hitting of the head, nothing significant was found, then the patient was discharged.   She was seen at HD today and it was noted that she was hypotensive. Her BP was 50/40, she was not dialyzed and sent to the ER. While in the ER she has been persistently hypertensive and required boluses of fluid to keep her BP elevated.  During that time she has become volume overloaded and she is now requiring a 100% NRB.   Currently on my eval, she is not able to give a history due to dyspnea, she is in trendelenburg to keep her BP elevated. She is not able to give history due to dyspnea and lying nearly upside down.    Hosp Course 9/15 remains intubated,sedated, full vent support 9/16 remains intubated  Allergies  Allergen Reactions  . Celebrex [Celecoxib] Hives   REVIEW OF SYSTEMS:   Unable to provide due to critical illness.    VITAL SIGNS: Temp:  [97 F (36.1 C)-99.5 F (37.5 C)] 98.1 F (36.7 C) (09/16 0811) Pulse Rate:  [51-76] 56 (09/16 0800) Resp:  [0-22] 12 (09/16 0800) BP: (53-166)/(30-141) 113/49 (09/16 0800) SpO2:  [94 %-100 %] 100 % (09/16 0800) FiO2 (%):  [30 %-40 %] 30 % (09/16 0750) Weight:  [195 lb (88.5 kg)-199 lb 15.3 oz (90.7 kg)] 199 lb 15.3 oz (90.7 kg) (09/16 0457) HEMODYNAMICS:   VENTILATOR SETTINGS: Vent Mode: PRVC FiO2 (%):  [30 %-40 %] 30 % Set Rate:  [12 bmp-18 bmp] 12 bmp Vt Set:  [450 mL] 450 mL PEEP:  [5 cmH20] 5  cmH20 Plateau Pressure:  [19 cmH20-20 cmH20] 19 cmH20 INTAKE / OUTPUT:  Intake/Output Summary (Last 24 hours) at 03/10/16 0827 Last data filed at 03/10/16 0600  Gross per 24 hour  Intake          1882.43 ml  Output              316 ml  Net          1566.43 ml    Physical Examination:   VS: BP (!) 113/49   Pulse (!) 56   Temp 98.1 F (36.7 C) (Oral)   Resp 12   Ht 5\' 5"  (1.651 m)   Wt 199 lb 15.3 oz (90.7 kg)   SpO2 100%   BMI 31.32 kg/m   General Appearance:gcs<8T, intubated Neuro:without focal findings, mental status reduced.  HEENT: PERRLA, EOM intact, no ptosis. Pulmonary: bilateral crackles.  CardiovascularNormal S1,S2.  No m/r/g.    Abdomen: Benign, Soft, non-tender. Renal:  No costovertebral tenderness  GU:  Not performed at this time. Endoc: No evident thyromegaly, no signs of acromegaly. Skin:   warm, no rashes, no ecchymosis  Extremities: normal, no cyanosis, clubbing, no edema, warm with normal capillary refill.    LABS: Reviewed   LABORATORY PANEL:   CBC  Recent Labs Lab 03/10/16 0449  WBC 11.0  HGB 7.4*  HCT 22.4*  PLT 94*    Chemistries   Recent Labs Lab 03/16/2016 1333  03/10/16 0449  NA 140  < > 138  K 3.4*  < > 3.3*  CL 98*  < > 98*  CO2 35*  < > 29  GLUCOSE 109*  < > 143*  BUN 21*  < > 20  CREATININE 2.67*  < > 2.42*  CALCIUM 8.5*  < > 8.2*  AST 35  --   --   ALT 63*  --   --   ALKPHOS 122  --   --   BILITOT 0.7  --   --   < > = values in this interval not displayed.   Recent Labs Lab 03/09/16 1113 03/09/16 1634 03/09/16 1950 03/09/16 2347 03/10/16 0352 03/10/16 0742  GLUCAP 110* 96 124* 122* 121* 118*    Recent Labs Lab 03/09/16 0052 03/10/16 0436  PHART 7.65* 7.62*  PCO2ART 30* 34  PO2ART 198* 179*    Recent Labs Lab 03/02/2016 1333  AST 35  ALT 63*  ALKPHOS 122  BILITOT 0.7  ALBUMIN 1.5*    Cardiac Enzymes  Recent Labs Lab 03/23/2016 1901  TROPONINI 0.13*    RADIOLOGY:  Dg Chest 1  View  Result Date: 03/01/2016 CLINICAL DATA:  81 year old female intubated. Initial encounter. EXAM: CHEST 1 VIEW COMPARISON:  1431 hours today. FINDINGS: Portable AP supine view at 2120 hours. Endotracheal tube tip about 10 mm above the carina. Similar lung volumes. Stable cardiac size and mediastinal contours. Veiling opacity persists in the right lung suggesting pleural effusion. Smaller veiling opacity now on the left lung. No definite pneumothorax on this supine view. IMPRESSION: 1. Intubated with endotracheal tube tip about 10 mm from the carina. 2. Right greater than left pleural effusions. Associated right lung base atelectasis or consolidation. Electronically Signed   By: Genevie Ann M.D.   On: 03/19/2016 22:15   Dg Abd 1 View  Result Date: 02/29/2016 CLINICAL DATA:  Orogastric tube placement EXAM: ABDOMEN - 1 VIEW COMPARISON:  None. FINDINGS: The orogastric tube extends into the stomach, coiled in the fundus. IMPRESSION: OG tube extends into the stomach. Electronically Signed   By: Andreas Newport M.D.   On: 03/12/2016 23:55   Dg Chest Port 1 View  Result Date: 03/10/2016 CLINICAL DATA:  Acute respiratory failure. Subsequent encounter. Intubated patient. EXAM: PORTABLE CHEST 1 VIEW COMPARISON:  None 15 17 FINDINGS: Moderate right pleural effusion. Central vascular congestion and interstitial thickening. Streaky lung base opacities. No new lung opacities. No pneumothorax. Endotracheal tube and nasal/ orogastric tube are well positioned. IMPRESSION: 1. No significant change from previous day's study. Moderate right pleural effusion. 2. Interstitial thickening streaky lung base opacity is likely combination of mild pulmonary edema and lung base atelectasis. Electronically Signed   By: Lajean Manes M.D.   On: 03/10/2016 07:16   Dg Chest Port 1 View  Result Date: 03/09/2016 CLINICAL DATA:  Dyspnea. EXAM: PORTABLE CHEST 1 VIEW COMPARISON:  Yesterday at 2120 hour FINDINGS: Endotracheal tube is 1.4 cm  from the carina. Enteric tube in place, tip and side port below the diaphragm. Right pleural effusion is similar, left pleural effusion has improved. Associated bibasilar opacities, favoring atelectasis. Cardiomediastinal contours are unchanged. Chronic degenerative change of both shoulders. IMPRESSION: Improved left pleural effusion. Unchanged right pleural effusion. Bibasilar opacities are again seen. Endotracheal and enteric tubes in place. Electronically Signed   By: Jeb Levering M.D.   On: 03/09/2016 04:10   Dg  Chest Port 1 View  Result Date: 02/29/2016 CLINICAL DATA:  Shortness of breath. [place] out of bed onto the floor 1 week ago. EXAM: PORTABLE CHEST 1 VIEW COMPARISON:  08/22/2015. FINDINGS: Mildly enlarged cardiac silhouette with a mild increase in size. Interval moderate-sized right pleural effusion. Interval patchy and linear density in both mid lung zones. Interval airspace opacity at the medial left lung base. Diffuse osteopenia. Thoracic spine degenerative changes. IMPRESSION: 1. Moderate-sized right pleural effusion. 2. Bilateral atelectasis and possible pneumonia. 3. Mild cardiomegaly with mild progression. Electronically Signed   By: Claudie Revering M.D.   On: 03/05/2016 14:54   Dg Hip Unilat W Or Wo Pelvis 2-3 Views Right  Result Date: 03/14/2016 CLINICAL DATA:  Right hip pain after falling out of bed 1 week ago. EXAM: DG HIP (WITH OR WITHOUT PELVIS) 2-3V RIGHT COMPARISON:  None. FINDINGS: Diffuse osteopenia. No fracture or dislocation seen. Extensive arterial calcifications. Small calcified uterine fibroids on the right. Lower lumbar spine degenerative changes. IMPRESSION: 1. No fracture or dislocation. 2. Lower lumbar spine degenerative changes. 3. Extensive atheromatous arterial calcifications. Electronically Signed   By: Claudie Revering M.D.   On: 03/10/2016 14:51     ASSESSMENT/PLAN   81 yo female with history of ESRD, now with septic shock and hypotension. Complicated by volume  overload with acute resp failure.   PULMONARY A:Acute respiratory failure.  OSA. P:   Will attempt SAT/SBT today  CARDIOVASCULAR A: Severe septic shock with hypotension.  Volume overload acute on chronic.  P:  Given ESRD, will limit IVF, Wean off vasopressors, keep MAP>65  RENAL A:  ESRD on HD.  P:   Continue HD per nephro.   GASTROINTESTINAL continue TF's   HEMATOLOGIC A:  Anemia of chronic disease.  P:  Continue to monitor CBC.  Transfuse as needed.   INFECTIOUS A:  Septic shock.  P:   Check Blood cx, monitor cultures. Source uncertain.   Micro/culture results:  BCx2 -- UC -- Sputum--  Antibiotics: Zosyn 03/07/2016>> Vancomycin. 02/28/2016>>  ENDOCRINE - ICU hypoglycemic\Hyperglycemia protocol    NEUROLOGIC - intubated and sedated - minimal sedation to achieve a RASS goal: -1    Best Practices  DVT Prophylaxis: Heparin SQ. GI Prophylaxis: Famotidine.    I have personally obtained a history, examined the patient, evaluated Pertinent laboratory and [employer]/imaging results, and  formulated the assessment and plan   The Patient requires high complexity decision making for assessment and support, frequent evaluation and titration of therapies, application of advanced monitoring technologies and extensive interpretation of multiple databases. Critical Care Time devoted to patient care services described in this note is 35 minutes.   Overall, patient is critically ill, prognosis is guarded.  Patient with Multiorgan failure and at high risk for cardiac arrest and death.  Will need to discuss with family, plan for care, recommend DNR status and plan for SAT/SBt today  Keiffer Piper Patricia Pesa, M.D.  [facility]  Medical Director [place] Director [facility] Cardio-Pulmonary Department

## 2016-03-10 NOTE — Progress Notes (Signed)
HD STARTED  

## 2016-03-10 NOTE — Progress Notes (Signed)
[facility]  ROUNDING NOTE   Subjective:   Hemodialysis yesterday. Unable to get substantial UF.  Now on norepinephrine and vasopressin. Blood pressure more accurate   Objective:  Vital signs in last 24 hours:  Temp:  [97 F (36.1 C)-99.5 F (37.5 C)] 98.4 F (36.9 C) (09/16 1100) Pulse Rate:  [51-74] 51 (09/16 1100) Resp:  [0-22] 12 (09/16 1100) BP: (54-166)/(30-141) 114/51 (09/16 1100) SpO2:  [94 %-100 %] 100 % (09/16 1100) FiO2 (%):  [30 %-40 %] 30 % (09/16 1000) Weight:  [90.7 kg (199 lb 15.3 oz)] 90.7 kg (199 lb 15.3 oz) (09/16 0457)  Weight change: -8.184 kg (-18 lb 0.7 oz) Filed Weights   03/14/2016 2300 03/09/16 0521 03/10/16 0457  Weight: 88.8 kg (195 lb 12.3 oz) 89.3 kg (196 lb 13.9 oz) 90.7 kg (199 lb 15.3 oz)    Intake/Output: I/O last 3 completed shifts: In: 3725.8 [I.V.:3208.8; NG/GT:317; IV Piggyback:200] Out: 316 [Other:316]   Intake/Output this shift:  Total I/O In: 529 [I.V.:434.6; NG/GT:94.3] Out: -   Physical Exam: General: Critically ill  Head: +ETT, +OGT  Eyes: Eyes open  Neck: Left peripheral EJ line  Lungs:  PRVC FIO2 30%  Heart: Regular rate and rhythm  Abdomen:  Soft, nontender, obese  Extremities:  +++ peripheral edema.  Neurologic: Intubated, sedated  Skin: No lesions  Access: Left AVF    Basic Metabolic Panel:  Recent Labs Lab 02/27/2016 1333 03/09/16 0458 03/10/16 0449  NA 140 137 138  K 3.4* 3.0* 3.3*  CL 98* 97* 98*  CO2 35* 31 29  GLUCOSE 109* 170* 143*  BUN 21* 23* 20  CREATININE 2.67* 2.85* 2.42*  CALCIUM 8.5* 8.0* 8.2*    Liver Function Tests:  Recent Labs Lab 03/04/2016 1333  AST 35  ALT 63*  ALKPHOS 122  BILITOT 0.7  PROT 4.3*  ALBUMIN 1.5*   No results for input(s): LIPASE, AMYLASE in the last 168 hours. No results for input(s): AMMONIA in the last 168 hours.  CBC:  Recent Labs Lab 03/03/2016 1333 03/09/16 0458 03/10/16 0449  WBC 17.3* 19.8* 11.0  NEUTROABS 15.6*  --   --   HGB  9.6* 9.1* 7.4*  HCT 29.8* 27.9* 22.4*  MCV 97.0 96.8 94.8  PLT 93* 108* 94*    Cardiac Enzymes:  Recent Labs Lab 03/06/2016 1333 02/24/2016 1901  TROPONINI 0.04* 0.13*    BNP: Invalid input(s): POCBNP  CBG:  Recent Labs Lab 03/09/16 1950 03/09/16 2347 03/10/16 0352 03/10/16 0742 03/10/16 1116  GLUCAP 124* 122* 121* 118* 61*    Microbiology: Results for orders placed or performed during the hospital encounter of 03/17/2016  Blood culture (single)     Status: None (Preliminary result)   Collection Time: 03/05/2016  1:37 PM  Result Value Ref Range Status   Specimen Description BLOOD RIGHT AC  Final   Special Requests   Final    BOTTLES DRAWN AEROBIC AND ANAEROBIC AER 10ML ANA 9ML   Culture NO GROWTH 2 DAYS  Final   Report Status PENDING  Incomplete  MRSA PCR Screening     Status: None   Collection Time: 03/09/16 12:20 AM  Result Value Ref Range Status   MRSA by PCR NEGATIVE NEGATIVE Final    Comment:        The GeneXpert MRSA Assay (FDA approved for NASAL specimens only), is one component of a comprehensive MRSA colonization surveillance program. It is not intended to diagnose MRSA infection nor to guide or monitor treatment  for MRSA infections.   Culture, blood (single) w Reflex to ID Panel     Status: None (Preliminary result)   Collection Time: 03/09/16 10:10 AM  Result Value Ref Range Status   Specimen Description BLOOD DRAWN BY DIALYSIS  Final   Special Requests BOTTLES DRAWN AEROBIC AND ANAEROBIC  11CC  Final   Culture NO GROWTH < 24 HOURS  Final   Report Status PENDING  Incomplete    Coagulation Studies:  Recent Labs  02/24/2016 1333 03/20/2016 1901  LABPROT 24.1* 25.4*  INR 2.12 2.27    Urinalysis:  Recent Labs  03/07/2016 1902  COLORURINE AMBER*  LABSPEC 1.015  PHURINE 6.0  GLUCOSEU 50*  HGBUR 3+*  BILIRUBINUR NEGATIVE  KETONESUR NEGATIVE  PROTEINUR 100*  NITRITE NEGATIVE  LEUKOCYTESUR TRACE*      Imaging: Dg Chest 1 View  Result  Date: 02/25/2016 CLINICAL DATA:  81 year old female intubated. Initial encounter. EXAM: CHEST 1 VIEW COMPARISON:  1431 hours today. FINDINGS: Portable AP supine view at 2120 hours. Endotracheal tube tip about 10 mm above the carina. Similar lung volumes. Stable cardiac size and mediastinal contours. Veiling opacity persists in the right lung suggesting pleural effusion. Smaller veiling opacity now on the left lung. No definite pneumothorax on this supine view. IMPRESSION: 1. Intubated with endotracheal tube tip about 10 mm from the carina. 2. Right greater than left pleural effusions. Associated right lung base atelectasis or consolidation. Electronically Signed   By: Genevie Ann M.D.   On: 03/21/2016 22:15   Dg Abd 1 View  Result Date: 03/16/2016 CLINICAL DATA:  Orogastric tube placement EXAM: ABDOMEN - 1 VIEW COMPARISON:  None. FINDINGS: The orogastric tube extends into the stomach, coiled in the fundus. IMPRESSION: OG tube extends into the stomach. Electronically Signed   By: Andreas Newport M.D.   On: 03/15/2016 23:55   Dg Chest Port 1 View  Result Date: 03/10/2016 CLINICAL DATA:  Acute respiratory failure. Subsequent encounter. Intubated patient. EXAM: PORTABLE CHEST 1 VIEW COMPARISON:  None 15 17 FINDINGS: Moderate right pleural effusion. Central vascular congestion and interstitial thickening. Streaky lung base opacities. No new lung opacities. No pneumothorax. Endotracheal tube and nasal/ orogastric tube are well positioned. IMPRESSION: 1. No significant change from previous day's study. Moderate right pleural effusion. 2. Interstitial thickening streaky lung base opacity is likely combination of mild pulmonary edema and lung base atelectasis. Electronically Signed   By: Lajean Manes M.D.   On: 03/10/2016 07:16   Dg Chest Port 1 View  Result Date: 03/09/2016 CLINICAL DATA:  Dyspnea. EXAM: PORTABLE CHEST 1 VIEW COMPARISON:  Yesterday at 2120 hour FINDINGS: Endotracheal tube is 1.4 cm from the  carina. Enteric tube in place, tip and side port below the diaphragm. Right pleural effusion is similar, left pleural effusion has improved. Associated bibasilar opacities, favoring atelectasis. Cardiomediastinal contours are unchanged. Chronic degenerative change of both shoulders. IMPRESSION: Improved left pleural effusion. Unchanged right pleural effusion. Bibasilar opacities are again seen. Endotracheal and enteric tubes in place. Electronically Signed   By: Jeb Levering M.D.   On: 03/09/2016 04:10   Dg Chest Port 1 View  Result Date: 02/26/2016 CLINICAL DATA:  Shortness of breath. [place] out of bed onto the floor 1 week ago. EXAM: PORTABLE CHEST 1 VIEW COMPARISON:  08/22/2015. FINDINGS: Mildly enlarged cardiac silhouette with a mild increase in size. Interval moderate-sized right pleural effusion. Interval patchy and linear density in both mid lung zones. Interval airspace opacity at the medial left lung base. Diffuse osteopenia.  Thoracic spine degenerative changes. IMPRESSION: 1. Moderate-sized right pleural effusion. 2. Bilateral atelectasis and possible pneumonia. 3. Mild cardiomegaly with mild progression. Electronically Signed   By: Claudie Revering M.D.   On: 03/17/2016 14:54   Dg Hip Unilat W Or Wo Pelvis 2-3 Views Right  Result Date: 02/24/2016 CLINICAL DATA:  Right hip pain after falling out of bed 1 week ago. EXAM: DG HIP (WITH OR WITHOUT PELVIS) 2-3V RIGHT COMPARISON:  None. FINDINGS: Diffuse osteopenia. No fracture or dislocation seen. Extensive arterial calcifications. Small calcified uterine fibroids on the right. Lower lumbar spine degenerative changes. IMPRESSION: 1. No fracture or dislocation. 2. Lower lumbar spine degenerative changes. 3. Extensive atheromatous arterial calcifications. Electronically Signed   By: Claudie Revering M.D.   On: 03/24/2016 14:51     Medications:   . fentaNYL infusion INTRAVENOUS Stopped (03/10/16 0813)  . norepinephrine (LEVOPHED) Adult infusion 3 mcg/min  (03/10/16 1120)  . vasopressin (PITRESSIN) infusion - *FOR SHOCK* 0.03 Units/min (03/10/16 0934)   . chlorhexidine gluconate (MEDLINE KIT)  15 mL Mouth Rinse BID  . chlorhexidine gluconate (MEDLINE KIT)  15 mL Mouth Rinse BID  . docusate  100 mg Oral Daily  . famotidine (PEPCID) IV  20 mg Intravenous Q24H  . feeding supplement (VITAL HIGH PROTEIN)  1,000 mL Per Tube Q24H  . heparin  5,000 Units Subcutaneous Q8H  . hydrocortisone sod succinate (SOLU-CORTEF) inj  50 mg Intravenous Q6H  . insulin aspart  0-15 Units Subcutaneous Q4H  . mouth rinse  15 mL Mouth Rinse QID  . mouth rinse  15 mL Mouth Rinse QID  . midodrine  10 mg Per Tube TID WC  . piperacillin-tazobactam (ZOSYN)  IV  3.375 g Intravenous Q12H  . sennosides  5 mL Oral BID   sodium chloride, fentaNYL (SUBLIMAZE) injection, fentaNYL (SUBLIMAZE) injection, midazolam  Assessment/ Plan:  Ms. Alaine Loughney is a 81 y.o. black female with atrial fibrillation, anemia, coronary artery disease, diabetes mellitus type 2, hypertension, hyperlipidemia, history of breast cancer   TTS [facility]  1. End Stage Renal Disease: patient with anasarca. Albumin at 1.5. Nephrotic syndrome. However malnutrion and sepsis are contributing.  Dialysis yesterday however no significant UF - Will attempt isolated ultrafiltration today - if hemodynamically unstable, will transition to CRRT.   2. Sepsis: with hypotension requiring vasopressors. Unclear source. Blood cultures pending. Family is concerned about an ear infection.  - empiric vancomycin and zosyn.  - midodrine   3. Anemia of chronic kidney disease: gets mircera as outpatient. Received EPO on 9/16  4. Secondary Hyperparathyroidism: with hypophosphatemia - most likely due to malnutrition and chronic illness.  - hold binders.    LOS: [place], [place] 9/16/201711:28 AM

## 2016-03-11 ENCOUNTER — Inpatient Hospital Stay: Payer: Medicare HMO

## 2016-03-11 DIAGNOSIS — Z66 Do not resuscitate: Secondary | ICD-10-CM

## 2016-03-11 LAB — BASIC METABOLIC PANEL
Anion gap: 6 (ref 5–15)
BUN: 28 mg/dL — ABNORMAL HIGH (ref 6–20)
CALCIUM: 9.4 mg/dL (ref 8.9–10.3)
CO2: 33 mmol/L — AB (ref 22–32)
CREATININE: 2.8 mg/dL — AB (ref 0.44–1.00)
Chloride: 98 mmol/L — ABNORMAL LOW (ref 101–111)
GFR calc Af Amer: 17 mL/min — ABNORMAL LOW (ref >60)
GFR calc non Af Amer: 15 mL/min — ABNORMAL LOW (ref >60)
GLUCOSE: 137 mg/dL — AB (ref 65–99)
Potassium: 4 mmol/L (ref 3.5–5.1)
Sodium: 137 mmol/L (ref 135–145)

## 2016-03-11 LAB — HEMOGLOBIN AND HEMATOCRIT, BLOOD
HEMATOCRIT: 28.5 % — AB (ref 35.0–47.0)
Hemoglobin: 9.5 g/dL — ABNORMAL LOW (ref 12.0–16.0)

## 2016-03-11 LAB — GLUCOSE, CAPILLARY
GLUCOSE-CAPILLARY: 108 mg/dL — AB (ref 65–99)
GLUCOSE-CAPILLARY: 70 mg/dL (ref 65–99)
GLUCOSE-CAPILLARY: 72 mg/dL (ref 65–99)
Glucose-Capillary: 138 mg/dL — ABNORMAL HIGH (ref 65–99)
Glucose-Capillary: 155 mg/dL — ABNORMAL HIGH (ref 65–99)
Glucose-Capillary: 115 mg/dL — ABNORMAL HIGH (ref 65–99)

## 2016-03-11 LAB — BLOOD GAS, ARTERIAL
Acid-Base Excess: 12.7 mmol/L — ABNORMAL HIGH (ref 0.0–2.0)
BICARBONATE: 37.5 mmol/L — AB (ref 20.0–28.0)
FIO2: 0.3
MECHANICAL RATE: 12
MECHVT: 450 mL
O2 SAT: 99.2 %
PATIENT TEMPERATURE: 37
PCO2 ART: 47 mmHg (ref 32.0–48.0)
PEEP/CPAP: 5 cmH2O
PO2 ART: 131 mmHg — AB (ref 83.0–108.0)
pH, Arterial: 7.51 — ABNORMAL HIGH (ref 7.350–7.450)

## 2016-03-11 LAB — CBC
HEMATOCRIT: 17.4 % — AB (ref 35.0–47.0)
Hemoglobin: 5.7 g/dL — ABNORMAL LOW (ref 12.0–16.0)
MCH: 31.3 pg (ref 26.0–34.0)
MCHC: 32.7 g/dL (ref 32.0–36.0)
MCV: 95.7 fL (ref 80.0–100.0)
Platelets: 85 10*3/uL — ABNORMAL LOW (ref 150–440)
RBC: 1.82 MIL/uL — ABNORMAL LOW (ref 3.80–5.20)
RDW: 20.3 % — AB (ref 11.5–14.5)
WBC: 7.6 10*3/uL (ref 3.6–11.0)

## 2016-03-11 LAB — MAGNESIUM: Magnesium: 2 mg/dL (ref 1.7–2.4)

## 2016-03-11 LAB — PHOSPHORUS: Phosphorus: 2.4 mg/dL — ABNORMAL LOW (ref 2.5–4.6)

## 2016-03-11 LAB — PREPARE RBC (CROSSMATCH)

## 2016-03-11 MED ORDER — SODIUM CHLORIDE 0.9 % IV SOLN
Freq: Once | INTRAVENOUS | Status: AC
  Administered 2016-03-11: 07:00:00 via INTRAVENOUS

## 2016-03-11 MED ORDER — EPINEPHRINE HCL 1 MG/ML IJ SOLN
0.5000 ug/min | INTRAMUSCULAR | Status: DC
  Administered 2016-03-11: 0.5 ug/min via INTRAVENOUS
  Filled 2016-03-11 (×2): qty 4

## 2016-03-11 MED ORDER — ATROPINE SULFATE 1 MG/ML IJ SOLN
0.4000 mg | Freq: Once | INTRAMUSCULAR | Status: AC
  Administered 2016-03-11: 0.4 mg via INTRAVENOUS

## 2016-03-11 MED ORDER — DOPAMINE-DEXTROSE 3.2-5 MG/ML-% IV SOLN
0.0000 ug/kg/min | INTRAVENOUS | Status: DC
  Administered 2016-03-11: 2 ug/kg/min via INTRAVENOUS
  Administered 2016-03-11: 5 ug/kg/min via INTRAVENOUS
  Filled 2016-03-11: qty 250

## 2016-03-11 MED ORDER — ATROPINE SULFATE 1 MG/10ML IJ SOSY
PREFILLED_SYRINGE | INTRAMUSCULAR | Status: AC
  Administered 2016-03-11: 1 mg
  Filled 2016-03-11: qty 10

## 2016-03-11 NOTE — Progress Notes (Signed)
Pharmacy Antibiotic Note  Teresa Lutz is a 81 y.o. female admitted on 02/24/2016 with sepsis.  Pharmacy has been consulted for Zosyn dosing.  Plan:   Will continue  patient on Zosyn 3.375 IV every 12 hours. Patient is on hemodialysis for ESRD.  Height: 5\' 5"  (165.1 cm) Weight: 190 lb 7.6 oz (86.4 kg) IBW/kg (Calculated) : 57  Temp (24hrs), Avg:98 F (36.7 C), Min:97 F (36.1 C), Max:99.1 F (37.3 C)   Recent Labs Lab 03/07/2016 1333 03/22/2016 1901 03/09/16 0458 03/10/16 0449 03/11/16 0418  WBC 17.3*  --  19.8* 11.0 7.6  CREATININE 2.67*  --  2.85* 2.42* 2.80*  LATICACIDVEN 2.3* 1.4  --   --   --     Estimated Creatinine Clearance: 17.7 mL/min (by C-G formula based on SCr of 2.8 mg/dL (H)).    Allergies  Allergen Reactions  . Celebrex [Celecoxib] Hives    Antimicrobials this admission: 9/14 Zosyn >>  9/14 Vancomycin >> 9/15  Dose adjustments this admission:   Microbiology results: 9/14 BCx: pending  Thank you for allowing pharmacy to be a part of this patient's care.  Larene Beach, PharmD  Clinical Pharmacist 03/11/2016 8:52 AM

## 2016-03-11 NOTE — Progress Notes (Signed)
[facility]  ROUNDING NOTE   Subjective:   Hemodialysis yesterday. UF of 2.5 litres. However patient into a junctional rhythm during treatment.   This morning patient became bradycardic. Atropine x 2. Epinephrine gtt ordered Remains on norepinephrine gtt and vasopressin gtt.   Hemoglobin of 5.5 - PRBC ordered.   Made DNR this morning.   Objective:  Vital signs in last 24 hours:  Temp:  [97 F (36.1 C)-99.1 F (37.3 C)] 97.5 F (36.4 C) (09/17 0710) Pulse Rate:  [49-77] 69 (09/17 0710) Resp:  [12-18] 14 (09/17 0710) BP: (76-153)/(39-74) 136/65 (09/17 0630) SpO2:  [94 %-100 %] 100 % (09/17 0710) FiO2 (%):  [30 %] 30 % (09/17 0500) Weight:  [86.4 kg (190 lb 7.6 oz)] 86.4 kg (190 lb 7.6 oz) (09/17 0432)  Weight change: -4.3 kg (-9 lb 7.7 oz) Filed Weights   03/09/16 0521 03/10/16 0457 03/11/16 0432  Weight: 89.3 kg (196 lb 13.9 oz) 90.7 kg (199 lb 15.3 oz) 86.4 kg (190 lb 7.6 oz)    Intake/Output: I/O last 3 completed shifts: In: 3553.5 [I.V.:1658; NG/GT:1345.5; IV Piggyback:550] Out: 2500 [Other:2500]   Intake/Output this shift:  No intake/output data recorded.  Physical Exam: General: Critically ill  Head: +ETT, +OGT  Eyes: Eyes open  Neck: Left peripheral EJ line  Lungs:  PRVC FIO2 30%  Heart: Regular rate and rhythm  Abdomen:  Soft, nontender, obese  Extremities:  +++ peripheral edema.  Neurologic: Intubated, sedated  Skin: No lesions  Access: Left AVF    Basic Metabolic Panel:  Recent Labs Lab 02/26/2016 1333 03/09/16 0458 03/10/16 0449 03/11/16 0418  NA 140 137 138 137  K 3.4* 3.0* 3.3* 4.0  CL 98* 97* 98* 98*  CO2 35* 31 29 33*  GLUCOSE 109* 170* 143* 137*  BUN 21* 23* 20 28*  CREATININE 2.67* 2.85* 2.42* 2.80*  CALCIUM 8.5* 8.0* 8.2* 9.4  MG  --   --   --  2.0  PHOS  --   --   --  2.4*    Liver Function Tests:  Recent Labs Lab 03/16/2016 1333  AST 35  ALT 63*  ALKPHOS 122  BILITOT 0.7  PROT 4.3*  ALBUMIN 1.5*   No  results for input(s): LIPASE, AMYLASE in the last 168 hours. No results for input(s): AMMONIA in the last 168 hours.  CBC:  Recent Labs Lab 03/12/2016 1333 03/09/16 0458 03/10/16 0449 03/11/16 0418  WBC 17.3* 19.8* 11.0 7.6  NEUTROABS 15.6*  --   --   --   HGB 9.6* 9.1* 7.4* 5.7*  HCT 29.8* 27.9* 22.4* 17.4*  MCV 97.0 96.8 94.8 95.7  PLT 93* 108* 94* 85*    Cardiac Enzymes:  Recent Labs Lab 03/02/2016 1333 03/05/2016 1901  TROPONINI 0.04* 0.13*    BNP: Invalid input(s): POCBNP  CBG:  Recent Labs Lab 03/10/16 1602 03/10/16 1956 03/10/16 2350 03/11/16 0346 03/11/16 0728  GLUCAP 133* 144* 127* 138* 108*    Microbiology: Results for orders placed or performed during the hospital encounter of 02/28/2016  Blood culture (single)     Status: None (Preliminary result)   Collection Time: 03/23/2016  1:37 PM  Result Value Ref Range Status   Specimen Description BLOOD RIGHT AC  Final   Special Requests   Final    BOTTLES DRAWN AEROBIC AND ANAEROBIC AER 10ML ANA 9ML   Culture NO GROWTH 2 DAYS  Final   Report Status PENDING  Incomplete  MRSA PCR Screening  Status: None   Collection Time: 03/09/16 12:20 AM  Result Value Ref Range Status   MRSA by PCR NEGATIVE NEGATIVE Final    Comment:        The GeneXpert MRSA Assay (FDA approved for NASAL specimens only), is one component of a comprehensive MRSA colonization surveillance program. It is not intended to diagnose MRSA infection nor to guide or monitor treatment for MRSA infections.   Culture, blood (single) w Reflex to ID Panel     Status: None (Preliminary result)   Collection Time: 03/09/16 10:10 AM  Result Value Ref Range Status   Specimen Description BLOOD DRAWN BY DIALYSIS  Final   Special Requests BOTTLES DRAWN AEROBIC AND ANAEROBIC  11CC  Final   Culture NO GROWTH < 24 HOURS  Final   Report Status PENDING  Incomplete    Coagulation Studies:  Recent Labs  03/07/2016 1333 02/24/2016 1901  LABPROT 24.1*  25.4*  INR 2.12 2.27    Urinalysis:  Recent Labs  03/20/2016 1902  COLORURINE AMBER*  LABSPEC 1.015  PHURINE 6.0  GLUCOSEU 50*  HGBUR 3+*  BILIRUBINUR NEGATIVE  KETONESUR NEGATIVE  PROTEINUR 100*  NITRITE NEGATIVE  LEUKOCYTESUR TRACE*      Imaging: Dg Chest Port 1 View  Result Date: 03/10/2016 CLINICAL DATA:  Acute respiratory failure. Subsequent encounter. Intubated patient. EXAM: PORTABLE CHEST 1 VIEW COMPARISON:  None 15 17 FINDINGS: Moderate right pleural effusion. Central vascular congestion and interstitial thickening. Streaky lung base opacities. No new lung opacities. No pneumothorax. Endotracheal tube and nasal/ orogastric tube are well positioned. IMPRESSION: 1. No significant change from previous day's study. Moderate right pleural effusion. 2. Interstitial thickening streaky lung base opacity is likely combination of mild pulmonary edema and lung base atelectasis. Electronically Signed   By: Lajean Manes M.D.   On: 03/10/2016 07:16     Medications:   . epinephrine    . fentaNYL infusion INTRAVENOUS 75 mcg/hr (03/10/16 1810)  . norepinephrine 4 mcg/min (03/11/16 0803)  . vasopressin (PITRESSIN) infusion - *FOR SHOCK* Stopped (03/11/16 0544)   . albumin human  25 g Intravenous Q8H  . chlorhexidine gluconate (MEDLINE KIT)  15 mL Mouth Rinse BID  . chlorhexidine gluconate (MEDLINE KIT)  15 mL Mouth Rinse BID  . docusate  100 mg Oral Daily  . famotidine (PEPCID) IV  20 mg Intravenous Q24H  . feeding supplement (VITAL HIGH PROTEIN)  1,000 mL Per Tube Q24H  . heparin  5,000 Units Subcutaneous Q8H  . hydrocortisone sod succinate (SOLU-CORTEF) inj  50 mg Intravenous Q6H  . insulin aspart  0-15 Units Subcutaneous Q4H  . mouth rinse  15 mL Mouth Rinse QID  . midodrine  10 mg Per Tube TID WC  . piperacillin-tazobactam (ZOSYN)  IV  3.375 g Intravenous Q12H  . sennosides  5 mL Oral BID   sodium chloride, fentaNYL (SUBLIMAZE) injection, midazolam  Assessment/ Plan:   Teresa Lutz is a 81 y.o. black female with atrial fibrillation, anemia, coronary artery disease, diabetes mellitus type 2, hypertension, hyperlipidemia, history of breast cancer   TTS [facility]  1. End Stage Renal Disease: patient with anasarca. Albumin at 1.5. Nephrotic syndrome. However malnutrion and sepsis are contributing.  Unable to get much ultrafiltration on last two treatments Overall prognosis is poor Continue IV albumin for now.   2. Sepsis: with hypotension requiring vasopressors. Unclear source. Blood cultures no growth.  - Norepinephrine, vasopressin and now epinephrine - empiric vancomycin and zosyn.  - midodrine  3. Anemia of chronic kidney disease: gets mircera as outpatient. Received EPO on 9/16 - PRBC ordered fro today 9/17  4. Secondary Hyperparathyroidism: with hypophosphatemia - most likely due to malnutrition and chronic illness.  - holding binders.    LOS: [place], Teresa Lutz 9/17/20178:22 AM

## 2016-03-11 NOTE — Progress Notes (Signed)
Patient bradycardia returned. RN only able to find pulse with difficulty (fading in and out, also just normally difficulty to find due to +3 edema in upper extremities). MD present at bedside, ordered one more dose of atropine. MD made patient a DNR after discussion with family on the phone, to change vasopressor to epinephrine drip. RN did clarify with MD about epinephrine order, MD states to titrate for BP only not for HR. Team will continue to monitor.

## 2016-03-11 NOTE — Progress Notes (Signed)
[facility] Consultation      Name: Ligia Scanlon MRN: [id number] DOB: July 28, 1934    ADMISSION DATE:  02/29/2016   CHIEF COMPLAINT:  hypotension.    HISTORY OF PRESENT ILLNESS:   The patient is a 81 yo female with a history of ESRD. She had her permacath removed on 8/28 due to working dialysis fistula. She has a history of afib and diast CHF, anemia of chronic disease, sleep apnea on CPAP, hypothyroidism, chronic leg edema and history of breast cancer. She was recently seen in the ER due to fall and hitting of the head, nothing significant was found, then the patient was discharged.   She was seen at HD today and it was noted that she was hypotensive. Her BP was 50/40, she was not dialyzed and sent to the ER. While in the ER she has been persistently hypertensive and required boluses of fluid to keep her BP elevated.  During that time she has become volume overloaded and she is now requiring a 100% NRB.   Currently on my eval, she is not able to give a history due to dyspnea, she is in trendelenburg to keep her BP elevated. She is not able to give history due to dyspnea and lying nearly upside down.    Hosp Course 9/15 remains intubated,sedated, full vent support 9/16 remains intubated 9/17 near cardiac arrest x2, low HR, low BP, hbf 5 Patient in process of dying  I discussed with daughter [name], patient is now DNR  Allergies  Allergen Reactions  . Celebrex [Celecoxib] Hives   REVIEW OF SYSTEMS:   Unable to provide due to critical illness.    VITAL SIGNS: Temp:  [97 F (36.1 C)-99.1 F (37.3 C)] 97.5 F (36.4 C) (09/17 0710) Pulse Rate:  [49-77] 69 (09/17 0710) Resp:  [12-18] 14 (09/17 0710) BP: (76-153)/(39-74) 136/65 (09/17 0630) SpO2:  [94 %-100 %] 100 % (09/17 0710) FiO2 (%):  [30 %] 30 % (09/17 0500) Weight:  [190 lb 7.6 oz (86.4 kg)] 190 lb 7.6 oz (86.4 kg) (09/17 0432) HEMODYNAMICS:   VENTILATOR SETTINGS: Vent Mode: PRVC FiO2 (%):   [30 %] 30 % Set Rate:  [12 bmp] 12 bmp Vt Set:  [450 mL] 450 mL PEEP:  [5 cmH20] 5 cmH20 Plateau Pressure:  [16 cmH20-18 cmH20] 16 cmH20 INTAKE / OUTPUT:  Intake/Output Summary (Last 24 hours) at 03/11/16 0757 Last data filed at 03/11/16 0700  Gross per 24 hour  Intake          2850.96 ml  Output             2500 ml  Net           350.96 ml    Physical Examination:   VS: BP 136/65   Pulse 69   Temp 97.5 F (36.4 C) (Rectal)   Resp 14   Ht 5\' 5"  (1.651 m)   Wt 190 lb 7.6 oz (86.4 kg)   SpO2 100%   BMI 29.83 kg/m   General Appearance:gcs<8T, intubated Neuro:without focal findings, mental status reduced.  HEENT: PERRLA, EOM intact, no ptosis. Pulmonary: bilateral crackles.  CardiovascularNormal S1,S2.  No m/r/g.    Abdomen: Benign, Soft, non-tender. Renal:  No costovertebral tenderness  GU:  Not performed at this time. Endoc: No evident thyromegaly, no signs of acromegaly. Skin:   warm, no rashes, no ecchymosis  Extremities: normal, no cyanosis, clubbing, no edema, warm with normal capillary refill.    LABS: Reviewed  LABORATORY PANEL:   CBC  Recent Labs Lab 03/11/16 0418  WBC 7.6  HGB 5.7*  HCT 17.4*  PLT 85*    Chemistries   Recent Labs Lab 03/17/2016 1333  03/11/16 0418  NA 140  < > 137  K 3.4*  < > 4.0  CL 98*  < > 98*  CO2 35*  < > 33*  GLUCOSE 109*  < > 137*  BUN 21*  < > 28*  CREATININE 2.67*  < > 2.80*  CALCIUM 8.5*  < > 9.4  MG  --   --  2.0  PHOS  --   --  2.4*  AST 35  --   --   ALT 63*  --   --   ALKPHOS 122  --   --   BILITOT 0.7  --   --   < > = values in this interval not displayed.   Recent Labs Lab 03/10/16 1116 03/10/16 1602 03/10/16 1956 03/10/16 2350 03/11/16 0346 03/11/16 0728  GLUCAP 122* 133* 144* 127* 138* 108*    Recent Labs Lab 03/09/16 0052 03/10/16 0436 03/11/16 0500  PHART 7.65* 7.62* 7.51*  PCO2ART 30* 34 47  PO2ART 198* 179* 131*    Recent Labs Lab 03/09/2016 1333  AST 35  ALT 63*    ALKPHOS 122  BILITOT 0.7  ALBUMIN 1.5*    Cardiac Enzymes  Recent Labs Lab 03/07/2016 1901  TROPONINI 0.13*    RADIOLOGY:  Dg Chest Port 1 View  Result Date: 03/10/2016 CLINICAL DATA:  Acute respiratory failure. Subsequent encounter. Intubated patient. EXAM: PORTABLE CHEST 1 VIEW COMPARISON:  None 15 17 FINDINGS: Moderate right pleural effusion. Central vascular congestion and interstitial thickening. Streaky lung base opacities. No new lung opacities. No pneumothorax. Endotracheal tube and nasal/ orogastric tube are well positioned. IMPRESSION: 1. No significant change from previous day's study. Moderate right pleural effusion. 2. Interstitial thickening streaky lung base opacity is likely combination of mild pulmonary edema and lung base atelectasis. Electronically Signed   By: Lajean Manes M.D.   On: 03/10/2016 07:16     ASSESSMENT/PLAN   81 yo female with history of ESRD, now with septic shock and hypotension. Complicated by volume overload with acute resp failure.  Near cardiac arrest with multiorgan failure  PULMONARY A:Acute respiratory failure.  OSA. P:   Continue vent support  CARDIOVASCULAR A: Severe septic shock with hypotension.  Volume overload acute on chronic.  P:  Given ESRD, will limit IVF, Wean off vasopressors, keep MAP>65 -start epi infusion  RENAL A:  ESRD on HD.  P:   Hold HD  GASTROINTESTINAL continue TF's   HEMATOLOGIC A:  Anemia of chronic disease.  P:  Continue to monitor CBC.  Transfuse as needed.   INFECTIOUS A:  Septic shock.  P:   Check Blood cx, monitor cultures. Source uncertain +sacral decub.   Micro/culture results:  BCx2 -- UC -- Sputum--  Antibiotics: Zosyn 02/24/2016>> Vancomycin. 03/05/2016>>  ENDOCRINE - ICU hypoglycemic\Hyperglycemia protocol    NEUROLOGIC - intubated and sedated - minimal sedation to achieve a RASS goal: -1    Best Practices  DVT Prophylaxis: hold Heparin SQ due to low HGB and  bleeding. GI Prophylaxis: Famotidine.    I have personally obtained a history, examined the patient, evaluated Pertinent laboratory and RadioGraphic/imaging results, and  formulated the assessment and plan   The Patient requires high complexity decision making for assessment and support, frequent evaluation and titration of therapies, application of advanced monitoring  technologies and extensive interpretation of multiple databases. Critical Care Time devoted to patient care services described in this note is 45 minutes.   Overall, patient is critically ill, prognosis is guarded.  Patient with Multiorgan failure and at high risk for cardiac arrest and death.  Multiorgan failure-near cardiac arrest, Daughter updated  After further discussion, patient is now DNR  Corrin Parker, M.D.  [facility]  Medical Director [place] Director [facility] Cardio-Pulmonary Department

## 2016-03-11 NOTE — Progress Notes (Signed)
RN confirmed with MD, Dr. Mortimer Fries, that he did not want a wake up assessment today.

## 2016-03-11 NOTE — Progress Notes (Signed)
Upon assessment, orogastric tube, appears out 3 cm from yesterdays charted measurement and morning confirmation of placement with offgoing RN. RN could not hear tube in belly with auscultation of tube. RN stopped tube feedings. RN informed MD, Dr. Mortimer Fries, in person and per orders advanced tube 10 cm and got an abdominal x-ray to confirm placement before restarting tube feedings.

## 2016-03-11 NOTE — Progress Notes (Signed)
During change of shift, offgoing RN and oncoming RN rolled patient to clean up small smear of bowel movement. Patient was low sinus rhythm before roll and bradycardia worsened to 30s during roll, BP dropped to 80s SBP, MAP still above 55 (was 67). RNs aborted roll and MD and NP came into room to help assess patient. Pulse never lost, levophed and vasopressin restarted, atropine given x1. Levophed titrated down as BP and HR improved. Team will continue to monitor.

## 2016-03-11 NOTE — Progress Notes (Deleted)
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  INTERVAL HISTORY: Patient returns to clinic today for repeat laboratory work, further evaluation, and consideration of Procrit. She continues to feel well and is asymptomatic. She does not complain of weakness or fatigue today. She has no neurologic complaints. She denies any recent fevers or illnesses. She has no chest pain or shortness of breath. She denies any nausea, vomiting, constipation, or diarrhea. She has no melena or hematochezia. She has no urinary complaints. Patient offers no specific complaints today.  REVIEW OF SYSTEMS:   Review of Systems  Constitutional: Negative.  Negative for malaise/fatigue.  Respiratory: Negative.  Negative for shortness of breath.   Cardiovascular: Negative.   Gastrointestinal: Negative.  Negative for blood in stool and melena.  Neurological: Negative.  Negative for weakness.    As per HPI. Otherwise, a complete review of systems is negatve.  PAST MEDICAL HISTORY: Past Medical History:  Diagnosis Date  . Anemia of chronic disease   . Arthritis 1990  . Breast cancer ([place]) 2012   Right stage 1  . Chronic atrial fibrillation ([place]) 2014   a. Eliquis 5 mg bid; CHADSVASc 5 (CHF, HTN, age x 2, female)  . Chronic diastolic CHF (congestive heart failure) ([facility])    a. EF 60-65% by echo in 04/2015  . CKD (chronic kidney disease), stage IV ([place])   . Goiter, specified as simple   . Hemorrhoid   . Hyperlipidemia   . Hypertension 1960  . Hypopotassemia   . Lower extremity edema     . Malignant neoplasm of upper-inner quadrant of female breast ([place])    right  . Pulmonary HTN ([place])    a. PA peak pressure 41 mmHg by echo in 04/2015  . Ulcer 1980  . Unspecified hypothyroidism   . Unspecified vitamin D deficiency     PAST SURGICAL HISTORY: Past Surgical History:  Procedure Laterality Date  . AV FISTULA PLACEMENT Left 09/02/2015   Procedure: INSERTION OF ARTERIOVENOUS (AV) GORE-TEX GRAFT ARM;  Surgeon: Katha Cabal, MD;  Location: [facility] ORS;  Service: Vascular;  Laterality: Left;  . BREAST LUMPECTOMY Right 50 years ago  . BREAST LUMPECTOMY Right 2012   invasive   . CARDIAC CATHETERIZATION  06/2010   [facility]: Mild nonobstructive coronary artery disease with normal ejection fraction.  [name] CARDIOVERSION    . COLONOSCOPY  2011  . mammosite balloon placement  Right 11/2010  . PERIPHERAL VASCULAR CATHETERIZATION N/A 08/29/2015   Procedure: Dialysis/Perma Catheter Insertion;  Surgeon: Algernon Huxley, MD;  Location: [facility] CV LAB;  Service: Cardiovascular;  Laterality: N/A;  . PERIPHERAL VASCULAR CATHETERIZATION N/A 02/20/2016   Procedure: Dialysis/Perma Catheter Removal;  Surgeon: Algernon Huxley, MD;  Location: [facility] LAB;  Service: Cardiovascular;  Laterality: N/A;    FAMILY HISTORY Family History  Problem Relation Age of Onset  . Cancer Mother     ovarian  . Hypertension Mother   . Stroke Father   . Cancer Brother     colon       ADVANCED DIRECTIVES:    HEALTH MAINTENANCE: Social History  Substance Use Topics  . Smoking status: Never  Smoker  . Smokeless tobacco: Never Used  . Alcohol use No     Colonoscopy:  PAP:  Bone density:  Lipid panel:  Allergies  Allergen Reactions  . Celebrex [Celecoxib] Hives    No current facility-administered medications for this visit.    No current outpatient prescriptions on file.   Facility-Administered Medications Ordered in Other Visits  Medication Dose Route Frequency Provider Last Rate Last Dose   . 0.9 %  sodium chloride infusion  250 mL Intravenous PRN Laverle Hobby, MD   250 mL at 03/11/16 1200  . albumin human 25 % solution 25 g  25 g Intravenous Q8H Lavonia Dana, MD   25 g at 03/11/16 2111  . chlorhexidine gluconate (MEDLINE KIT) (PERIDEX) 0.12 % solution 15 mL  15 mL Mouth Rinse BID Bincy S Varughese, NP   15 mL at 03/11/16 2044  . docusate (COLACE) 50 MG/5ML liquid 100 mg  100 mg Oral Daily Flora Lipps, MD   100 mg at 03/11/16 0937  . DOPamine (INTROPIN) 800 mg in dextrose 5 % 250 mL (3.2 mg/mL) infusion  0-20 mcg/kg/min Intravenous Titrated Mikael Spray, NP   5 mcg/kg/min at 03/11/16 2112  . EPINEPHrine (ADRENALIN) 4 mg in dextrose 5 % 250 mL (0.016 mg/mL) infusion  0.5-20 mcg/min Intravenous Titrated Flora Lipps, MD   6 mcg/min at 03/11/16 1745  . famotidine (PEPCID) IVPB 20 mg premix  20 mg Intravenous Q24H Laverle Hobby, MD   20 mg at 03/10/16 2322  . feeding supplement (VITAL HIGH PROTEIN) liquid 1,000 mL  1,000 mL Per Tube Q24H Flora Lipps, MD   1,000 mL at 03/11/16 1427  . fentaNYL (SUBLIMAZE) injection 100 mcg  100 mcg Intravenous Q2H PRN Mikael Spray, NP   100 mcg at 03/11/16 0510  . fentaNYL 2512mg in NS 2565m(1027mml) infusion-PREMIX  25-400 mcg/hr Intravenous Continuous PraLaverle HobbyD   50 mcg/hr at 03/11/16 1929  . heparin injection 5,000 Units  5,000 Units Subcutaneous Q8H PraLaverle HobbyD   5,000 Units at 03/11/16 1421  . hydrocortisone sodium succinate (SOLU-CORTEF) 100 MG injection 50 mg  50 mg Intravenous Q6H KurFlora LippsD   50 mg at 03/11/16 1753  . insulin aspart (novoLOG) injection 0-15 Units  0-15 Units Subcutaneous Q4H PraLaverle HobbyD   3 Units at 03/11/16 1229  . MEDLINE mouth rinse  15 mL Mouth Rinse QID Bincy S Varughese, NP   15 mL at 03/11/16 1621  . midazolam (VERSED) injection 2-4 mg  2-4 mg Intravenous Q2H PRN MagMikael SprayP   2 mg at 03/11/16 0355  . midodrine (PROAMATINE) tablet 10 mg   10 mg Per Tube TID WC SarLavonia DanaD   10 mg at 03/11/16 1621  . norepinephrine (LEVOPHED) 4mg89m D5W 250mL23mmix infusion  0-40 mcg/min Intravenous Titrated PradeLaverle Hobby  Stopped at 03/11/16 1000  . piperacillin-tazobactam (ZOSYN) IVPB 3.375 g  3.375 g Intravenous Q12H Sheema M Hallaji, RPH   3.375 g at 03/11/16 0938  . sennosides (SENOKOT) 8.8 MG/5ML syrup 5 mL  5 mL Oral BID KuriaFlora Lipps  5 mL at 03/11/16 0937  . vasopressin (PITRESSIN) 40 Units in sodium chloride 0.9 % 250 mL (0.16 Units/mL) infusion  0.03 Units/min Intravenous Continuous KuriaFlora Lipps  Stopped at 03/11/16 1100    OBJECTIVE: There were no vitals filed for this visit.   There is no height or weight on file to calculate BMI.  ECOG FS:0 - Asymptomatic  General: Well-developed, well-nourished, no acute distress. Eyes: Pink conjunctiva, anicteric sclera. Lungs: Clear to auscultation bilaterally. Heart: Regular rate and rhythm. No rubs, murmurs, or gallops. Abdomen: Soft, nontender, nondistended. No organomegaly noted, normoactive bowel sounds. Musculoskeletal: No edema, cyanosis, or clubbing. Neuro: Alert, answering all questions appropriately. Cranial nerves grossly intact. Skin: No rashes or petechiae noted. Psych: Normal affect.   LAB RESULTS:  Lab Results  Component Value Date   NA 137 03/11/2016   K 4.0 03/11/2016   CL 98 (L) 03/11/2016   CO2 33 (H) 03/11/2016   GLUCOSE 137 (H) 03/11/2016   BUN 28 (H) 03/11/2016   CREATININE 2.80 (H) 03/11/2016   CALCIUM 9.4 03/11/2016   PROT 4.3 (L) 02/27/2016   ALBUMIN 1.5 (L) 03/03/2016   AST 35 03/20/2016   ALT 63 (H) 02/24/2016   ALKPHOS 122 03/12/2016   BILITOT 0.7 03/09/2016   GFRNONAA 15 (L) 03/11/2016   GFRAA 17 (L) 03/11/2016    Lab Results  Component Value Date   WBC 7.6 03/11/2016   NEUTROABS 15.6 (H) 03/11/2016   HGB 9.5 (L) 03/11/2016   HCT 28.5 (L) 03/11/2016   MCV 95.7 03/11/2016   PLT 85 (L) 03/11/2016      STUDIES: Dg Chest 1 View  Result Date: 02/24/2016 CLINICAL DATA:  81 year old female intubated. Initial encounter. EXAM: CHEST 1 VIEW COMPARISON:  1431 hours today. FINDINGS: Portable AP supine view at 2120 hours. Endotracheal tube tip about 10 mm above the carina. Similar lung volumes. Stable cardiac size and mediastinal contours. Veiling opacity persists in the right lung suggesting pleural effusion. Smaller veiling opacity now on the left lung. No definite pneumothorax on this supine view. IMPRESSION: 1. Intubated with endotracheal tube tip about 10 mm from the carina. 2. Right greater than left pleural effusions. Associated right lung base atelectasis or consolidation. Electronically Signed   By: Genevie Ann M.D.   On: 03/15/2016 22:15   Dg Abd 1 View  Result Date: 02/27/2016 CLINICAL DATA:  Orogastric tube placement EXAM: ABDOMEN - 1 VIEW COMPARISON:  None. FINDINGS: The orogastric tube extends into the stomach, coiled in the fundus. IMPRESSION: OG tube extends into the stomach. Electronically Signed   By: Andreas Newport M.D.   On: 02/29/2016 23:55   Ct Head Wo Contrast  Result Date: 03/02/2016 CLINICAL DATA:  Recent fall, current blood thinner use EXAM: CT HEAD WITHOUT CONTRAST TECHNIQUE: Contiguous axial images were obtained from the base of the skull through the vertex without intravenous contrast. COMPARISON:  None. FINDINGS: Brain: No evidence of acute infarction, hemorrhage, hydrocephalus, extra-axial collection or mass lesion/mass effect. Mild atrophic changes are noted. Vascular: No hyperdense vessel or unexpected calcification. Skull: Normal. Negative for fracture or focal lesion. Mild hyperostosis is noted. Sinuses/Orbits: No acute finding. IMPRESSION: No acute intracranial abnormality noted. Mild atrophic changes are seen. Electronically Signed   By: Inez Catalina M.D.   On: 03/02/2016 07:32   Dg Chest Port 1 View  Result Date: 03/10/2016 CLINICAL DATA:  Acute respiratory failure.  Subsequent encounter. Intubated patient. EXAM: PORTABLE CHEST 1 VIEW COMPARISON:  None 15 17 FINDINGS: Moderate right pleural effusion. Central vascular congestion and interstitial thickening. Streaky lung base opacities. No new lung opacities. No pneumothorax. Endotracheal tube and nasal/ orogastric tube are well positioned. IMPRESSION: 1. No significant change from previous day's study. Moderate right pleural effusion. 2. Interstitial thickening streaky lung base opacity is likely combination of mild pulmonary edema and lung base atelectasis. Electronically Signed  By: Lajean Manes M.D.   On: 03/10/2016 07:16   Dg Chest Port 1 View  Result Date: 03/09/2016 CLINICAL DATA:  Dyspnea. EXAM: PORTABLE CHEST 1 VIEW COMPARISON:  Yesterday at 2120 hour FINDINGS: Endotracheal tube is 1.4 cm from the carina. Enteric tube in place, tip and side port below the diaphragm. Right pleural effusion is similar, left pleural effusion has improved. Associated bibasilar opacities, favoring atelectasis. Cardiomediastinal contours are unchanged. Chronic degenerative change of both shoulders. IMPRESSION: Improved left pleural effusion. Unchanged right pleural effusion. Bibasilar opacities are again seen. Endotracheal and enteric tubes in place. Electronically Signed   By: Jeb Levering M.D.   On: 03/09/2016 04:10   Dg Chest Port 1 View  Result Date: 02/26/2016 CLINICAL DATA:  Shortness of breath. [place] out of bed onto the floor 1 week ago. EXAM: PORTABLE CHEST 1 VIEW COMPARISON:  08/22/2015. FINDINGS: Mildly enlarged cardiac silhouette with a mild increase in size. Interval moderate-sized right pleural effusion. Interval patchy and linear density in both mid lung zones. Interval airspace opacity at the medial left lung base. Diffuse osteopenia. Thoracic spine degenerative changes. IMPRESSION: 1. Moderate-sized right pleural effusion. 2. Bilateral atelectasis and possible pneumonia. 3. Mild cardiomegaly with mild progression.  Electronically Signed   By: Claudie Revering M.D.   On: 03/10/2016 14:54   Dg Abd Portable 1v  Result Date: 03/11/2016 CLINICAL DATA:  Orogastric tube placement EXAM: PORTABLE ABDOMEN - 1 VIEW COMPARISON:  Portable exam 1158 hours compared to 02/28/2016 FINDINGS: Tip of endotracheal tube projects 2.2 cm above carina. Orogastric tube coiled in proximal stomach. External pacing leads project over abdomen and LEFT lower chest. Nonobstructive bowel gas pattern. Bibasilar pulmonary opacities question atelectasis versus infiltrate with small RIGHT pleural effusion. IMPRESSION: Orogastric tube coiled in proximal stomach. Electronically Signed   By: Lavonia Dana M.D.   On: 03/11/2016 12:11   Dg Hip Unilat W Or Wo Pelvis 2-3 Views Right  Result Date: 03/07/2016 CLINICAL DATA:  Right hip pain after falling out of bed 1 week ago. EXAM: DG HIP (WITH OR WITHOUT PELVIS) 2-3V RIGHT COMPARISON:  None. FINDINGS: Diffuse osteopenia. No fracture or dislocation seen. Extensive arterial calcifications. Small calcified uterine fibroids on the right. Lower lumbar spine degenerative changes. IMPRESSION: 1. No fracture or dislocation. 2. Lower lumbar spine degenerative changes. 3. Extensive atheromatous arterial calcifications. Electronically Signed   By: Claudie Revering M.D.   On: 03/11/2016 14:51   Mm Outside Films Mammo  Result Date: 03/07/2016 This examination belongs to an outside facility and is stored here for comparison purposes only.  Contact the originating outside institution for any associated report or interpretation.   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  PLAN:    1. Anemia secondary to chronic renal insufficiency: Secondary to chronic renal insufficiency. Patient's iron stores as well as the remainder of her workup was either negative or within normal limits. Patient's hemoglobin is greater than 10.0, therefore she  does not require Procrit today. Return to clinic every 4 weeks for laboratory work and 40,000 units of Procrit if her hemoglobin falls below 10.0. She will then return to clinic in 4 months for repeat laboratory work and further evaluation.  2. Stage Ia ER/PR positive adenocarcinoma of the right breat, unclear location: Patient had a lumpectomy on Nov 07, 2010.  3. Chronic renal insufficiency: Patient's creatinine appears to be approximately her baseline of 3.4. Treatment per nephrology.  If patient progresses to  dialysis, she likely will can receive her Procrit there and will no longer require follow-up. 4. Congestive heart failure: Patient's EF is estimated 28%.  Patient expressed understanding and was in agreement with this plan. She also understands that She can call clinic at any time with any questions, concerns, or complaints.    Lloyd Huger, MD   03/11/2016 9:37 PM

## 2016-03-11 NOTE — Progress Notes (Signed)
RN notified E Link RN about patient's heart rate sustaining now in the upper 30s (instead of the 40s-50s). Patient's blood pressure above SBP and MAP goals for patient and patient able to open eyes to voice, currently on epinephrine drip for BP only not HR (does not appear to be increasing HR either). E Link RN spoke with Margaree Mackintosh MD. MD aware, currently no new orders. Team will continue to monitor.

## 2016-03-11 NOTE — Progress Notes (Signed)
RN spoke to MD and NP about blood transfusion with difficulties with bradycardia. MD and NP both stated not transfusion reaction after assessment of patient. RN ordered to continue giving blood. Team will continue to monitor.

## 2016-03-12 ENCOUNTER — Ambulatory Visit: Payer: Medicare HMO

## 2016-03-12 ENCOUNTER — Inpatient Hospital Stay: Payer: Medicare HMO

## 2016-03-12 ENCOUNTER — Inpatient Hospital Stay: Payer: Medicare HMO | Admitting: Oncology

## 2016-03-12 DIAGNOSIS — N186 End stage renal disease: Secondary | ICD-10-CM

## 2016-03-12 DIAGNOSIS — I5033 Acute on chronic diastolic (congestive) heart failure: Secondary | ICD-10-CM

## 2016-03-12 DIAGNOSIS — I482 Chronic atrial fibrillation: Secondary | ICD-10-CM

## 2016-03-12 DIAGNOSIS — R001 Bradycardia, unspecified: Secondary | ICD-10-CM

## 2016-03-12 DIAGNOSIS — IMO0002 Reserved for concepts with insufficient information to code with codable children: Secondary | ICD-10-CM

## 2016-03-12 DIAGNOSIS — I498 Other specified cardiac arrhythmias: Secondary | ICD-10-CM

## 2016-03-12 LAB — BASIC METABOLIC PANEL
ANION GAP: 12 (ref 5–15)
BUN: 37 mg/dL — ABNORMAL HIGH (ref 6–20)
CALCIUM: 9.9 mg/dL (ref 8.9–10.3)
CHLORIDE: 95 mmol/L — AB (ref 101–111)
CO2: 29 mmol/L (ref 22–32)
Creatinine, Ser: 2.94 mg/dL — ABNORMAL HIGH (ref 0.44–1.00)
GFR calc Af Amer: 16 mL/min — ABNORMAL LOW (ref >60)
GFR calc non Af Amer: 14 mL/min — ABNORMAL LOW (ref >60)
GLUCOSE: 124 mg/dL — AB (ref 65–99)
POTASSIUM: 4.3 mmol/L (ref 3.5–5.1)
Sodium: 136 mmol/L (ref 135–145)

## 2016-03-12 LAB — GLUCOSE, CAPILLARY
GLUCOSE-CAPILLARY: 103 mg/dL — AB (ref 65–99)
GLUCOSE-CAPILLARY: 112 mg/dL — AB (ref 65–99)
Glucose-Capillary: 133 mg/dL — ABNORMAL HIGH (ref 65–99)
Glucose-Capillary: 103 mg/dL — ABNORMAL HIGH (ref 65–99)
Glucose-Capillary: 138 mg/dL — ABNORMAL HIGH (ref 65–99)

## 2016-03-12 LAB — MAGNESIUM: Magnesium: 2.1 mg/dL (ref 1.7–2.4)

## 2016-03-12 LAB — CBC
HEMATOCRIT: 27.4 % — AB (ref 35.0–47.0)
HEMOGLOBIN: 9.1 g/dL — AB (ref 12.0–16.0)
MCH: 30.5 pg (ref 26.0–34.0)
MCHC: 33.1 g/dL (ref 32.0–36.0)
MCV: 92 fL (ref 80.0–100.0)
Platelets: 61 10*3/uL — ABNORMAL LOW (ref 150–440)
RBC: 2.98 MIL/uL — ABNORMAL LOW (ref 3.80–5.20)
RDW: 19.4 % — AB (ref 11.5–14.5)
WBC: 15.5 10*3/uL — AB (ref 3.6–11.0)

## 2016-03-12 LAB — PHOSPHORUS: PHOSPHORUS: 3.1 mg/dL (ref 2.5–4.6)

## 2016-03-12 MED ORDER — ACETAMINOPHEN 325 MG PO TABS: 650.0000 mg | ORAL_TABLET | Freq: Four times a day (QID) | ORAL | Status: DC | PRN

## 2016-03-12 MED ORDER — ONDANSETRON HCL 4 MG/2ML IJ SOLN: 4.0000 mg | Freq: Four times a day (QID) | INTRAMUSCULAR | Status: DC | PRN

## 2016-03-12 MED ORDER — ACETAMINOPHEN 650 MG RE SUPP: 650.0000 mg | Freq: Four times a day (QID) | RECTAL | Status: DC | PRN

## 2016-03-12 MED ORDER — MORPHINE SULFATE (PF) 4 MG/ML IV SOLN: 5.0000 mg | INTRAVENOUS | Status: DC

## 2016-03-12 MED ORDER — HALOPERIDOL LACTATE 2 MG/ML PO CONC
0.5000 mg | ORAL | Status: DC | PRN
  Filled 2016-03-12: qty 0.3

## 2016-03-12 MED ORDER — FREE WATER
20.0000 mL | Status: DC
  Administered 2016-03-12 (×2): 20 mL

## 2016-03-12 MED ORDER — GLYCOPYRROLATE 0.2 MG/ML IJ SOLN
0.2000 mg | INTRAMUSCULAR | Status: DC | PRN
  Administered 2016-03-12: 0.2 mg via INTRAVENOUS
  Filled 2016-03-12: qty 1

## 2016-03-12 MED ORDER — HYDRALAZINE HCL 20 MG/ML IJ SOLN
10.0000 mg | Freq: Four times a day (QID) | INTRAMUSCULAR | Status: DC | PRN
  Administered 2016-03-12: 10 mg via INTRAVENOUS
  Filled 2016-03-12: qty 1

## 2016-03-12 MED ORDER — GLYCOPYRROLATE 1 MG PO TABS: 1.0000 mg | ORAL_TABLET | ORAL | Status: DC | PRN

## 2016-03-12 MED ORDER — BIOTENE DRY MOUTH MT LIQD: 15.0000 mL | OROMUCOSAL | Status: DC | PRN

## 2016-03-12 MED ORDER — SODIUM CHLORIDE 0.9% FLUSH
10.0000 mL | Freq: Two times a day (BID) | INTRAVENOUS | Status: DC
  Administered 2016-03-12 (×2): 10 mL

## 2016-03-12 MED ORDER — PRO-STAT SUGAR FREE PO LIQD
30.0000 mL | Freq: Two times a day (BID) | ORAL | Status: DC
  Administered 2016-03-12 (×2): 30 mL via ORAL

## 2016-03-12 MED ORDER — GLYCOPYRROLATE 0.2 MG/ML IJ SOLN: 0.2000 mg | INTRAMUSCULAR | Status: DC | PRN

## 2016-03-12 MED ORDER — HALOPERIDOL 0.5 MG PO TABS: 0.5000 mg | ORAL_TABLET | ORAL | Status: DC | PRN

## 2016-03-12 MED ORDER — POLYVINYL ALCOHOL 1.4 % OP SOLN: 1.0000 [drp] | Freq: Four times a day (QID) | OPHTHALMIC | Status: DC | PRN

## 2016-03-12 MED ORDER — AMIODARONE HCL 200 MG PO TABS
200.0000 mg | ORAL_TABLET | Freq: Every day | ORAL | Status: DC
  Administered 2016-03-12: 200 mg via ORAL
  Filled 2016-03-12: qty 1

## 2016-03-12 MED ORDER — VITAL HIGH PROTEIN PO LIQD
1000.0000 mL | ORAL | Status: DC
  Administered 2016-03-12: 1000 mL

## 2016-03-12 MED ORDER — HALOPERIDOL LACTATE 5 MG/ML IJ SOLN: 0.5000 mg | INTRAMUSCULAR | Status: DC | PRN

## 2016-03-12 MED ORDER — SODIUM CHLORIDE 0.9% FLUSH
10.0000 mL | INTRAVENOUS | Status: DC | PRN
  Administered 2016-03-12: 20 mL
  Filled 2016-03-12: qty 40

## 2016-03-12 MED ORDER — MORPHINE SULFATE (PF) 2 MG/ML IV SOLN
2.0000 mg | INTRAVENOUS | Status: DC | PRN
  Administered 2016-03-12: 2 mg via INTRAVENOUS
  Filled 2016-03-12: qty 1

## 2016-03-12 MED ORDER — ONDANSETRON 4 MG PO TBDP
4.0000 mg | ORAL_TABLET | Freq: Four times a day (QID) | ORAL | Status: DC | PRN
  Filled 2016-03-12: qty 1

## 2016-03-12 NOTE — Progress Notes (Signed)
  I had a very extensive discussion with DaughterKenney Houseman Patron- HCPOA) regarding the poor prognosis of her mother Teresa [facility].  Daughter after discussing with the rest of family decides to make her comfort care as she states that" she would not like her mother to suffer in this manner". Terminal Extubation orders have been placed. Informed Dr. Stevenson Clinch regarding the family's decision.   [facility]

## 2016-03-12 NOTE — Progress Notes (Signed)
HD cancelled per pt floor RN due to pt actively passing. Told RN to notify HD department and RN on call if pt condition changes. Candiss Norse MD aware.

## 2016-03-12 NOTE — Progress Notes (Signed)
[facility]  ROUNDING NOTE   Subjective:   Patient remains critically ill Intubated, sedated Requiring pressors Remains hypotensive and bradycardic Has massive fluid overload  Objective:  Vital signs in last 24 hours:  Temp:  [98.4 F (36.9 C)-99.9 F (37.7 C)] 99.3 F (37.4 C) (09/18 0600) Pulse Rate:  [35-53] 46 (09/18 0800) Resp:  [13-19] 15 (09/18 0800) BP: (68-147)/(46-93) 133/62 (09/18 0755) SpO2:  [88 %-100 %] 100 % (09/18 0800) FiO2 (%):  [30 %] 30 % (09/18 0755) Weight:  [91 kg (200 lb 9.9 oz)] 91 kg (200 lb 9.9 oz) (09/18 0447)  Weight change: 4.6 kg (10 lb 2.3 oz) Filed Weights   03/10/16 0457 03/11/16 0432 03/12/16 0447  Weight: 90.7 kg (199 lb 15.3 oz) 86.4 kg (190 lb 7.6 oz) 91 kg (200 lb 9.9 oz)    Intake/Output: I/O last 3 completed shifts: In: 4696.4 [I.V.:1417.2; Blood:690; QP/YP:9509.3; IV Piggyback:650] Out: 2500 [Other:2500]   Intake/Output this shift:  Total I/O In: 127.2 [I.V.:27.2; NG/GT:100] Out: -   Physical Exam: General: Critically ill  Head: +ETT, +OGT  Eyes: Eyes open  Neck: No mass  Lungs:  Coarse, ventilator dependent  Heart: Bradycardic in 40's, irregular  Abdomen:  Soft, nontender, obese  Extremities:  +++ peripheral edema.  Neurologic:  sedated, eyes open  Skin: No lesions  Access: Left upper arm AVF    Basic Metabolic Panel:  Recent Labs Lab 03/14/2016 1333 03/09/16 0458 03/10/16 0449 03/11/16 0418  NA 140 137 138 137  K 3.4* 3.0* 3.3* 4.0  CL 98* 97* 98* 98*  CO2 35* 31 29 33*  GLUCOSE 109* 170* 143* 137*  BUN 21* 23* 20 28*  CREATININE 2.67* 2.85* 2.42* 2.80*  CALCIUM 8.5* 8.0* 8.2* 9.4  MG  --   --   --  2.0  PHOS  --   --   --  2.4*    Liver Function Tests:  Recent Labs Lab 03/06/2016 1333  AST 35  ALT 63*  ALKPHOS 122  BILITOT 0.7  PROT 4.3*  ALBUMIN 1.5*   No results for input(s): LIPASE, AMYLASE in the last 168 hours. No results for input(s): AMMONIA in the last 168  hours.  CBC:  Recent Labs Lab 03/24/2016 1333 03/09/16 0458 03/10/16 0449 03/11/16 0418 03/11/16 1435 03/12/16 0836  WBC 17.3* 19.8* 11.0 7.6  --  15.5*  NEUTROABS 15.6*  --   --   --   --   --   HGB 9.6* 9.1* 7.4* 5.7* 9.5* 9.1*  HCT 29.8* 27.9* 22.4* 17.4* 28.5* 27.4*  MCV 97.0 96.8 94.8 95.7  --  92.0  PLT 93* 108* 94* 85*  --  61*    Cardiac Enzymes:  Recent Labs Lab 03/03/2016 1333 03/07/2016 1901  TROPONINI 0.04* 0.13*    BNP: Invalid input(s): POCBNP  CBG:  Recent Labs Lab 03/11/16 1559 03/11/16 1956 03/11/16 2333 03/12/16 0352 03/12/16 0717  GLUCAP 115* 42 72 103* 112*    Microbiology: Results for orders placed or performed during the hospital encounter of 03/06/2016  Blood culture (single)     Status: None (Preliminary result)   Collection Time: 02/27/2016  1:37 PM  Result Value Ref Range Status   Specimen Description BLOOD RIGHT AC  Final   Special Requests   Final    BOTTLES DRAWN AEROBIC AND ANAEROBIC AER 10ML ANA 9ML   Culture NO GROWTH 4 DAYS  Final   Report Status PENDING  Incomplete  MRSA PCR Screening  Status: None   Collection Time: 03/09/16 12:20 AM  Result Value Ref Range Status   MRSA by PCR NEGATIVE NEGATIVE Final    Comment:        The GeneXpert MRSA Assay (FDA approved for NASAL specimens only), is one component of a comprehensive MRSA colonization surveillance program. It is not intended to diagnose MRSA infection nor to guide or monitor treatment for MRSA infections.   Culture, blood (single) w Reflex to ID Panel     Status: None (Preliminary result)   Collection Time: 03/09/16 10:10 AM  Result Value Ref Range Status   Specimen Description BLOOD DRAWN BY DIALYSIS  Final   Special Requests BOTTLES DRAWN AEROBIC AND ANAEROBIC  11CC  Final   Culture NO GROWTH 3 DAYS  Final   Report Status PENDING  Incomplete    Coagulation Studies: No results for input(s): LABPROT, INR in the last 72 hours.  Urinalysis: No results for  input(s): COLORURINE, LABSPEC, PHURINE, GLUCOSEU, HGBUR, BILIRUBINUR, KETONESUR, PROTEINUR, UROBILINOGEN, NITRITE, LEUKOCYTESUR in the last 72 hours.  Invalid input(s): APPERANCEUR    Imaging: Dg Chest Port 1 View  Result Date: 03/12/2016 CLINICAL DATA:  Acute respiratory failure EXAM: PORTABLE CHEST 1 VIEW COMPARISON:  03/10/2016 FINDINGS: Endotracheal tube and NG tube remain in place, unchanged. Mild cardiomegaly. Vascular congestion. Diffuse right lung airspace disease with right effusion. No effusion on the left. No acute bony abnormality. IMPRESSION: Diffuse right lung airspace disease and right effusion, unchanged. Cardiomegaly, vascular congestion. Electronically Signed   By: Rolm Baptise M.D.   On: 03/12/2016 07:46   Dg Abd Portable 1v  Result Date: 03/11/2016 CLINICAL DATA:  Orogastric tube placement EXAM: PORTABLE ABDOMEN - 1 VIEW COMPARISON:  Portable exam 1158 hours compared to 02/24/2016 FINDINGS: Tip of endotracheal tube projects 2.2 cm above carina. Orogastric tube coiled in proximal stomach. External pacing leads project over abdomen and LEFT lower chest. Nonobstructive bowel gas pattern. Bibasilar pulmonary opacities question atelectasis versus infiltrate with small RIGHT pleural effusion. IMPRESSION: Orogastric tube coiled in proximal stomach. Electronically Signed   By: Lavonia Dana M.D.   On: 03/11/2016 12:11     Medications:   . DOPamine 3.5 mcg/kg/min (03/12/16 0851)  . epinephrine Stopped (03/11/16 2130)  . fentaNYL infusion INTRAVENOUS 25 mcg/hr (03/12/16 0800)  . norepinephrine Stopped (03/11/16 1000)  . vasopressin (PITRESSIN) infusion - *FOR SHOCK* Stopped (03/11/16 1100)   . albumin human  25 g Intravenous Q8H  . chlorhexidine gluconate (MEDLINE KIT)  15 mL Mouth Rinse BID  . docusate  100 mg Oral Daily  . famotidine (PEPCID) IV  20 mg Intravenous Q24H  . feeding supplement (VITAL HIGH PROTEIN)  1,000 mL Per Tube Q24H  . heparin  5,000 Units Subcutaneous Q8H   . hydrocortisone sod succinate (SOLU-CORTEF) inj  50 mg Intravenous Q6H  . insulin aspart  0-15 Units Subcutaneous Q4H  . mouth rinse  15 mL Mouth Rinse QID  . midodrine  10 mg Per Tube TID WC  . piperacillin-tazobactam (ZOSYN)  IV  3.375 g Intravenous Q12H  . sennosides  5 mL Oral BID  . sodium chloride flush  10-40 mL Intracatheter Q12H   sodium chloride, fentaNYL (SUBLIMAZE) injection, midazolam, sodium chloride flush  Assessment/ Plan:  Ms. Cheralyn Oliver is a 81 y.o. black female with atrial fibrillation, anemia, coronary artery disease, diabetes mellitus type 2, hypertension, hyperlipidemia, history of breast cancer  Patient on dialysis since march 2017  TTS [facility] [address]  1. End Stage Renal Disease:  patient with anasarca. Albumin at 1.5. Nephrotic syndrome. However malnutrion and sepsis are contributing.  Unable to get much ultrafiltration on previous treatments due to hypotension Overall prognosis is poor Continue IV albumin for now.  Palliative care discussions in progress Tentatively dialysis tomorrow   2. Hypotension- requiring vasopressors. Unclear source. Blood cultures no growth.  - Norepinephrine, dopamine - empiric vancomycin and zosyn.  - midodrine    3. Anemia of chronic kidney disease: gets mircera as outpatient. Received EPO on 9/16 - PRBC given 9/17  4. Anasarca - low albumin is contributing to 3rd spacing - UF is limited by hypotension   5. Acute resp faiure - ventilator dependent  6. Bradycardia - A Fib,  - recommend cardiology evaluation if family desires aggressive care   LOS: 4 Teresa Lutz 9/18/20179:01 AM

## 2016-03-12 NOTE — Progress Notes (Signed)
Nutrition Follow-up  DOCUMENTATION CODES:   Obesity unspecified  INTERVENTION:  -TF: recommend changing goal rate of Vital High Protein to 45 ml/hr with addition of Prostat BID providing 1280 kcals, 125 g of protein and 907 mL of free water. Recommend minimal additional free water flushes at present (20 mL q 4 hours per Adult Tube Feeding Protocol for tube maintenance only). Continue to assess  NUTRITION DIAGNOSIS:   Inadequate oral intake related to acute illness as evidenced by NPO status.  GOAL:   Provide needs based on ASPEN/SCCM guidelines  MONITOR:   TF tolerance, Weight trends  REASON FOR ASSESSMENT:   Ventilator    ASSESSMENT:    Pt remains on critically ill on vent, HD today, significant volume overload. On Dopamine. Pt actively dying per MD notes but family wanting continued medical care  Tolerating Vital High Protein at rate of 50 ml/hr  Diet Order:  Diet NPO time specified  Skin:  Reviewed, no issues  Last BM:  9/17   Labs: reviewed  Meds: reviewed  Height:   Ht Readings from Last 1 Encounters:  03/09/2016 5\' 5"  (1.651 m)    Weight:   Wt Readings from Last 1 Encounters:  03/12/16 200 lb 9.9 oz (91 kg)    Filed Weights   03/10/16 0457 03/11/16 0432 03/12/16 0447  Weight: 199 lb 15.3 oz (90.7 kg) 190 lb 7.6 oz (86.4 kg) 200 lb 9.9 oz (91 kg)    BMI:  Body mass index is 31.42 kg/m.  Estimated Nutritional Needs:   Kcal:  [id number] kcals/d  Protein:  114-142 g/d (Using IBW of 57kg)  Fluid:  1087ml + UOP  EDUCATION NEEDS:   No education needs identified at this time  [place], [place], [place] [phone] Pager  [phone] Weekend/On-Call Pager

## 2016-03-12 NOTE — Consult Note (Signed)
Chaplain paged to provide supportive presence to family as patient has support withdrawn.  Daughter and extended family at the bedside; all accepting and grieving appropriately.  Patient has multiple illnesses and has been in decline for some time; family is appreciative of all the care offered and has chosen terminal extubation and to allow patient to let go.  They are tearful but coping well.  Prayer, hymns and other spiritual means of coping with patient's death led by various members of family.  All appreciative of excellent care provided by staff.

## 2016-03-12 NOTE — Progress Notes (Addendum)
Patient experienced episode of profound hypoxia, Probation officer and CN thought patient was passing, family called into the room.  RT also called, vent alarming.  Patient was given 2 mg IV versed, VS improved, patient's daughter was called. Patient's skin temperature seemed to drop, feedings are turned off at this time.  Edema is worsening, pt is to have HD this evening, writer unsure if HD can be done at this time.  Dopamine is being titrated up to maintain HR > 60.

## 2016-03-12 NOTE — Consult Note (Signed)
Cardiology Consultation Note  Patient ID: Teresa Lutz, MRN: [id number], DOB/AGE: 1935-03-31 81 y.o. Admit date: 03/07/2016   Date of Consult: 03/12/2016 Primary Physician: Lavonia Dana, MD Primary Cardiologist: Dr. Fletcher Anon, MD Requesting Physician: Dr. Stevenson Clinch, MD  Chief Complaint: Weakness Reason for Consult: Bradycardia  HPI: 81 y.o. female with h/o mild nonobstructive CAD by cardiac cath in 2012, persistent Afib on Eliquis, chronic diastolic CHF, history of asymptomatic breadycardia, ESRD on HD, DM2, HTN, HLD, anemia of chronic disease, sleep apena on CPAP, hypothyroidism, chronic LE edema, and history of breast cancer who was admitted on 02/24/2016 with septic shock complicated by multiorgan failure with anasarca, and malnutrition, now a DNR though family wants continued medical care, and is actively dying. Cardiology is consulted for bradycardia.   She was most recently seen by Dr. Fletcher Anon, MD on 02/24/16 for follow up with stable dyspnea and without chest pain. She continued to note significant LE edema. Echo from 04/2015 shwoed normal LV systolic function, mildly dilated LA, and mildly elevated PASP at 41 mmHg. She was recently hospitalized in 08/2015 with acute on chronic diastolic CHF with massive volume overload in the setting of worsening renal function. She was also noted to be in Afib with RVR at that time. She was started on HD with subsequent improvement in volume status. Her ventricular rate was difficult to control at that time 2/2 soft BP. Her her last follow up she was noted to be bradycardic with a heart rate of 54 bpm, asymptomatic.   She initially presented for dialysis on 03/02/2016 and was noted to be hypotensive with SBP of 50/40. She was sent to the ED and remained hypotensive there requiring multiple IV fluid boluses to maintain an adequate BP. She was in acute respiratory failure with CXr shoing pulmonary edema with moderate right pleural effusion. Her IV fluids were limited. Her  hgb eventually dropped to 5.7 requiring pRBC transfusion. FOllow up CXR has continued to demonstrate diffuse right lung disease and right effusion. She was ultimately intubated on 03/09/16. Cultures have bene negative to date. She has been treated with vancomycin and Zosyn for infection. She does have a known sacral ducubetous ulcer. She required multiple pressors (Levophed, epi, and vasopressin from 9/15-9/17 for cardiopulmonary support. On 9/17 her heart rate dropped into the upper 20's to 30's with telemetry c/w junctional rhythm. Her heart rate improved to the 40's to 60's bpm, though dropped back down into the mid 30's bpm c/w junctional beats. She was started on dopamine (remaining pressors were discontinued) infusion in an effort to maintain BP and cardiopulmonary. She is currently in NSR, 60's bpm. She remains massively volume overloaded and hypotensive. Bradycardia has improved on dopamine. Currently in sinus rhythm with heart rate in the 70's bpm.   Past Medical History:  Diagnosis Date  . Anemia of chronic disease   . Arthritis 1990  . Breast cancer ([place]) 2012   Right stage 1  . Chronic atrial fibrillation ([place]) 2014   a. Eliquis 5 mg bid; CHADSVASc 5 (CHF, HTN, age x 2, female)  . Chronic diastolic CHF (congestive heart failure) (HCC)    a. EF 60-65% by echo in 04/2015  . CKD (chronic kidney disease), stage IV ([place])   . Goiter, specified as simple   . Hemorrhoid   . Hyperlipidemia   . Hypertension 1960  . Hypopotassemia   . Lower extremity edema   . Malignant neoplasm of upper-inner quadrant of female breast ([place])    right  . Pulmonary HTN (  [place])    a. PA peak pressure 41 mmHg by echo in 04/2015  . Ulcer 1980  . Unspecified hypothyroidism   . Unspecified vitamin D deficiency       Most Recent Cardiac Studies: As above   Surgical History:  Past Surgical History:  Procedure Laterality Date  . AV FISTULA PLACEMENT Left 09/02/2015   Procedure: INSERTION OF ARTERIOVENOUS (AV)  GORE-TEX GRAFT ARM;  Surgeon: Katha Cabal, MD;  Location: [facility] ORS;  Service: Vascular;  Laterality: Left;  . BREAST LUMPECTOMY Right 50 years ago  . BREAST LUMPECTOMY Right 2012   invasive   . CARDIAC CATHETERIZATION  06/2010   [facility]: Mild nonobstructive coronary artery disease with normal ejection fraction.  [name] CARDIOVERSION    . COLONOSCOPY  2011  . mammosite balloon placement  Right 11/2010  . PERIPHERAL VASCULAR CATHETERIZATION N/A 08/29/2015   Procedure: Dialysis/Perma Catheter Insertion;  Surgeon: Algernon Huxley, MD;  Location: [place] [facility];  Service: Cardiovascular;  Laterality: N/A;  . PERIPHERAL VASCULAR CATHETERIZATION N/A 02/20/2016   Procedure: Dialysis/Perma Catheter Removal;  Surgeon: Algernon Huxley, MD;  Location: [place] [facility];  Service: Cardiovascular;  Laterality: N/A;     Home Meds: Prior to Admission medications   Medication Sig Start Date End Date Taking? Authorizing Provider  acetaminophen (TYLENOL) 325 MG tablet Take 325 mg by mouth every 6 (six) hours as needed for mild pain.   Yes Historical Provider, MD  amiodarone (PACERONE) 200 MG tablet Take 1 tablet (200 mg total) by mouth daily. 02/24/16  Yes Wellington Hampshire, MD  apixaban (ELIQUIS) 2.5 MG TABS tablet Take 2.5 mg by mouth 2 (two) times daily.    Yes Historical Provider, MD  b complex-vitamin c-folic acid (NEPHRO-VITE) 0.8 MG TABS tablet Take 1 tablet by mouth daily.   Yes Historical Provider, MD  Besifloxacin HCl 0.6 % SUSP Place 1 drop into the left eye See admin instructions. Instill 4 times daily every 1 month starting on the last day of month for 2 days   Yes Historical Provider, MD  carvedilol (COREG) 3.125 MG tablet Take 1 tablet (3.125 mg total) by mouth 2 (two) times daily with a meal. 09/08/15  Yes Srikar Sudini, MD  ferrous sulfate 325 (65 FE) MG tablet Take 325 mg by mouth daily.    Yes Historical Provider, MD  guaiFENesin-codeine (ROBITUSSIN AC) 100-10 MG/5ML syrup Take 5 mLs by mouth every 6  (six) hours as needed for cough.   Yes Historical Provider, MD  letrozole (FEMARA) 2.5 MG tablet Take 2.5 mg by mouth daily.    Yes Historical Provider, MD  levothyroxine (SYNTHROID, LEVOTHROID) 25 MCG tablet Take 25 mcg by mouth daily.    Yes Historical Provider, MD  midodrine (PROAMATINE) 10 MG tablet Take 1 tablet (10 mg total) by mouth 3 (three) times a week. On Tue-Thur-Sat prior to Dialysis Patient taking differently: Take 10 mg by mouth 3 (three) times a week. Every Tuesday, Thursday, and Saturday, before dialysis 09/08/15  Yes Srikar Sudini, MD  mirtazapine (REMERON) 15 MG tablet Take 15 mg by mouth at bedtime.  02/21/16  Yes Historical Provider, MD  mupirocin ointment (BACTROBAN) 2 % Place 1 application into the nose 3 (three) times daily. Apply to left great toe   Yes Historical Provider, MD  sertraline (ZOLOFT) 50 MG tablet Take 50 mg by mouth daily.  02/06/16  Yes Historical Provider, MD  sevelamer carbonate (RENVELA) 800 MG tablet Take 800 mg by mouth 3 (three)  times daily with meals.   Yes Historical Provider, MD  Skin Protectants, Misc. (CALAZIME SKIN PROTECTANT EX) Apply 1 application topically 2 (two) times daily. Apply to sacral/buttocks   Yes Historical Provider, MD    Inpatient Medications:  . albumin human  25 g Intravenous Q8H  . chlorhexidine gluconate (MEDLINE KIT)  15 mL Mouth Rinse BID  . docusate  100 mg Oral Daily  . famotidine (PEPCID) IV  20 mg Intravenous Q24H  . feeding supplement (VITAL HIGH PROTEIN)  1,000 mL Per Tube Q24H  . heparin  5,000 Units Subcutaneous Q8H  . hydrocortisone sod succinate (SOLU-CORTEF) inj  50 mg Intravenous Q6H  . insulin aspart  0-15 Units Subcutaneous Q4H  . mouth rinse  15 mL Mouth Rinse QID  . midodrine  10 mg Per Tube TID WC  . sennosides  5 mL Oral BID  . sodium chloride flush  10-40 mL Intracatheter Q12H   . DOPamine 3.519 mcg/kg/min (03/12/16 1000)  . epinephrine Stopped (03/11/16 2130)  . fentaNYL infusion INTRAVENOUS 25  mcg/hr (03/12/16 1000)  . norepinephrine Stopped (03/11/16 1000)  . vasopressin (PITRESSIN) infusion - *FOR SHOCK* Stopped (03/11/16 1100)    Allergies:  Allergies  Allergen Reactions  . Celebrex [Celecoxib] Hives    Social History   Social History  . Marital status: Single    Spouse name: N/A  . Number of children: N/A  . Years of education: N/A   Occupational History  . Not on file.   Social History Main Topics  . Smoking status: Never Smoker  . Smokeless tobacco: Never Used  . Alcohol use No  . Drug use: No  . Sexual activity: Not on file   Other Topics Concern  . Not on file   Social History Narrative  . No narrative on file     Family History  Problem Relation Age of Onset  . Cancer Mother     ovarian  . Hypertension Mother   . Stroke Father   . Cancer Brother     colon     Review of Systems: Review of Systems  Unable to perform ROS: Intubated    Labs: No results for input(s): CKTOTAL, CKMB, TROPONINI in the last 72 hours. Lab Results  Component Value Date   WBC 15.5 (H) 03/12/2016   HGB 9.1 (L) 03/12/2016   HCT 27.4 (L) 03/12/2016   MCV 92.0 03/12/2016   PLT 61 (L) 03/12/2016    Recent Labs Lab 03/02/2016 1333  03/12/16 0836  NA 140  < > 136  K 3.4*  < > 4.3  CL 98*  < > 95*  CO2 35*  < > 29  BUN 21*  < > 37*  CREATININE 2.67*  < > 2.94*  CALCIUM 8.5*  < > 9.9  PROT 4.3*  --   --   BILITOT 0.7  --   --   ALKPHOS 122  --   --   ALT 63*  --   --   AST 35  --   --   GLUCOSE 109*  < > 124*  < > = values in this interval not displayed. Lab Results  Component Value Date   CHOL 133 08/24/2015   HDL 34 (L) 08/24/2015   LDLCALC 65 08/24/2015   TRIG 169 (H) 08/24/2015   No results found for: DDIMER  Radiology/Studies:  Dg Chest 1 View  Result Date: 03/09/2016 CLINICAL DATA:  81 year old female intubated. Initial encounter. EXAM: CHEST 1 VIEW COMPARISON:  1431 hours today. FINDINGS: Portable AP supine view at 2120 hours. Endotracheal  tube tip about 10 mm above the carina. Similar lung volumes. Stable cardiac size and mediastinal contours. Veiling opacity persists in the right lung suggesting pleural effusion. Smaller veiling opacity now on the left lung. No definite pneumothorax on this supine view. IMPRESSION: 1. Intubated with endotracheal tube tip about 10 mm from the carina. 2. Right greater than left pleural effusions. Associated right lung base atelectasis or consolidation. Electronically Signed   By: Genevie Ann M.D.   On: 03/04/2016 22:15   Dg Abd 1 View  Result Date: 02/26/2016 CLINICAL DATA:  Orogastric tube placement EXAM: ABDOMEN - 1 VIEW COMPARISON:  None. FINDINGS: The orogastric tube extends into the stomach, coiled in the fundus. IMPRESSION: OG tube extends into the stomach. Electronically Signed   By: Andreas Newport M.D.   On: 03/16/2016 23:55   Ct Head Wo Contrast  Result Date: 03/02/2016 CLINICAL DATA:  Recent fall, current blood thinner use EXAM: CT HEAD WITHOUT CONTRAST TECHNIQUE: Contiguous axial images were obtained from the base of the skull through the vertex without intravenous contrast. COMPARISON:  None. FINDINGS: Brain: No evidence of acute infarction, hemorrhage, hydrocephalus, extra-axial collection or mass lesion/mass effect. Mild atrophic changes are noted. Vascular: No hyperdense vessel or unexpected calcification. Skull: Normal. Negative for fracture or focal lesion. Mild hyperostosis is noted. Sinuses/Orbits: No acute finding. IMPRESSION: No acute intracranial abnormality noted. Mild atrophic changes are seen. Electronically Signed   By: Inez Catalina M.D.   On: 03/02/2016 07:32   Dg Chest Port 1 View  Result Date: 03/12/2016 CLINICAL DATA:  Acute respiratory failure EXAM: PORTABLE CHEST 1 VIEW COMPARISON:  03/10/2016 FINDINGS: Endotracheal tube and NG tube remain in place, unchanged. Mild cardiomegaly. Vascular congestion. Diffuse right lung airspace disease with right effusion. No effusion on the  left. No acute bony abnormality. IMPRESSION: Diffuse right lung airspace disease and right effusion, unchanged. Cardiomegaly, vascular congestion. Electronically Signed   By: Rolm Baptise M.D.   On: 03/12/2016 07:46   Dg Chest Port 1 View  Result Date: 03/10/2016 CLINICAL DATA:  Acute respiratory failure. Subsequent encounter. Intubated patient. EXAM: PORTABLE CHEST 1 VIEW COMPARISON:  None 15 17 FINDINGS: Moderate right pleural effusion. Central vascular congestion and interstitial thickening. Streaky lung base opacities. No new lung opacities. No pneumothorax. Endotracheal tube and nasal/ orogastric tube are well positioned. IMPRESSION: 1. No significant change from previous day's study. Moderate right pleural effusion. 2. Interstitial thickening streaky lung base opacity is likely combination of mild pulmonary edema and lung base atelectasis. Electronically Signed   By: Lajean Manes M.D.   On: 03/10/2016 07:16   Dg Chest Port 1 View  Result Date: 03/09/2016 CLINICAL DATA:  Dyspnea. EXAM: PORTABLE CHEST 1 VIEW COMPARISON:  Yesterday at 2120 hour FINDINGS: Endotracheal tube is 1.4 cm from the carina. Enteric tube in place, tip and side port below the diaphragm. Right pleural effusion is similar, left pleural effusion has improved. Associated bibasilar opacities, favoring atelectasis. Cardiomediastinal contours are unchanged. Chronic degenerative change of both shoulders. IMPRESSION: Improved left pleural effusion. Unchanged right pleural effusion. Bibasilar opacities are again seen. Endotracheal and enteric tubes in place. Electronically Signed   By: Jeb Levering M.D.   On: 03/09/2016 04:10   Dg Chest Port 1 View  Result Date: 03/22/2016 CLINICAL DATA:  Shortness of breath. [place] out of bed onto the floor 1 week ago. EXAM: PORTABLE CHEST 1 VIEW COMPARISON:  08/22/2015. FINDINGS: Mildly enlarged cardiac  silhouette with a mild increase in size. Interval moderate-sized right pleural effusion. Interval  patchy and linear density in both mid lung zones. Interval airspace opacity at the medial left lung base. Diffuse osteopenia. Thoracic spine degenerative changes. IMPRESSION: 1. Moderate-sized right pleural effusion. 2. Bilateral atelectasis and possible pneumonia. 3. Mild cardiomegaly with mild progression. Electronically Signed   By: Claudie Revering M.D.   On: 03/05/2016 14:54   Dg Abd Portable 1v  Result Date: 03/11/2016 CLINICAL DATA:  Orogastric tube placement EXAM: PORTABLE ABDOMEN - 1 VIEW COMPARISON:  Portable exam 1158 hours compared to 03/04/2016 FINDINGS: Tip of endotracheal tube projects 2.2 cm above carina. Orogastric tube coiled in proximal stomach. External pacing leads project over abdomen and LEFT lower chest. Nonobstructive bowel gas pattern. Bibasilar pulmonary opacities question atelectasis versus infiltrate with small RIGHT pleural effusion. IMPRESSION: Orogastric tube coiled in proximal stomach. Electronically Signed   By: Lavonia Dana M.D.   On: 03/11/2016 12:11   Dg Hip Unilat W Or Wo Pelvis 2-3 Views Right  Result Date: 03/09/2016 CLINICAL DATA:  Right hip pain after falling out of bed 1 week ago. EXAM: DG HIP (WITH OR WITHOUT PELVIS) 2-3V RIGHT COMPARISON:  None. FINDINGS: Diffuse osteopenia. No fracture or dislocation seen. Extensive arterial calcifications. Small calcified uterine fibroids on the right. Lower lumbar spine degenerative changes. IMPRESSION: 1. No fracture or dislocation. 2. Lower lumbar spine degenerative changes. 3. Extensive atheromatous arterial calcifications. Electronically Signed   By: Claudie Revering M.D.   On: 03/06/2016 14:51   Mm Outside Films Mammo  Result Date: 03/07/2016 This examination belongs to an outside facility and is stored here for comparison purposes only.  Contact the originating outside institution for any associated report or interpretation.   EKG: Interpreted by me showed: NSR, 68 bpm, LBBB Telemetry: Interpreted by me showed: currently  NSR, 70's bpm, with episodes of junctional rhythm with heart rates in the 30's to 40's bpm  Weights: Filed Weights   03/10/16 0457 03/11/16 0432 03/12/16 0447  Weight: 199 lb 15.3 oz (90.7 kg) 190 lb 7.6 oz (86.4 kg) 200 lb 9.9 oz (91 kg)     Physical Exam: Blood pressure (!) 145/94, pulse 72, temperature 99.1 F (37.3 C), resp. rate 14, height 5' 5"  (1.651 m), weight 200 lb 9.9 oz (91 kg), SpO2 99 %. Body mass index is 31.42 kg/m. General: Critically ill appearing. Head: Normocephalic, atraumatic, sclera non-icteric, no xanthomas, nares are without discharge.  Neck: Negative for carotid bruits. JVD unable to be assessed. Lungs: Decreased breath sounds bilaterally. Intubated.  Heart: RRR with S1 S2. No murmurs, rubs, or gallops appreciated. Abdomen: Soft, non-tender, distended with normoactive bowel sounds. No hepatomegaly. No rebound/guarding. No obvious abdominal masses. Msk:  Strength and tone appear normal for age. Extremities: No clubbing or cyanosis. 3+ edema.  Neuro: Intubated and sedated. Psych:  Intubated and sedated.    Assessment and Plan:  Principal Problem:   Acute respiratory failure (HCC) Active Problems:   Acute on chronic diastolic (congestive) heart failure (HCC)   End stage renal disease (HCC)   Sepsis (HCC)   Multiple organ failure   Junctional rhythm   Lower extremity edema   Chronic atrial fibrillation (HCC)   Pulmonary HTN (HCC)   Anemia of chronic disease    1. Bradycardia/junctional escape rhythm/Afib: -Currently in sinus rhythm with heart rates in the 70's bpm -Continue pressor support, currently on dopamine. (Previously on Levophed, epi, and vasopressin),  wean if/as able -Pacer pads (family wants continued medical  support) -No indication for emergent trip to the cardiac cath lab at this time for venous temp wire as she is now in sinus rhythm with a heart rate in the 70's bpm -Likely in the setting of her acute illness -New LBBB, unable to  assess for symptoms -Check echo -Hold AV node blocking medications and Eliquis  2. Acute respiratory failure: -Intubated 03/09/16 -Likely in the setting of volume overload -Wean O2 if/as able. Suspect this may be difficult   3. Multi-organ failure: -Patient is likely actively dying -Overall prognosis very poor -Palliative care has been consulted to discuss goals of care (currently DNR, family wants continued medical care) -No family in the room at this time to discuss goals of care  4. Acute on chronic diastolic CHF/volume overload/anasarca/malnutrition: -Massively volume overloaded -Unable to remove fluid at this time 2/2 hypotension  -Dialysis for volume management when able  5. ESRD on HD: -Possible dialysis 9/19 per renal  6. Septic shock: -Source unclear -Empiric vancomycin and Zosyn -Per primary  7. Anasarca/hypoalbuminemia: -Malnutrition  -IV albumin  8. Acute on chronic anemia of disease: -Status post transfusion x 2 of pRBC -Maintain hgb > 8   Signed, Marcille Blanco [facility] Pager: [phone] 03/12/2016, 11:09 AM

## 2016-03-12 NOTE — Progress Notes (Addendum)
[facility] Consultation      Name: Teresa Lutz MRN: [id number] DOB: 08/07/1934    ADMISSION DATE:  02/28/2016   CHIEF COMPLAINT:  hypotension.    HISTORY OF PRESENT ILLNESS:   The patient is a 81 yo female with a history of ESRD. She had her permacath removed on 8/28 due to working dialysis fistula. She has a history of afib and diast CHF, anemia of chronic disease, sleep apnea on CPAP, hypothyroidism, chronic leg edema and history of breast cancer. She was recently seen in the ER due to fall and hitting of the head, nothing significant was found, then the patient was discharged.   She was seen at HD today and it was noted that she was hypotensive. Her BP was 50/40, she was not dialyzed and sent to the ER. While in the ER she has been persistently hypertensive and required boluses of fluid to keep her BP elevated.  During that time she has become volume overloaded and she is now requiring a 100% NRB.   Currently on my eval, she is not able to give a history due to dyspnea, she is in trendelenburg to keep her BP elevated. She is not able to give history due to dyspnea and lying nearly upside down.   SUBJECTIVE: Acute issues overnight. Still requiring low-dose pressor. Started on dopamine for low heart rate with improvement in heart rate from the 30s to 40s  Hosp Course 9/15 remains intubated,sedated, full vent support 9/16 remains intubated 9/17 near cardiac arrest x2, low HR, low BP, hbf 5 Patient in process of dying. I discussed with daughter Teresa Lutz, patient is now DNR, but family wants full medical care  Allergies  Allergen Reactions  . Celebrex [Celecoxib] Hives   REVIEW OF SYSTEMS:   Unable to provide due to critical illness.    VITAL SIGNS: Temp:  [98.2 F (36.8 C)-99.9 F (37.7 C)] 99.3 F (37.4 C) (09/18 0600) Pulse Rate:  [35-53] 46 (09/18 0800) Resp:  [12-19] 15 (09/18 0800) BP: (68-147)/(46-93) 133/62 (09/18 0755) SpO2:  [88 %-100 %]  100 % (09/18 0800) FiO2 (%):  [30 %] 30 % (09/18 0755) Weight:  [200 lb 9.9 oz (91 kg)] 200 lb 9.9 oz (91 kg) (09/18 0447) HEMODYNAMICS:   VENTILATOR SETTINGS: Vent Mode: PRVC FiO2 (%):  [30 %] 30 % Set Rate:  [12 bmp] 12 bmp Vt Set:  [450 mL] 450 mL PEEP:  [5 cmH20] 5 cmH20 Plateau Pressure:  [18 cmH20] 18 cmH20 INTAKE / OUTPUT:  Intake/Output Summary (Last 24 hours) at 03/12/16 0834 Last data filed at 03/12/16 0800  Gross per 24 hour  Intake          3356.12 ml  Output                0 ml  Net          3356.12 ml    Physical Examination:   VS: BP 133/62 (BP Location: Right Leg)   Pulse (!) 46   Temp 99.3 F (37.4 C)   Resp 15   Ht 5\' 5"  (1.651 m)   Wt 200 lb 9.9 oz (91 kg)   SpO2 100%   BMI 31.42 kg/m   General Appearance:gcs<8T, intubated Neuro:without focal findings, mental status reduced.  HEENT: PERRLA, EOM intact, no ptosis. Pulmonary: bilateral crackles.  CardiovascularNormal S1,S2.  No m/r/g.    Abdomen: Benign, Soft, non-tender. Renal:  No costovertebral tenderness  GU:  Not performed at this time. Endoc:  No evident thyromegaly, no signs of acromegaly. Skin:   warm, no rashes, no ecchymosis  Extremities: 0.5cm Stage II left heal ulcer, b/l LE edema   LABS: Reviewed   LABORATORY PANEL:   CBC  Recent Labs Lab 03/11/16 0418 03/11/16 1435  WBC 7.6  --   HGB 5.7* 9.5*  HCT 17.4* 28.5*  PLT 85*  --     Chemistries   Recent Labs Lab 03/20/2016 1333  03/11/16 0418  NA 140  < > 137  K 3.4*  < > 4.0  CL 98*  < > 98*  CO2 35*  < > 33*  GLUCOSE 109*  < > 137*  BUN 21*  < > 28*  CREATININE 2.67*  < > 2.80*  CALCIUM 8.5*  < > 9.4  MG  --   --  2.0  PHOS  --   --  2.4*  AST 35  --   --   ALT 63*  --   --   ALKPHOS 122  --   --   BILITOT 0.7  --   --   < > = values in this interval not displayed.   Recent Labs Lab 03/11/16 1146 03/11/16 1559 03/11/16 1956 03/11/16 2333 03/12/16 0352 03/12/16 0717  GLUCAP 155* 115* 70 72 103* 112*     Recent Labs Lab 03/09/16 0052 03/10/16 0436 03/11/16 0500  PHART 7.65* 7.62* 7.51*  PCO2ART 30* 34 47  PO2ART 198* 179* 131*    Recent Labs Lab 03/16/2016 1333  AST 35  ALT 63*  ALKPHOS 122  BILITOT 0.7  ALBUMIN 1.5*    Cardiac Enzymes  Recent Labs Lab 03/11/2016 1901  TROPONINI 0.13*    RADIOLOGY:  Dg Chest Port 1 View  Result Date: 03/12/2016 CLINICAL DATA:  Acute respiratory failure EXAM: PORTABLE CHEST 1 VIEW COMPARISON:  03/10/2016 FINDINGS: Endotracheal tube and NG tube remain in place, unchanged. Mild cardiomegaly. Vascular congestion. Diffuse right lung airspace disease with right effusion. No effusion on the left. No acute bony abnormality. IMPRESSION: Diffuse right lung airspace disease and right effusion, unchanged. Cardiomegaly, vascular congestion. Electronically Signed   By: Rolm Baptise M.D.   On: 03/12/2016 07:46   Dg Abd Portable 1v  Result Date: 03/11/2016 CLINICAL DATA:  Orogastric tube placement EXAM: PORTABLE ABDOMEN - 1 VIEW COMPARISON:  Portable exam 1158 hours compared to 03/10/2016 FINDINGS: Tip of endotracheal tube projects 2.2 cm above carina. Orogastric tube coiled in proximal stomach. External pacing leads project over abdomen and LEFT lower chest. Nonobstructive bowel gas pattern. Bibasilar pulmonary opacities question atelectasis versus infiltrate with small RIGHT pleural effusion. IMPRESSION: Orogastric tube coiled in proximal stomach. Electronically Signed   By: Lavonia Dana M.D.   On: 03/11/2016 12:11     ASSESSMENT/PLAN   81 yo female with history of ESRD, now with septic shock and hypotension. Complicated by volume overload with acute resp failure.  Near cardiac arrest with multiorgan failure  PULMONARY A:Acute respiratory failure.  OSA. P:   Continue vent support  CARDIOVASCULAR A: Severe septic shock with hypotension.  Volume overload acute on chronic.  Severe sinus bradycardia P:  Given ESRD, will limit IVF, Wean off  vasopressors as tolerated  Keep MAP>65.  Stop epinephrine and start dopamine  RENAL A:  ESRD on HD.  P:   Hold HDper nephrology   GASTROINTESTINAL A:  Nothing by mouth status  P:  Tube feeds per per protocol  H2-blocker for GI prophylaxis   HEMATOLOGIC A:   Anemia  of chronic disease-hemoglobin 5 on 03/11/16; transfused 2 units of packed red blood cells  P:  Follow up a.m. labs Transfuse as needed  if hemoglobin less than 7  INFECTIOUS A:  Septic shock.  Stage II L heal ulcer P:   Check Blood cx, monitor cultures. Source uncertain +sacral decub.  -wound care consult  Micro/culture results:  BCx2 -- UC -- Sputum--  Antibiotics: Zosyn 03/04/2016>> Vancomycin. 03/23/2016>>  ENDOCRINE - ICU hypoglycemic\Hyperglycemia protocol    NEUROLOGIC A:  Acute encephalopathy and confusion secondary to multiorgan failure  P: - intubated and sedated - minimal sedation to achieve a RASS goal: -1  Disposition and family update: No family at bedside. We'll updated when available. We reviewed care goals with family. Given patient's overall poor prognosis   Best Practices  DVT Prophylaxis: hold Heparin SQ due to low HGB and bleeding. GI Prophylaxis: Famotidine  Magdalene S. Tukov ANP-BC Pulmonary and [place] Pager [phone] or [phone]  STAFF NOTE: I, Dr. Vilinda Boehringer have personally reviewed patient's available data, including medical history, events of note, physical examination and test results as part of my evaluation. I have discussed with resident/NP NP Patria Mane and other care providers such as pharmacist, RN and RRT.  In addition,  I personally evaluated patient and elicited key findings of   HPI:  81 year old female with history of end-stage renal disease on dialysis for the past 6 months, had PermCath removal on 8/28 due to working dialysis fistula. Noted to have history of atrial fibrillation diastolic heart failure with anemia of  chronic disease, sleep apnea on CPAP and chronic leg edema and history of breast cancer, 4 years ago. Recently had her HD session noted to be hypotensive, 50/40, she was not dialyzed into the ER, noted to be persistently hypotensive in the ER, developing worsening respiratory distress, fluid overload and requiring 100% nonrebreather followed by intubation.  O:  GEN-critically ill, lethargic on mechanical ventilator  HEENT-- PERRLA CVS-s1, s2, RRR LUNGS-coarse upper airway sounds,  On MV ABD-soft, NT, ND, +BS MSK-+2 LE edema   Recent Labs CBC Latest Ref Rng & Units 03/12/2016 03/11/2016 03/11/2016  WBC 3.6 - 11.0 K/uL 15.5(H) - 7.6  Hemoglobin 12.0 - 16.0 g/dL 9.1(L) 9.5(L) 5.7(L)  Hematocrit 35.0 - 47.0 % 27.4(L) 28.5(L) 17.4(L)  Platelets 150 - 440 K/uL 61(L) - 85(L)      Recent Labs BMP Latest Ref Rng & Units 03/12/2016 03/11/2016 03/10/2016  Glucose 65 - 99 mg/dL 124(H) 137(H) 143(H)  BUN 6 - 20 mg/dL 37(H) 28(H) 20  Creatinine 0.44 - 1.00 mg/dL 2.94(H) 2.80(H) 2.42(H)  BUN/Creat Ratio 11 - 26 - - -  Sodium 135 - 145 mmol/L 136 137 138  Potassium 3.5 - 5.1 mmol/L 4.3 4.0 3.3(L)  Chloride 101 - 111 mmol/L 95(L) 98(L) 98(L)  CO2 22 - 32 mmol/L 29 33(H) 29  Calcium 8.9 - 10.3 mg/dL 9.9 9.4 8.2(L)       (The following images and results were reviewed by Dr. Stevenson Clinch on 03/12/2016). Dg Chest Port 1 View  Result Date: 03/12/2016 CLINICAL DATA:  Acute respiratory failure EXAM: PORTABLE CHEST 1 VIEW COMPARISON:  03/10/2016 FINDINGS: Endotracheal tube and NG tube remain in place, unchanged. Mild cardiomegaly. Vascular congestion. Diffuse right lung airspace disease with right effusion. No effusion on the left. No acute bony abnormality. IMPRESSION: Diffuse right lung airspace disease and right effusion, unchanged. Cardiomegaly, vascular congestion. Electronically Signed   By: Rolm Baptise M.D.   On: 03/12/2016 07:46  Dg Abd Portable 1v  Result Date: 03/11/2016 CLINICAL DATA:  Orogastric  tube placement EXAM: PORTABLE ABDOMEN - 1 VIEW COMPARISON:  Portable exam 1158 hours compared to 02/28/2016 FINDINGS: Tip of endotracheal tube projects 2.2 cm above carina. Orogastric tube coiled in proximal stomach. External pacing leads project over abdomen and LEFT lower chest. Nonobstructive bowel gas pattern. Bibasilar pulmonary opacities question atelectasis versus infiltrate with small RIGHT pleural effusion. IMPRESSION: Orogastric tube coiled in proximal stomach. Electronically Signed   By: Lavonia Dana M.D.   On: 03/11/2016 30:79    A:81 year old female history of ESRD on HD, now with septic shock and hypotension on dopamine, hospitalization, gated by fluid overload and acute respiratory failure requiring intubation.  End-stage renal disease on hemodialysis Sepsis Fluid overload Respiratory failure requiring mechanical ventilation Bradycardia requiring dopamine  Hypotension requiring pressors   P:   - Family at bedside, had discussion with them regarding goals of care, patient is DO NOT RESUSCITATE/DO NOT INTUBATE, but family wish to pursue with full medical treatment including dialysis and pressors. -Currently on dopamine 3.5 g, with a blood pressure in the 160s, heart rate in the 70s, continue dopamine as tolerated -Cardiology eval  .  Rest per NP/medical resident whose note is outlined above and that I agree with  The patient is critically ill with multiple organ systems failure and requires high complexity decision making for assessment and support, frequent evaluation and titration of therapies, application of advanced monitoring technologies and extensive interpretation of multiple databases.   Critical Care Time devoted to patient care services described in this note is  35 Minutes.   This time reflects time of care of this signee Dr Vilinda Boehringer.  This critical care time does not reflect procedure time, or teaching time or supervisory time of PA/NP/Med-student/Med Resident  etc but could involve care discussion time.  Vilinda Boehringer, MD  Pulmonary and Critical Care Pager [phone] (please enter 7-digits) On Call Pager [phone] (please enter 7-digits)  Note: This note was prepared with Dragon dictation along with smaller phrase technology. Any transcriptional errors that result from this process are unintentional.

## 2016-03-12 NOTE — Progress Notes (Signed)
Pt terminally extubated at 2155. Family and clergy at bedside.

## 2016-03-12 NOTE — Progress Notes (Signed)
Dr Stevenson Clinch informed of distended abdomen, port DG XR ordered

## 2016-03-12 NOTE — Progress Notes (Signed)
Pharmacy Antibiotic Note  Teresa Lutz is a 81 y.o. female admitted on 02/28/2016 with sepsis.  Pharmacy has been consulted for Zosyn dosing.  Plan:   AFter discussion with Dr. Stevenson Clinch   Height: 5\' 5"  (165.1 cm) Weight: 200 lb 9.9 oz (91 kg) IBW/kg (Calculated) : 57  Temp (24hrs), Avg:99.3 F (37.4 C), Min:98.4 F (36.9 C), Max:99.9 F (37.7 C)   Recent Labs Lab 03/16/2016 1333 03/19/2016 1901 03/09/16 0458 03/10/16 0449 03/11/16 0418 03/12/16 0836  WBC 17.3*  --  19.8* 11.0 7.6 15.5*  CREATININE 2.67*  --  2.85* 2.42* 2.80* 2.94*  LATICACIDVEN 2.3* 1.4  --   --   --   --     Estimated Creatinine Clearance: 17.3 mL/min (by C-G formula based on SCr of 2.94 mg/dL (H)).    Allergies  Allergen Reactions  . Celebrex [Celecoxib] Hives    Antimicrobials this admission: 9/14 Zosyn >>  9/14 Vancomycin >> 9/15  Dose adjustments this admission:   Microbiology results: 9/14 BCx: pending  Thank you for allowing pharmacy to be a part of this patient's care.  Larene Beach, PharmD  Clinical Pharmacist 03/12/2016 12:12 PM

## 2016-03-12 NOTE — Consult Note (Addendum)
[facility] Nurse wound consult note Reason for Consult:Moisture Associated SKin damage to buttocks and sacrum.  Will continue to offload pressure and apply skin barrier cream.  Keeping skin clean and dry.  Is on a group 2 offloading bed surface.  1 cm necrotic area to left great toe, dry and intact, stable eschar 0.5 cm denuded left heel.  Stage 2 pressure injury. Silicone border foam dressing in place. Will change every 3 days and PRN soilage per protocol.  Wound type:mositure associated skin damage.   Pressure Ulcer POA: Yes Dressing procedure/placement/frequency: Keep sacrum and buttocks clean and dry.  Barrier cream each shift.  Will not follow at this time.  Please re-consult if needed.  Domenic Moras RN BSN [place] Pager [phone]

## 2016-03-12 NOTE — Progress Notes (Signed)
Palliative Medicine consult noted. Due to high referral volume, there may be a delay seeing this patient. Please call the Palliative Medicine Team office at [phone] if recommendations are needed in the interim.  Thank you for inviting [place] to see this patient.  Marjie Skiff Zaraya Delauder, RN, BSN, [facility] 03/12/2016 8:56 AM Cell [phone] 8:00-4:00 Monday-Friday Office [phone]

## 2016-03-13 DIAGNOSIS — R69 Illness, unspecified: Secondary | ICD-10-CM

## 2016-03-13 DIAGNOSIS — D638 Anemia in other chronic diseases classified elsewhere: Secondary | ICD-10-CM

## 2016-03-13 LAB — CULTURE, BLOOD (SINGLE): CULTURE: NO GROWTH

## 2016-03-13 LAB — TYPE AND SCREEN
ABO/RH(D): O POS
Antibody Screen: NEGATIVE
UNIT DIVISION: 0
Unit division: 0

## 2016-03-14 LAB — CULTURE, BLOOD (SINGLE): CULTURE: NO GROWTH

## 2016-03-25 NOTE — Progress Notes (Signed)
Monitor shows asystole. Pt pronounced at [facility]. Family at bedside.

## 2016-03-25 NOTE — Discharge Summary (Signed)
DEATH SUMMARY  Name: Teresa Lutz MRN:   [id number] DOB:   1934-08-18          ADMISSION DATE:  03/07/2016 CHIEF COMPLAINT:  hypotension.    [name] was a 81 yo female with hx of ESRD, A. fib, diastolic congestive heart failure, anemia of chronic disease, sleep apnea, hypothyroidism and history of breast cancer 4 years ago.   Patient came to [facility] on 9/14 with hypotension  where she received multiple fluid boluses which ultimately put her in in volume overload. Patient was placed on BiPAP. Patient was severely dyspneic due to fluid overload and required 100% nonrebreather followed by intubation. Her initial hypotension was thought to be due to septic shock and she was started on empiric antibiotics with blood cultures drawn (currently negative). Over the course of her stay in the hospital her condition worsened and goals of care were discussed with the family. Patient at first was made DO NOT RESUSCITATE and later upon discussing her poor prognosis with Daughter- [name], she decided that she wants her mother to be terminally extubated and made comfortable.  Daughter stated that since 2022/09/28 patient has been slowly declining for a healthcare perspective.  Patient was terminally extubated on 09/27/53.  Patient was pronounced dead at [facility] on 18-Mar-2023. Chaplain was called  at bedside to provide supportive care.  Problem List: 1. End stage Renal disease 2  Multiple Organ Failure 3. Severe Septic shock 4. Acute Respiratory failure due to pulmonary edema. 5. Diastolic Congestive Heart Failure.   Time Of Death: 0020 Date of Death  : March 17, 2016   Bincy Varughese,AG-ACNP Pulmonary & Critical Care  Vilinda Boehringer, MD [place] Office ph#: [phone]

## 2016-03-25 DEATH — deceased

## 2016-03-28 ENCOUNTER — Other Ambulatory Visit: Payer: Medicare HMO

## 2016-03-28 ENCOUNTER — Ambulatory Visit: Payer: Medicare HMO

## 2016-04-30 ENCOUNTER — Ambulatory Visit: Payer: Medicare HMO | Admitting: Cardiovascular Disease

## 2016-06-08 ENCOUNTER — Other Ambulatory Visit: Payer: Self-pay | Admitting: Nurse Practitioner

## 2017-03-07 IMAGING — DX DG CHEST 1V
1 series · 1 of 1 positions shown · non-contrast
Comparison: 0590 hours today.

CLINICAL DATA: 81-year-old female intubated. Initial encounter.

EXAM:
CHEST 1 VIEW

[chest ap]
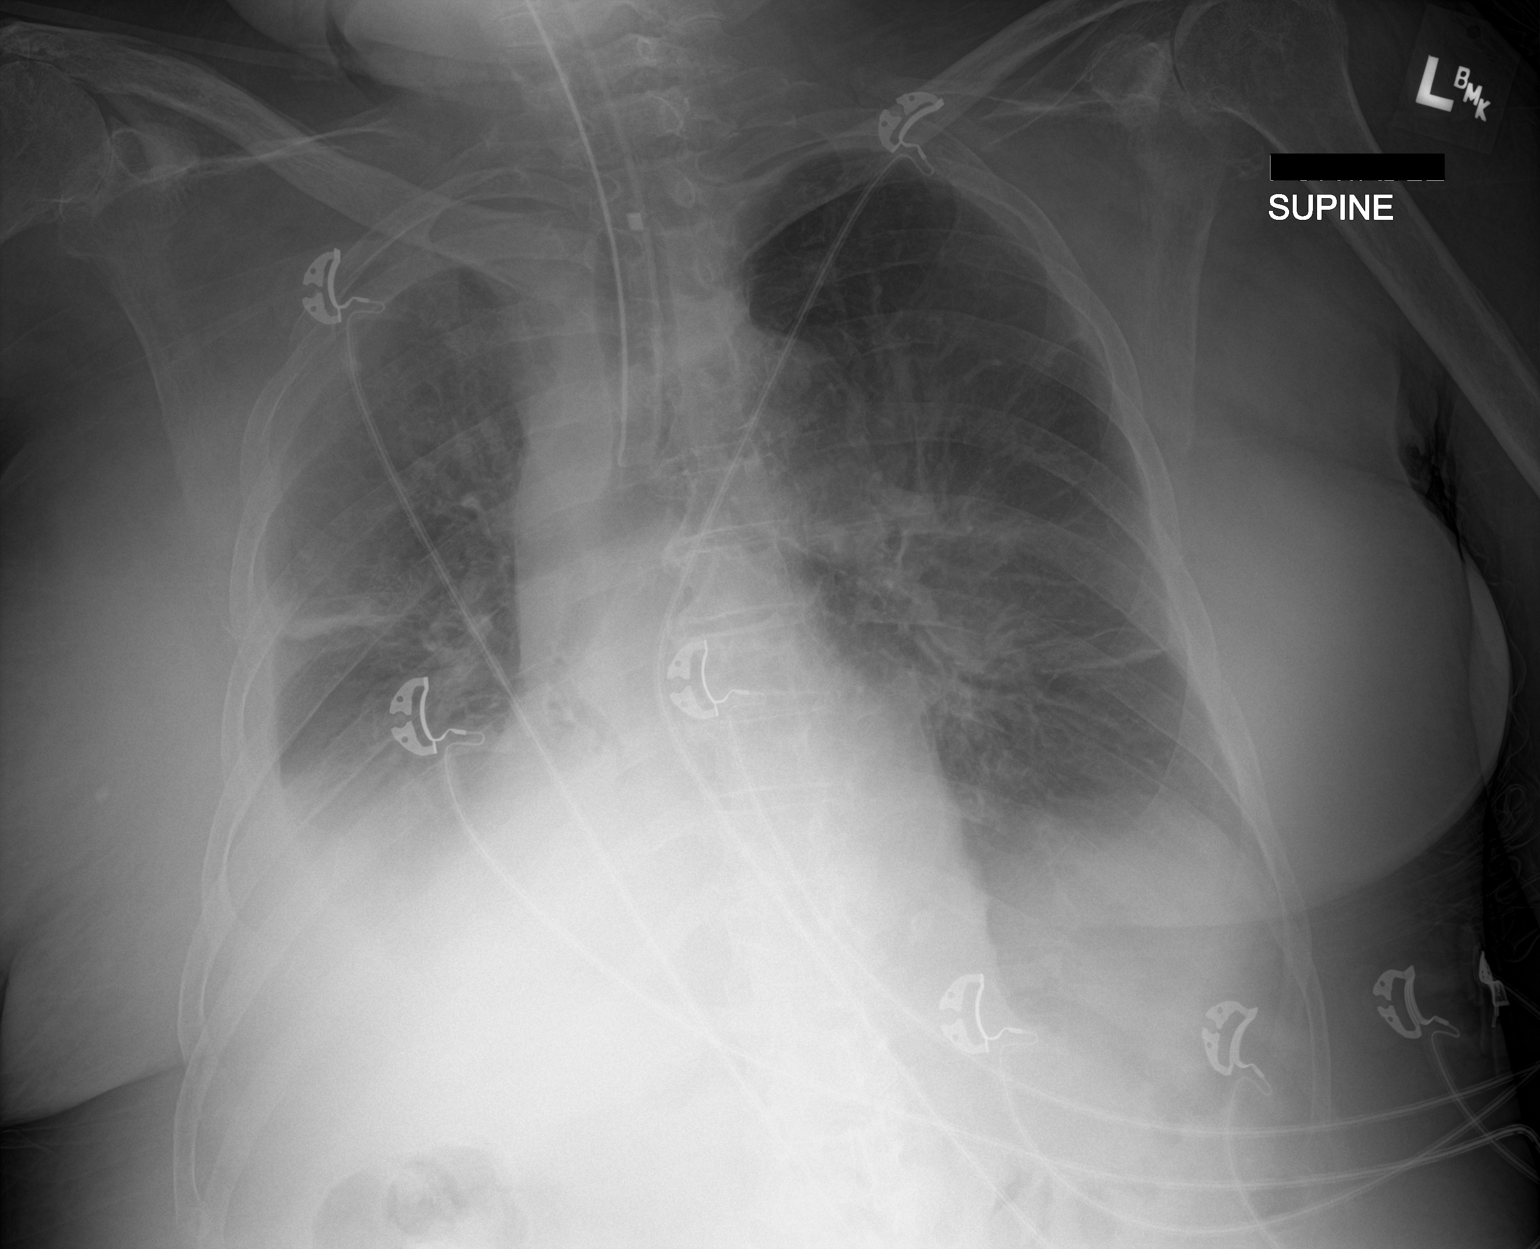

[1 of 1 positions shown; findings below may reference images not displayed]

FINDINGS: Portable AP supine view at 6269 hours. Endotracheal tube tip about
10 mm above the carina. Similar lung volumes. Stable cardiac size
and mediastinal contours. Veiling opacity persists in the right lung
suggesting pleural effusion. Smaller veiling opacity now on the left
lung. No definite pneumothorax on this supine view.
IMPRESSION: 1. Intubated with endotracheal tube tip about 10 mm from the carina.
2. Right greater than left pleural effusions. Associated right lung
base atelectasis or consolidation.

## 2017-03-07 IMAGING — CR DG CHEST 1V PORT
1 series · 1 of 1 positions shown · non-contrast
Comparison: 08/22/2015.

CLINICAL DATA: Shortness of breath. Fell out of bed onto the floor
1 week ago.

EXAM:
PORTABLE CHEST 1 VIEW

[dg chest port 1 view]
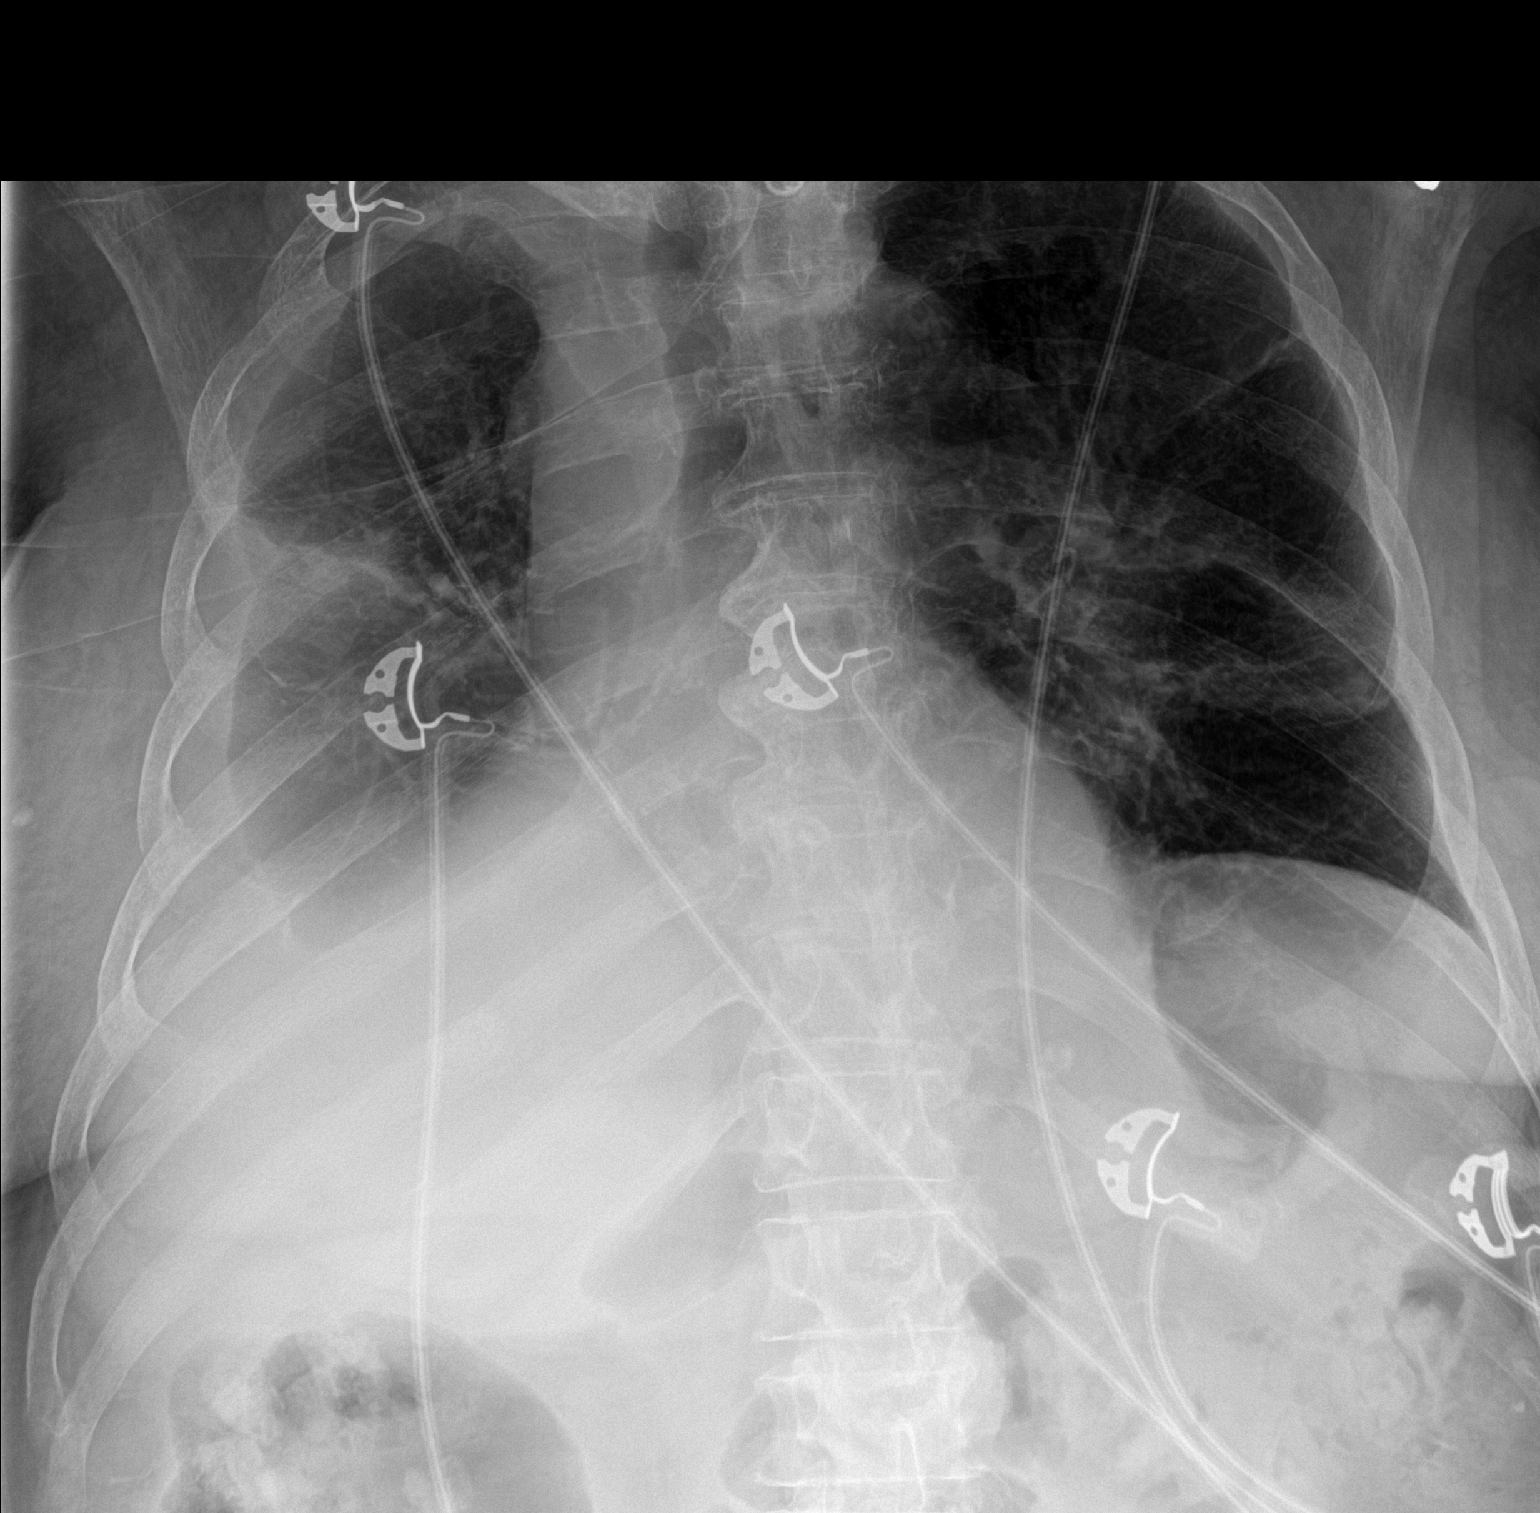

[1 of 1 positions shown; findings below may reference images not displayed]

FINDINGS: Mildly enlarged cardiac silhouette with a mild increase in size.
Interval moderate-sized right pleural effusion. Interval patchy and
linear density in both mid lung zones. Interval airspace opacity at
the medial left lung base. Diffuse osteopenia. Thoracic spine
degenerative changes.
IMPRESSION: 1. Moderate-sized right pleural effusion.
2. Bilateral atelectasis and possible pneumonia.
3. Mild cardiomegaly with mild progression.

## 2017-03-08 IMAGING — DX DG CHEST 1V PORT
1 series · 1 of 1 positions shown · non-contrast
Comparison: Yesterday at 0609 hour

CLINICAL DATA: Dyspnea.

EXAM:
PORTABLE CHEST 1 VIEW

[chest ap]
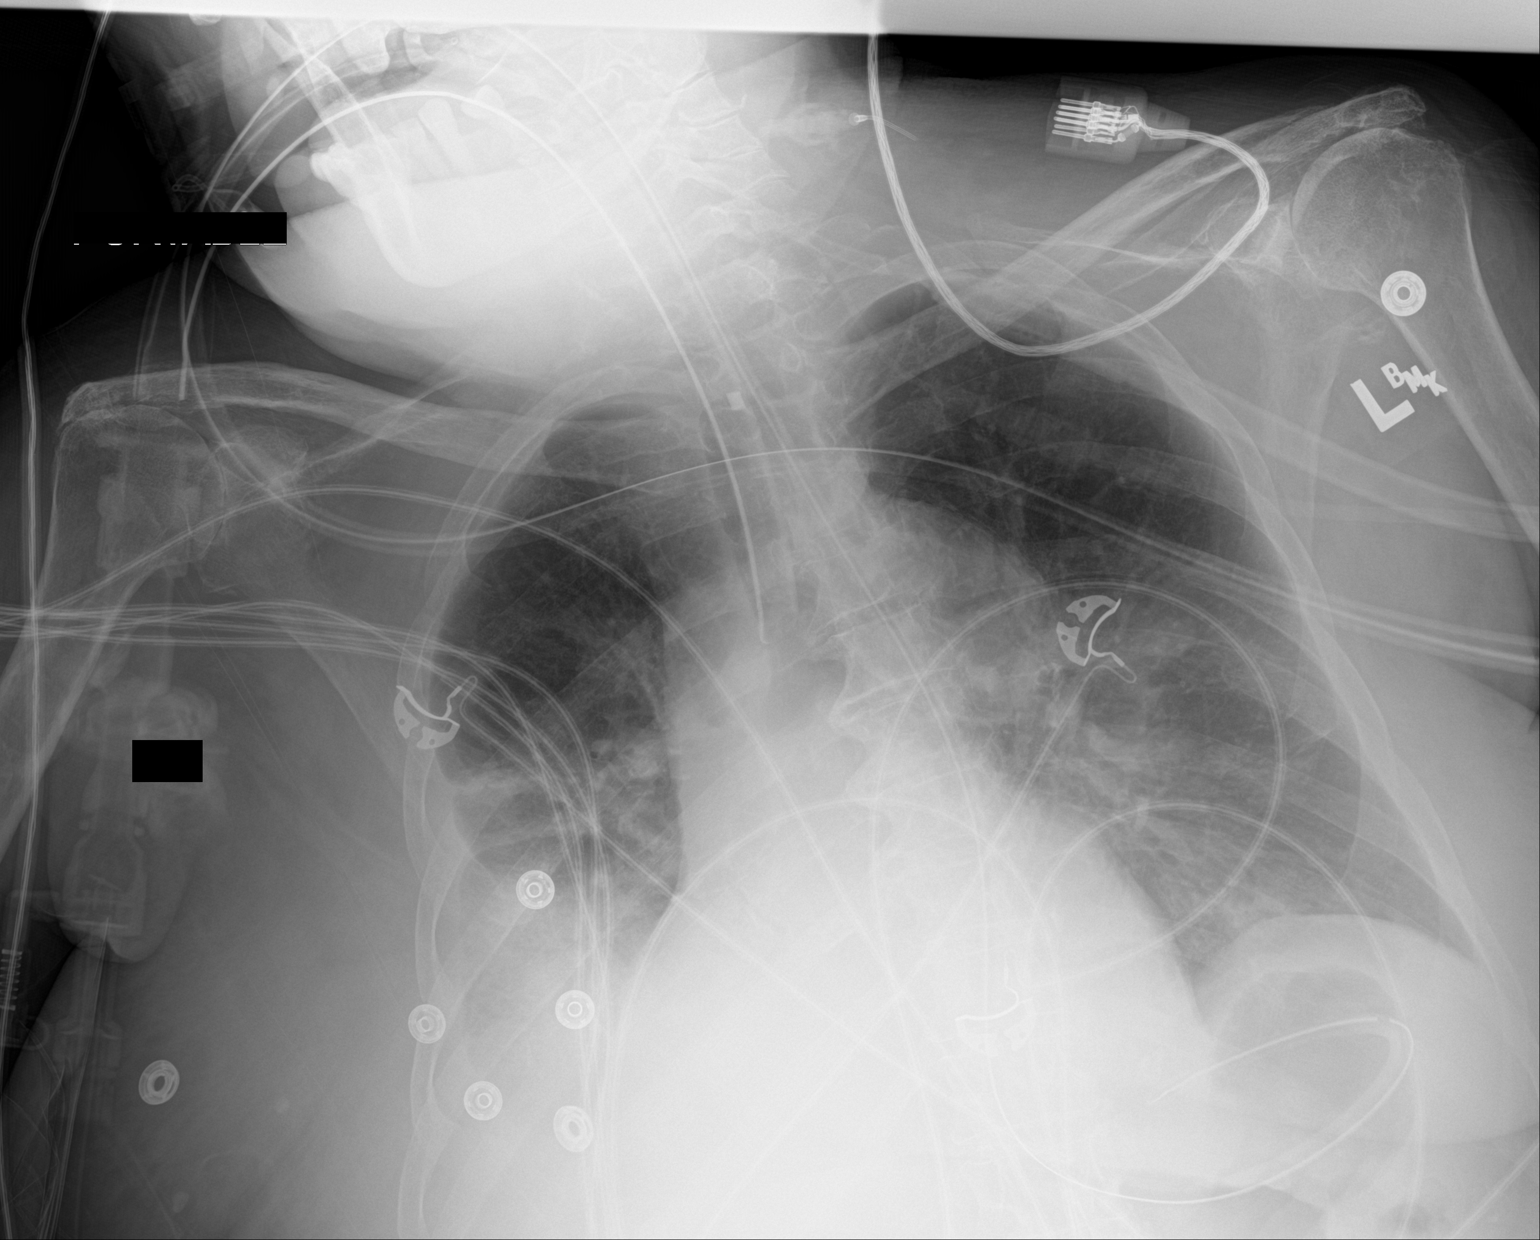

[1 of 1 positions shown; findings below may reference images not displayed]

FINDINGS: Endotracheal tube is 1.4 cm from the carina. Enteric tube in place,
tip and side port below the diaphragm. Right pleural effusion is
similar, left pleural effusion has improved. Associated bibasilar
opacities, favoring atelectasis. Cardiomediastinal contours are
unchanged. Chronic degenerative change of both shoulders.
IMPRESSION: Improved left pleural effusion. Unchanged right pleural effusion.
Bibasilar opacities are again seen.

Endotracheal and enteric tubes in place.

## 2017-03-09 IMAGING — DX DG CHEST 1V PORT
1 series · 1 of 1 positions shown · non-contrast
Comparison: None 15 17

CLINICAL DATA: Acute respiratory failure. Subsequent encounter.
Intubated patient.

EXAM:
PORTABLE CHEST 1 VIEW

[chest ap]
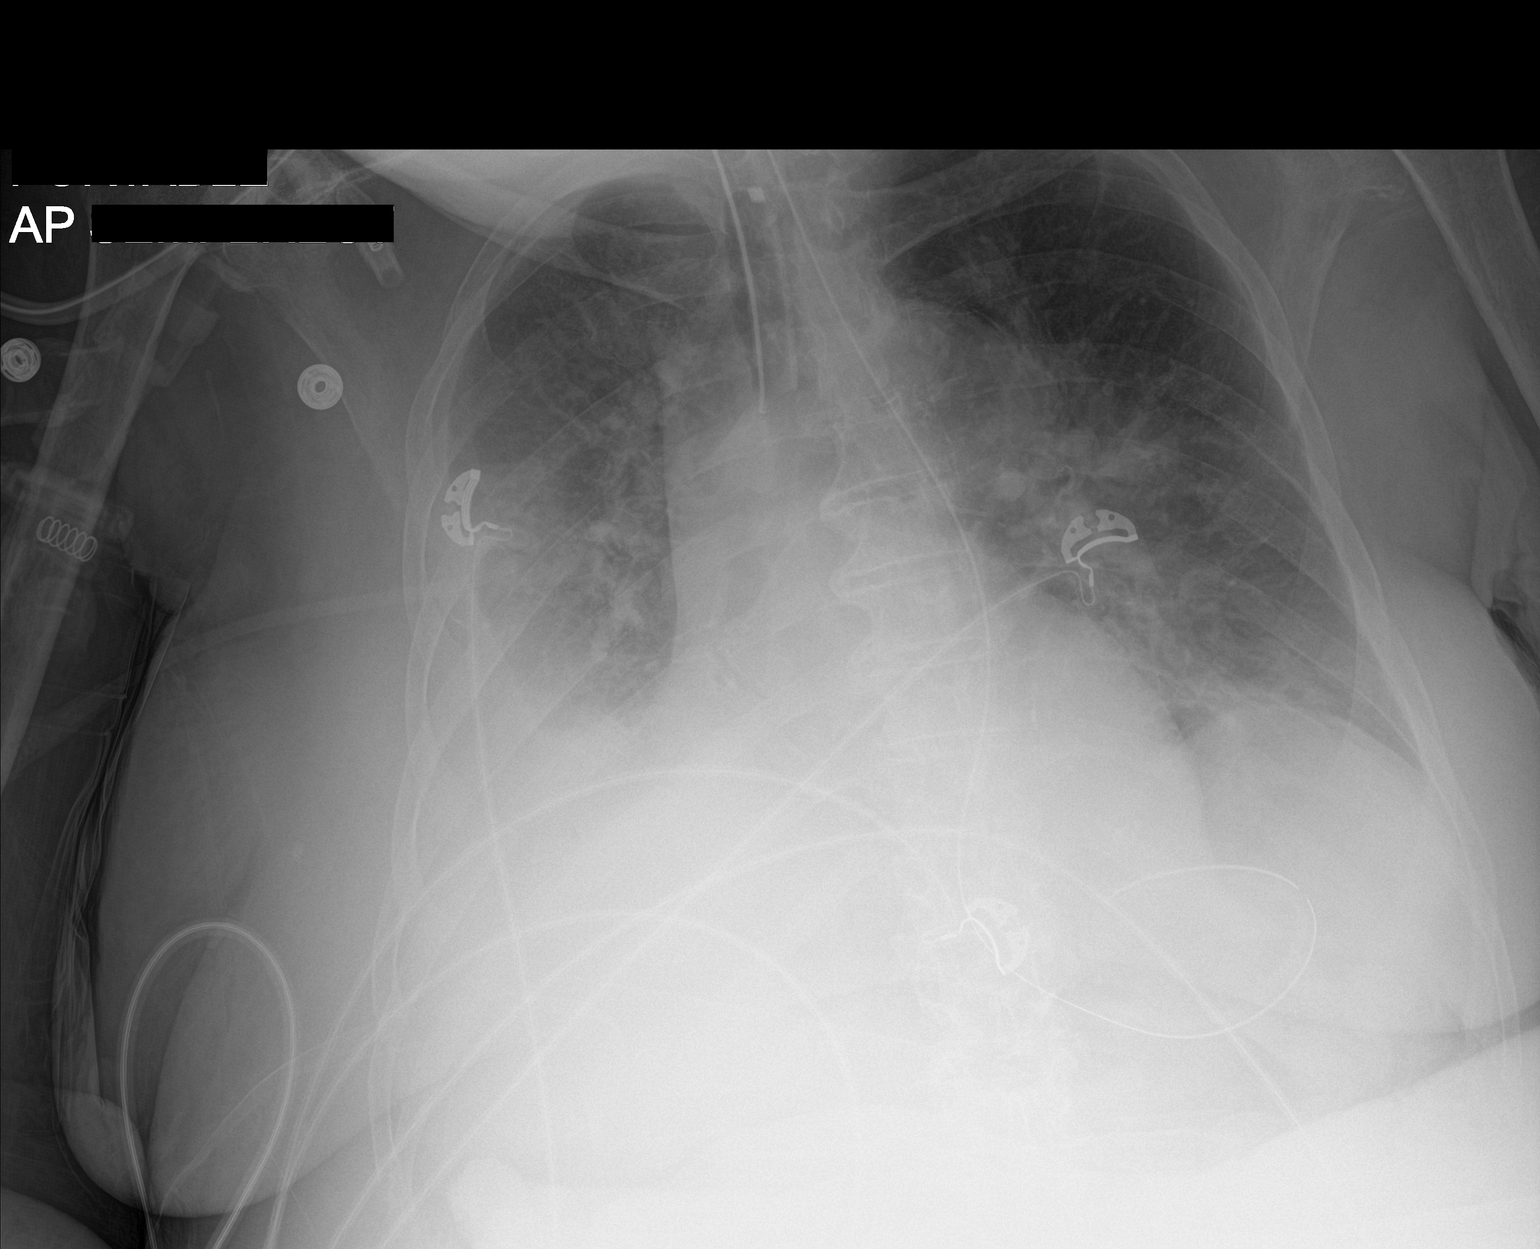

[1 of 1 positions shown; findings below may reference images not displayed]

FINDINGS: Moderate right pleural effusion. Central vascular congestion and
interstitial thickening. Streaky lung base opacities. No new lung
opacities. No pneumothorax.

Endotracheal tube and nasal/ orogastric tube are well positioned.
IMPRESSION: 1. No significant change from previous day's study. Moderate right
pleural effusion.
2. Interstitial thickening streaky lung base opacity is likely
combination of mild pulmonary edema and lung base atelectasis.

## 2017-03-10 IMAGING — DX DG ABD PORTABLE 1V
1 series · 1 of 1 positions shown · non-contrast
Comparison: Portable exam 5574 hours compared to 03/08/2016

CLINICAL DATA: Orogastric tube placement

EXAM:
PORTABLE ABDOMEN - 1 VIEW

[abdomen kub]
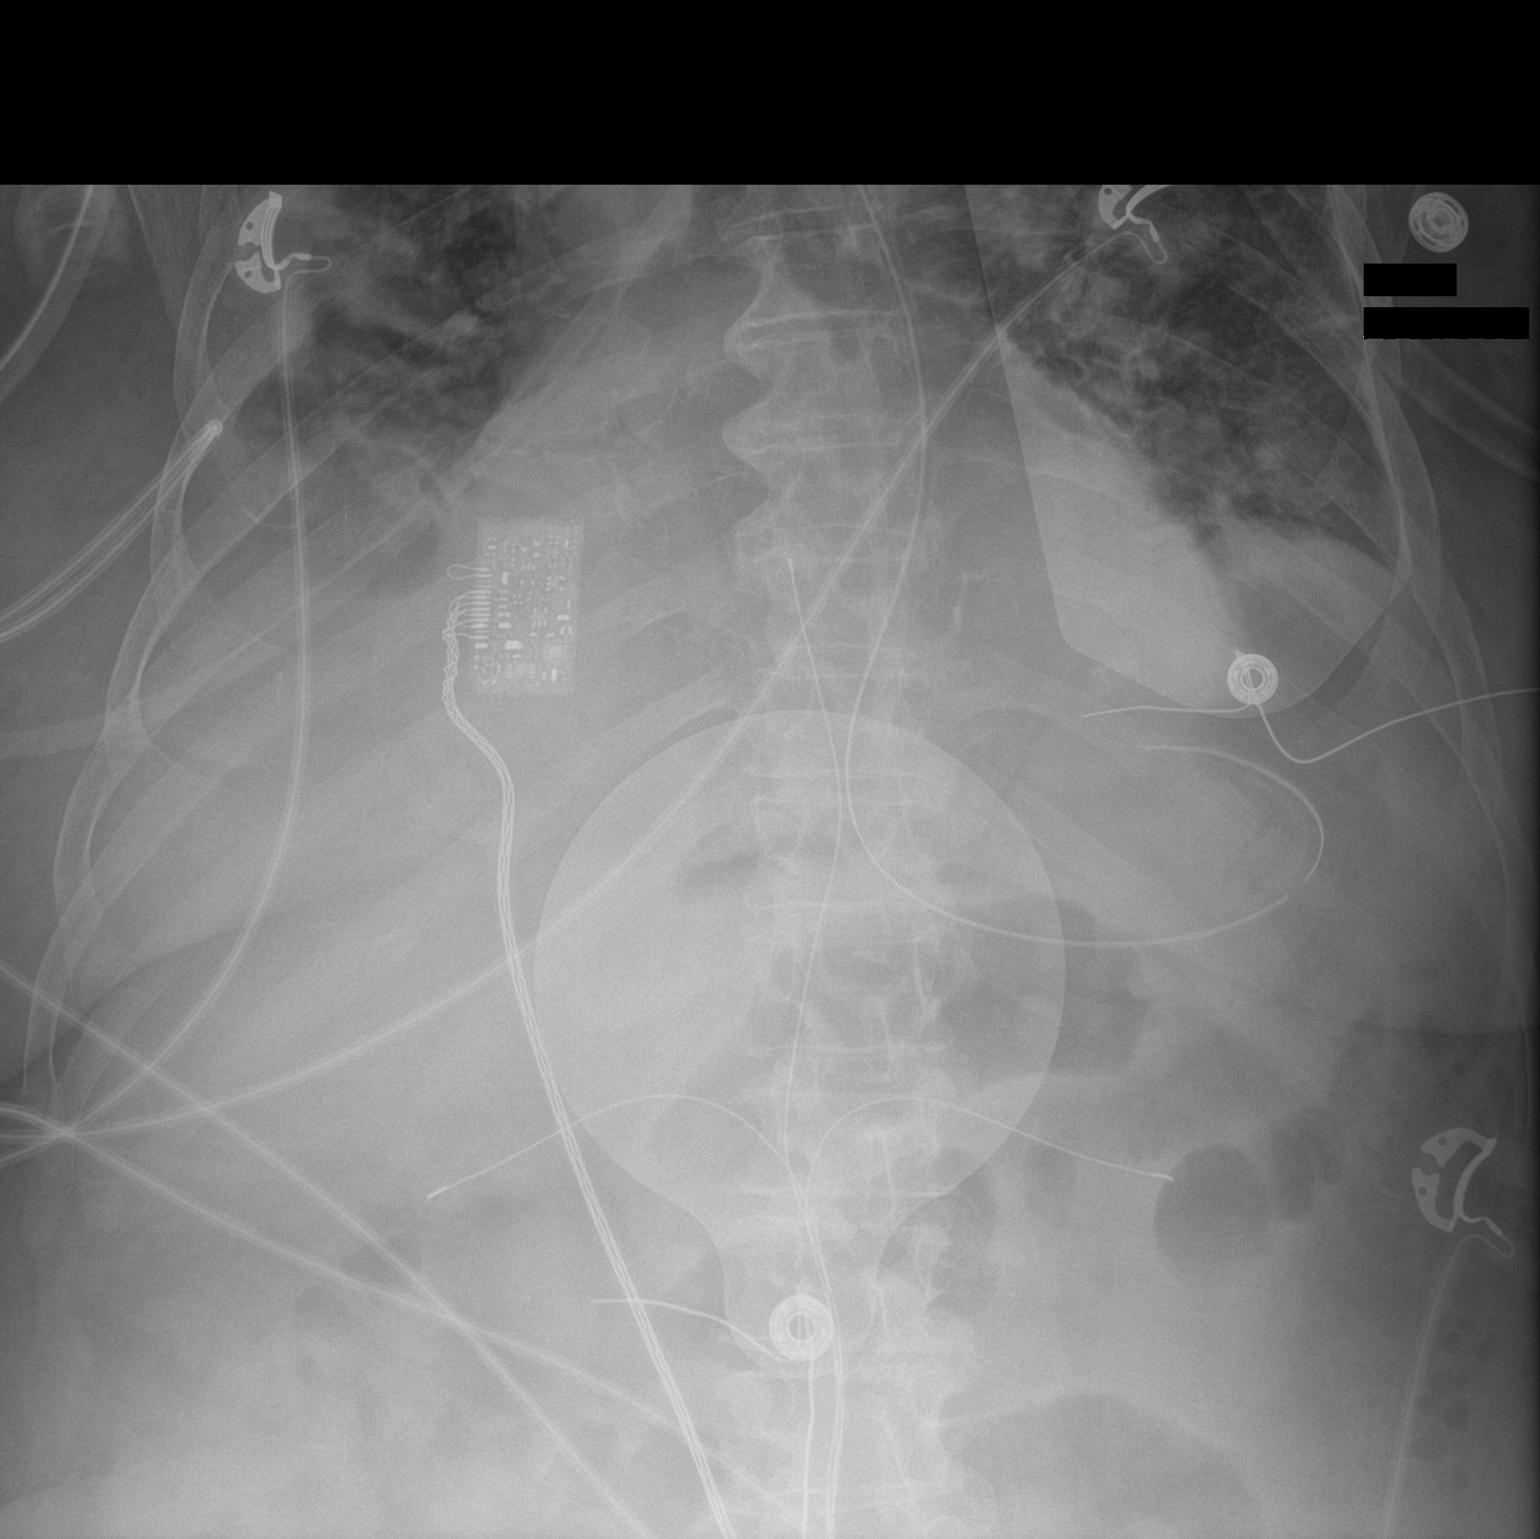

[1 of 1 positions shown; findings below may reference images not displayed]

FINDINGS: Tip of endotracheal tube projects 2.2 cm above carina.

Orogastric tube coiled in proximal stomach.

External pacing leads project over abdomen and LEFT lower chest.

Nonobstructive bowel gas pattern.

Bibasilar pulmonary opacities question atelectasis versus infiltrate
with small RIGHT pleural effusion.
IMPRESSION: Orogastric tube coiled in proximal stomach.
